# Patient Record
Sex: Female | Born: 1950 | Race: White | Hispanic: No | Marital: Married | Smoking: Former smoker
Health system: Southern US, Community
[De-identification: ages and names within clinical notes are randomized; demographics above are authoritative.]

## PROBLEM LIST (undated history)

## (undated) ENCOUNTER — Emergency Department: Discharge status: Absent without leave | Payer: Medicare Other

## (undated) DIAGNOSIS — R002 Palpitations: Secondary | ICD-10-CM

## (undated) DIAGNOSIS — J189 Pneumonia, unspecified organism: Secondary | ICD-10-CM

## (undated) DIAGNOSIS — I10 Essential (primary) hypertension: Secondary | ICD-10-CM

## (undated) DIAGNOSIS — E669 Obesity, unspecified: Secondary | ICD-10-CM

## (undated) DIAGNOSIS — I4891 Unspecified atrial fibrillation: Secondary | ICD-10-CM

## (undated) DIAGNOSIS — E039 Hypothyroidism, unspecified: Secondary | ICD-10-CM

## (undated) DIAGNOSIS — I499 Cardiac arrhythmia, unspecified: Secondary | ICD-10-CM

## (undated) DIAGNOSIS — J45909 Unspecified asthma, uncomplicated: Secondary | ICD-10-CM

## (undated) DIAGNOSIS — E785 Hyperlipidemia, unspecified: Secondary | ICD-10-CM

## (undated) HISTORY — PX: TONSILLECTOMY: SUR1361

## (undated) HISTORY — DX: Hyperlipidemia, unspecified: E78.5

## (undated) HISTORY — DX: Unspecified atrial fibrillation: I48.91

## (undated) HISTORY — PX: DILATION AND CURETTAGE OF UTERUS: SHX78

## (undated) HISTORY — PX: ANKLE FRACTURE SURGERY: SHX122

## (undated) HISTORY — DX: Hypothyroidism, unspecified: E03.9

## (undated) HISTORY — PX: TUBAL LIGATION: SHX77

## (undated) HISTORY — DX: Essential (primary) hypertension: I10

## (undated) HISTORY — DX: Obesity, unspecified: E66.9

## (undated) HISTORY — DX: Unspecified asthma, uncomplicated: J45.909

## (undated) HISTORY — PX: FRACTURE SURGERY: SHX138

## (undated) HISTORY — PX: ABDOMINAL HYSTERECTOMY: SHX81

## (undated) HISTORY — DX: Palpitations: R00.2

---

## 2010-04-04 ENCOUNTER — Encounter: Admission: RE | Admit: 2010-04-04 | Discharge: 2010-04-04 | Payer: Self-pay | Admitting: *Deleted

## 2010-04-21 ENCOUNTER — Encounter: Admission: RE | Admit: 2010-04-21 | Discharge: 2010-04-21 | Payer: Self-pay | Admitting: *Deleted

## 2010-10-08 ENCOUNTER — Other Ambulatory Visit
Admission: RE | Admit: 2010-10-08 | Discharge: 2010-10-08 | Payer: Self-pay | Source: Home / Self Care | Admitting: Internal Medicine

## 2012-03-04 ENCOUNTER — Encounter (HOSPITAL_COMMUNITY): Payer: Self-pay | Admitting: Pharmacy Technician

## 2012-03-04 ENCOUNTER — Other Ambulatory Visit: Payer: Self-pay | Admitting: Cardiology

## 2012-03-07 NOTE — H&P (Signed)
Office Visit     Patient: Carrie Potter, Carrie Potter Provider: Donielle Radziewicz, MD  DOB: 05/12/1951 Age: 61 Y Sex: Female Date: 03/03/2012  Phone: [phone]   Address: [address], [place], [place]  Pcp: CAMMIE FULP       Subjective:     CC:    1. 3WK FOLLOW UP.        HPI:  General:  61 year old that I saw originally in 2011 with palpitations after helping her child move into [employer] dorm room, dehydrated. She felt terrible with this prior episode. Holter and ECHO were reassuring. Today she sees me after being diagnosed with atrial fibrillatoin for the first time. ECG personally viewed from 02/09/12 shows afib. Today on exam she is in afib. Exacerbating factors include allergies after working in the garden as well as Sudafed. No CP, no SOB, no fevers. In fact, she has no palpitations currently. Prior episode in 2011 was described once again as fast rapid HR. (Likely an AFIB RVR). No DM, no CVA, no signif. ETOH, no PVD, normal EF, No CHF. +Female and HTN (score of 2 for CHADS-VAS). On 5/29 - placed her on Xarelto in anticapation of DC CV in one month. Gave her option of TEE/CV but since no symptoms will wait. Rationale is to give her a chance at NSR. Also started dilt CD 120mg.  On March 03, 2012-he continues to be in atrial fibrillation on auscultation. She's taking her medications well without any complaints. We discussed cardioversion. I discussed the pros and cons. We discussed that ablation is usually left a symptomatic atrial fibrillation. She's not interested in ablation. we decided to proceed with cardioversion. She is asymptomatic. She does feel some short windedness when walking up stairs..        ROS:  No bleeding problems, no syncope, no prior issues with anesthesia.       Medical History: hypothyroidism S/Potter RAI for nodule - endocrine, Asthma, HTN, Hyperlipidemia.        Surgical History: hysterectomy 2003, right ankle 2001, Hysterectomy , Tonsilectomy , D & C x 2 .       Family History: Father: deceased 80 yrs COPD, heart disease - rhythm issues 58, pacemaker, Skin Cancer Mother: deceased 83 yrs breast cancer, HTN, Borderline Diabetes, Skin Cancer Paternal Grand Father: deceased Natural Causes Paternal Grand Mother: deceased Natural Causes Maternal Grand Father: deceased Natural Causes Maternal Grand Mother: deceased Natural Causes Brother 1: alive No known medical problems Sister 1: alive COPD 11 brother(s) .        Social History:  General:  History of smoking cigarettes: Former smoker.  no Smoking, quit >35 years ago, college years ago.  no Tobacco Exposure.  Alcohol: yes, 1-2 per week, wine.  Caffeine: yes, 1-2 servings per day.  no Recreational drug use.  Exercise: yes, walks dog daily.  Occupation: employed, Coordinator.  Marital Status: married, Husband CNA.  Children: 3.        Medications: Detrol 2 MG Tablet 1 tablet Once a day, Lisinopril-Hydrochlorothiazide 20-12.5 MG Tablet 1 tablet Once a day, Levothyroxine Sodium 100 MCG Tablet 1 tablet Once a day, Valacyclovir HCl 1 GM Tablet 1 tablet q 12hrs prn, ProAir HFA 108 (90 Base) MCG/ACT Aerosol Solution 2 puffs as needed As needed, Xarelto 20 mg 20 tablet 1 tablet once a day, Cardizem CD 120 MG Capsule Extended Release 24 Hour 1 capsule Once a day, Medication List reviewed and reconciled with the patient         Allergies: penicillin: hives: Allergy.       Objective:     Vitals: Wt 226, Wt change 2.4 lb, Ht 63, BMI 40.03, Pulse sitting 68, BP sitting 112/70.       Examination:  Cardiology, General:  GENERAL APPEARANCE: pleasant, NAD.  HEENT: unremarkable.  HEART SOUNDS: irregularly irregular.  MURMUR: absent.  LUNGS: no rales or wheezes.  ABDOMEN: soft, non tender, positive bowel sounds, no masses felt.  EXTREMITIES: no leg edema.  PERIPHERAL PULSES: 2 plus bilateral.  Prior lab work reviewed.       Assessment:     Assessment:  1. Atrial fibrillation - 427.31 (Primary)  2.  Anticoagulant monitoring - V58.61    Plan:     1. Atrial fibrillation  LAB: Basic Metabolic    GLUCOSE 103  70-99 - mg/dL H   BUN 23  6-26 - mg/dL    CREATININE 1.03  0.60-1.30 - mg/dl    eGFR (NON-AFRICAN AMERICAN) 55 >60 - calc L   eGFR (AFRICAN AMERICAN) 66 >60 - calc    SODIUM 138  136-145 - mmol/L    POTASSIUM 4.9  3.5-5.5 - mmol/L    CHLORIDE 99  98-107 - mmol/L    C02 33  22-32 - mg/dL H   ANION GAP 10.8 6.0-20.0 - mmol/L    CALCIUM 10.0  8.6-10.3 - mg/dL     Demareon Coldwell 03/03/2012 02:38:34 PM > okay for cardioversion    LAB: CBC with Diff    WBC 9.0 4.0-11.0 - K/ul    RBC 4.01 4.20-5.40 - M/uL L   HGB 13.9 12.0-16.0 - g/dL    HCT 42.3 37.0-47.0 - %    MCH 34.7 27.0-33.0 - pg H   MPV 8.2 7.5-10.7 - fL    MCV 105.5 81.0-99.0 - fL H   MCHC 32.9 32.0-36.0 - g/dL    RDW 12.7 11.5-15.5 - %    NRBC# 0.00 -    PLT 221 150-400 - K/uL    NEUT % 66.5 43.3-71.9 - %    NRBC% 0.00 - %    LYMPH% 21.9 16.8-43.5 - %    MONO % 9.7 4.6-12.4 - %    EOS % 1.4 0.0-7.8 - %    BASO % 0.5 0.0-1.0 - %    NEUT # 6.0 1.9-7.2 - K/uL    LYMPH# 2.00 1.10-2.70 - K/uL    MONO # 0.9 0.3-0.8 - K/uL H   EOS # 0.1 0.0-0.6 - K/uL    BASO # 0.0 0.0-0.1 - K/uL     Tearsa Kowalewski 03/03/2012 02:38:34 PM > okay for cardioversion    LAB: PT (Prothrombin Time) (005199) normal    Prothrombin Time 12.0 9.1-12.0 - SEC    INR 1.1 0.8-1.2 -     Renn Dirocco 03/07/2012 12:55:00 PM > Xarelto     We will go ahead and proceed with cardioversion. Risks and benefits of the procedure including arrhythmia have been discussed and or approximate one and 10,000. Hopefully with restoration of normal rhythm we will improve atrial kick. At cardioversion is not successful, she truly seems symptom-free with her atrial fibrillation. My rationale is that I would like to at least give her an opportunity at restoration of sinus rhythm given her young age to prevent remodeling.        2. Anticoagulant monitoring  Tolerating  anticoagulation well.        Immunizations:        Labs:        Procedure Codes: 80048   ECL BMP, 85025 ECL CBC PLATELET DIFF, 36415 BLOOD COLLECTION ROUTINE VENIPUNCTURE       Preventive:         Follow Up: post cardioversion      Provider: Nicey Krah, MD  Patient: Yano, Wylee Potter DOB: 04/08/1951 Date: 03/03/2012    

## 2012-03-08 ENCOUNTER — Ambulatory Visit (HOSPITAL_COMMUNITY): Payer: BC Managed Care – PPO | Admitting: Anesthesiology

## 2012-03-08 ENCOUNTER — Ambulatory Visit (HOSPITAL_COMMUNITY)
Admission: RE | Admit: 2012-03-08 | Discharge: 2012-03-08 | Disposition: A | Discharge status: Home / Self Care | Payer: BC Managed Care – PPO | Source: Ambulatory Visit | Attending: Cardiology | Admitting: Cardiology

## 2012-03-08 ENCOUNTER — Encounter (HOSPITAL_COMMUNITY): Payer: Self-pay | Admitting: Anesthesiology

## 2012-03-08 ENCOUNTER — Encounter (HOSPITAL_COMMUNITY)
Admission: RE | Disposition: A | Discharge status: Home / Self Care | Payer: Self-pay | Source: Ambulatory Visit | Attending: Cardiology

## 2012-03-08 DIAGNOSIS — J45909 Unspecified asthma, uncomplicated: Secondary | ICD-10-CM | POA: Insufficient documentation

## 2012-03-08 DIAGNOSIS — K219 Gastro-esophageal reflux disease without esophagitis: Secondary | ICD-10-CM | POA: Insufficient documentation

## 2012-03-08 DIAGNOSIS — E039 Hypothyroidism, unspecified: Secondary | ICD-10-CM | POA: Insufficient documentation

## 2012-03-08 DIAGNOSIS — I4891 Unspecified atrial fibrillation: Secondary | ICD-10-CM | POA: Insufficient documentation

## 2012-03-08 DIAGNOSIS — E785 Hyperlipidemia, unspecified: Secondary | ICD-10-CM | POA: Insufficient documentation

## 2012-03-08 DIAGNOSIS — I10 Essential (primary) hypertension: Secondary | ICD-10-CM | POA: Insufficient documentation

## 2012-03-08 HISTORY — PX: CARDIOVERSION: SHX1299

## 2012-03-08 SURGERY — CARDIOVERSION
Anesthesia: General | Wound class: Clean

## 2012-03-08 MED ORDER — PROPOFOL 10 MG/ML IV EMUL
INTRAVENOUS | Status: DC | PRN
  Administered 2012-03-08: 100 mg via INTRAVENOUS

## 2012-03-08 MED ORDER — SODIUM CHLORIDE 0.9 % IV SOLN
INTRAVENOUS | Status: DC | PRN
  Administered 2012-03-08: 14:00:00 via INTRAVENOUS

## 2012-03-08 MED ORDER — LIDOCAINE HCL (CARDIAC) 20 MG/ML IV SOLN
INTRAVENOUS | Status: DC | PRN
  Administered 2012-03-08: 80 mg via INTRAVENOUS

## 2012-03-08 NOTE — H&P (View-Only) (Signed)
Office Visit     Patient: Carrie Potter, Carrie Potter Provider: Donato Schultz, MD  DOB: 1950-12-03 Age: 61 Y Sex: Female Date: 03/03/2012  Phone: [phone]   Address: [address] [place], [place], [place]  Pcp: CAMMIE FULP       Subjective:     CC:    1. 3WK FOLLOW UP.        HPI:  General:  61 year old that I saw originally in 2011 with palpitations after helping her child move into [facility] dorm room, dehydrated. She felt terrible with this prior episode. Holter and ECHO were reassuring. Today she sees me after being diagnosed with atrial fibrillatoin for the first time. ECG personally viewed from 02/09/12 shows afib. Today on exam she is in afib. Exacerbating factors include allergies after working in the garden as well as Sudafed. No CP, no SOB, no fevers. In fact, she has no palpitations currently. Prior episode in 2011 was described once again as fast rapid HR. (Likely an AFIB RVR). No DM, no CVA, no signif. ETOH, no PVD, normal EF, No CHF. +Female and HTN (score of 2 for CHADS-VAS). On 5/29 - placed her on Xarelto in anticapation of DC CV in one month. Gave her option of TEE/CV but since no symptoms will wait. Rationale is to give her a chance at NSR. Also started dilt CD 120mg .  On March 03, 2012-he continues to be in atrial fibrillation on auscultation. She's taking her medications well without any complaints. We discussed cardioversion. I discussed the pros and cons. We discussed that ablation is usually left a symptomatic atrial fibrillation. She's not interested in ablation. we decided to proceed with cardioversion. She is asymptomatic. She does feel some short windedness when walking up stairs..        ROS:  No bleeding problems, no syncope, no prior issues with anesthesia.       Medical History: hypothyroidism S/P RAI for nodule - endocrine, Asthma, HTN, Hyperlipidemia.        Surgical History: hysterectomy 2003, right ankle 2001, Hysterectomy , Tonsilectomy , D & C x 2 .       Family History: Father: deceased 14 yrs COPD, heart disease - rhythm issues 43, pacemaker, Skin Cancer Mother: deceased 11 yrs breast cancer, HTN, Borderline Diabetes, Skin Cancer Paternal Grand Father: deceased Natural Causes Paternal Grand Mother: deceased Natural Causes Maternal Grand Father: deceased Natural Causes Maternal Grand Mother: deceased Natural Causes Brother 1: alive No known medical problems Sister 1: alive COPD 11 brother(s) .        Social History:  General:  History of smoking cigarettes: Former smoker.  no Smoking, quit >35 years ago, college years ago.  no Tobacco Exposure.  Alcohol: yes, 1-2 per week, wine.  Caffeine: yes, 1-2 servings per day.  no Recreational drug use.  Exercise: yes, walks dog daily.  Occupation: employed, Nurse, adult.  Marital Status: married, Husband CNA.  Children: 3.        Medications: Detrol 2 MG Tablet 1 tablet Once a day, Lisinopril-Hydrochlorothiazide 20-12.5 MG Tablet 1 tablet Once a day, Levothyroxine Sodium 100 MCG Tablet 1 tablet Once a day, Valacyclovir HCl 1 GM Tablet 1 tablet q 12hrs prn, ProAir HFA 108 (90 Base) MCG/ACT Aerosol Solution 2 puffs as needed As needed, Xarelto 20 mg 20 tablet 1 tablet once a day, Cardizem CD 120 MG Capsule Extended Release 24 Hour 1 capsule Once a day, Medication List reviewed and reconciled with the patient  Allergies: penicillin: hives: Allergy.       Objective:     Vitals: Wt 226, Wt change 2.4 lb, Ht 63, BMI 40.03, Pulse sitting 68, BP sitting 112/70.       Examination:  Cardiology, General:  GENERAL APPEARANCE: pleasant, NAD.  HEENT: unremarkable.  HEART SOUNDS: irregularly irregular.  MURMUR: absent.  LUNGS: no rales or wheezes.  ABDOMEN: soft, non tender, positive bowel sounds, no masses felt.  EXTREMITIES: no leg edema.  PERIPHERAL PULSES: 2 plus bilateral.  Prior lab work reviewed.       Assessment:     Assessment:  1. Atrial fibrillation - 427.31 (Primary)  2.  Anticoagulant monitoring - V58.61    Plan:     1. Atrial fibrillation  LAB: Basic Metabolic    GLUCOSE 103  70-99 - mg/dL H   BUN 23  1-61 - mg/dL    CREATININE 0.96  0.45-4.09 - mg/dl    eGFR (NON-AFRICAN AMERICAN) 55 >60 - calc L   eGFR (AFRICAN AMERICAN) 66 >60 - calc    SODIUM 138  136-145 - mmol/L    POTASSIUM 4.9  3.5-5.5 - mmol/L    CHLORIDE 99  98-107 - mmol/L    C02 33  22-32 - mg/dL H   ANION GAP 81.1 9.1-47.8 - mmol/L    CALCIUM 10.0  8.6-10.3 - mg/dL     Carrie Potter 29/56/2130 02:38:34 PM > okay for cardioversion    LAB: CBC with Diff    WBC 9.0 4.0-11.0 - K/ul    RBC 4.01 4.20-5.40 - M/uL L   HGB 13.9 12.0-16.0 - g/dL    HCT 86.5 78.4-69.6 - %    MCH 34.7 27.0-33.0 - pg H   MPV 8.2 7.5-10.7 - fL    MCV 105.5 81.0-99.0 - fL H   MCHC 32.9 32.0-36.0 - g/dL    RDW 29.5 28.4-13.2 - %    NRBC# 0.00 -    PLT 221 150-400 - K/uL    NEUT % 66.5 43.3-71.9 - %    NRBC% 0.00 - %    LYMPH% 21.9 16.8-43.5 - %    MONO % 9.7 4.6-12.4 - %    EOS % 1.4 0.0-7.8 - %    BASO % 0.5 0.0-1.0 - %    NEUT # 6.0 1.9-7.2 - K/uL    LYMPH# 2.00 1.10-2.70 - K/uL    MONO # 0.9 0.3-0.8 - K/uL H   EOS # 0.1 0.0-0.6 - K/uL    BASO # 0.0 0.0-0.1 - K/uL     Carrie Potter 03/03/2012 02:38:34 PM > okay for cardioversion    LAB: PT (Prothrombin Time) (440102) normal    Prothrombin Time 12.0 9.1-12.0 - SEC    INR 1.1 0.8-1.2 -     Carrie Potter 03/07/2012 12:55:00 PM > Xarelto     We will go ahead and proceed with cardioversion. Risks and benefits of the procedure including arrhythmia have been discussed and or approximate one and 10,000. Hopefully with restoration of normal rhythm we will improve atrial kick. At cardioversion is not successful, she truly seems symptom-free with her atrial fibrillation. My rationale is that I would like to at least give her an opportunity at restoration of sinus rhythm given her young age to prevent remodeling.        2. Anticoagulant monitoring  Tolerating  anticoagulation well.        Immunizations:        Labs:        Procedure Codes: 72536  ECL BMP, 85025 ECL CBC PLATELET DIFF, 16109 BLOOD COLLECTION ROUTINE VENIPUNCTURE       Preventive:         Follow Up: post cardioversion      Provider: Donato Schultz, MD  Patient: Carrie Potter, Carrie Potter DOB: 29-May-1951 Date: 03/03/2012

## 2012-03-08 NOTE — Anesthesia Preprocedure Evaluation (Addendum)
Anesthesia Evaluation  Patient identified by MRN, date of birth, ID band Patient awake    Reviewed: Allergy & Precautions, H&P , NPO status , Patient's Chart, lab work & pertinent test results  Airway Mallampati: II TM Distance: >3 FB     Dental  (+) Teeth Intact   Pulmonary neg pulmonary ROS,    Pulmonary exam normal       Cardiovascular Exercise Tolerance: Good hypertension, Pt. on medications Rhythm:Irregular Rate:Abnormal + Systolic murmurs    Neuro/Psych negative neurological ROS  negative psych ROS   GI/Hepatic Neg liver ROS, GERD-  Medicated and Controlled,  Endo/Other  negative endocrine ROS  Renal/GU negative Renal ROS  negative genitourinary   Musculoskeletal negative musculoskeletal ROS (+)   Abdominal Normal abdominal exam  (+)   Peds  Hematology negative hematology ROS (+)   Anesthesia Other Findings   Reproductive/Obstetrics negative OB ROS                          Anesthesia Physical Anesthesia Plan  ASA: II  Anesthesia Plan: General   Post-op Pain Management:    Induction: Intravenous  Airway Management Planned: Mask  Additional Equipment:   Intra-op Plan:   Post-operative Plan:   Informed Consent:   Dental advisory given  Plan Discussed with: CRNA  Anesthesia Plan Comments:         Anesthesia Quick Evaluation

## 2012-03-08 NOTE — CV Procedure (Signed)
Electrical Cardioversion Procedure Note Sumaya Riedesel [id number] November 21, 1950  Procedure: Electrical Cardioversion Indications:  AFIB  Time Out: Verified patient identification, verified procedure,medications/allergies/relevent history reviewed, required imaging and test results available. Time out done.   Procedure Details  The patient was NPO after midnight. Anesthesia was administered at the beside  by Dr.Singer with 100mg  of propofol.  Cardioversion was performed with synchronized biphasic defibrillation via AP pads with 150 joules.  1 attempt(s) were performed.  The patient converted to normal sinus rhythm. Transient 5 second post conversion pause. Brief PAT. Now SR, SB. 60The patient tolerated the procedure well   IMPRESSION:  Successful cardioversion of atrial fibrillation    Eddi Hymes 03/08/2012, 2:36 PM

## 2012-03-08 NOTE — Transfer of Care (Signed)
Immediate Anesthesia Transfer of Care Note  Patient: Carrie Potter  Procedure(s) Performed: Procedure(s) (LRB): CARDIOVERSION (N/A)  Patient Location: PACU  Anesthesia Type: General  Level of Consciousness: awake, alert , oriented and patient cooperative  Airway & Oxygen Therapy: Patient Spontanous Breathing and Patient connected to nasal cannula oxygen  Post-op Assessment: Report given to PACU RN and Post -op Vital signs reviewed and stable  Post vital signs: Reviewed and stable  Complications: No apparent anesthesia complications

## 2012-03-08 NOTE — Anesthesia Postprocedure Evaluation (Signed)
  Anesthesia Post-op Note  Patient: Carrie Potter  Procedure(s) Performed: Procedure(s) (LRB): CARDIOVERSION (N/A)  Patient Location: PACU  Anesthesia Type: General  Level of Consciousness: awake, alert , oriented and patient cooperative  Airway and Oxygen Therapy: Patient Spontanous Breathing  Post-op Pain: none  Post-op Assessment: Post-op Vital signs reviewed and Patient's Cardiovascular Status Stable  Post-op Vital Signs: Reviewed and stable  Complications: No apparent anesthesia complications

## 2012-03-08 NOTE — Discharge Instructions (Signed)
Electrical Cardioversion Cardioversion is the delivery of a jolt of electricity to change the rhythm of the heart. Sticky patches or metal paddles are placed on the chest to deliver the electricity from a special device. This is done to restore a normal rhythm. A rhythm that is too fast or not regular keeps the heart from pumping well. Compared to medicines used to change an abnormal rhythm, cardioversion is faster and works better. It is also unpleasant and may dislodge blood clots from the heart. WHEN WOULD THIS BE DONE?  In an emergency:   There is low or no blood pressure as a result of the heart rhythm.   Normal rhythm must be restored as fast as possible to protect the brain and heart from further damage.   It may save a life.   For less serious heart rhythms, such as atrial fibrillation or flutter, in which:   The heart is beating too fast or is not regular.   The heart is still able to pump enough blood, but not as well as it should.   Medicine to change the rhythm has not worked.   It is safe to wait in order to allow time for preparation.  LET YOUR CAREGIVER KNOW ABOUT:   Every medicine you are taking. It is very important to do this! Know when to take or stop taking any of them.   Any time in the past that you have felt your heart was not beating normally.  RISKS AND COMPLICATIONS   Clots may form in the chambers of the heart if it is beating too fast. These clots may be dislodged during the procedure and travel to other parts of the body.   There is risk of a stroke during and after the procedure if a clot moves. Blood thinners lower this risk.   You may have a special test of your heart (TEE) to make sure there are no clots in your heart.  BEFORE THE PROCEDURE   You may have some tests to see how well your heart is working.   You may start taking blood thinners so your blood does not clot as easily.   Other drugs may be given to help your heart work better.   PROCEDURE (SCHEDULED)  The procedure is typically done in a hospital by a heart doctor (cardiologist).   You will be told when and where to go.   You may be given some medicine through an intravenous (IV) access to reduce discomfort and make you sleepy before the procedure.   Your whole body may move when the shock is delivered. Your chest may feel sore.   You may be able to go home after a few hours. Your heart rhythm will be watched to make sure it does not change.  HOME CARE INSTRUCTIONS   Only take medicine as directed by your caregiver. Be sure you understand how and when to take your medicine.   Learn how to feel your pulse and check it often.   Limit your activity for 48 hours.   Avoid caffeine and other stimulants as directed.  SEEK MEDICAL CARE IF:   You feel like your heart is beating too fast or your pulse is not regular.   You have any questions about your medicines.   You have bleeding that will not stop.  SEEK IMMEDIATE MEDICAL CARE IF:   You are dizzy or feel faint.   It is hard to breathe or you feel short of breath.     There is a change in discomfort in your chest.   Your speech is slurred or you have trouble moving your arm or leg on one side.   You get a muscle cramp.   Your fingers or toes turn cold or blue.  MAKE SURE YOU:   Understand these instructions.   Will watch your condition.   Will get help right away if you are not doing well or get worse.  Document Released: 08/21/2002 Document Revised: 08/20/2011 Document Reviewed: 12/21/2007 Exit[employer] Patient Information 2012 [employer]. 

## 2012-03-08 NOTE — Preoperative (Signed)
Beta Blockers   Reason not to administer Beta Blockers:Not Applicable 

## 2012-03-08 NOTE — Interval H&P Note (Signed)
History and Physical Interval Note:  03/08/2012 2:28 PM  Carrie Potter  has presented today for surgery, with the diagnosis of AFIB  The various methods of treatment have been discussed with the patient and family. After consideration of risks, benefits and other options for treatment, the patient has consented to  Procedure(s) (LRB): CARDIOVERSION (N/A) as a surgical intervention .  The patient's history has been reviewed, patient examined, no change in status, stable for surgery.  I have reviewed the patients' chart and labs.  Questions were answered to the patient's satisfaction.     Carrie Potter

## 2012-03-10 ENCOUNTER — Encounter (HOSPITAL_COMMUNITY): Payer: Self-pay | Admitting: Cardiology

## 2012-07-29 ENCOUNTER — Other Ambulatory Visit: Payer: Self-pay | Admitting: Family Medicine

## 2012-07-29 DIAGNOSIS — Z1231 Encounter for screening mammogram for malignant neoplasm of breast: Secondary | ICD-10-CM

## 2012-08-25 ENCOUNTER — Ambulatory Visit
Admission: RE | Admit: 2012-08-25 | Discharge: 2012-08-25 | Disposition: A | Discharge status: Home / Self Care | Payer: BC Managed Care – PPO | Source: Ambulatory Visit | Attending: Family Medicine | Admitting: Family Medicine

## 2012-08-25 DIAGNOSIS — Z1231 Encounter for screening mammogram for malignant neoplasm of breast: Secondary | ICD-10-CM

## 2013-05-26 ENCOUNTER — Other Ambulatory Visit: Payer: Self-pay | Admitting: Family

## 2013-05-26 DIAGNOSIS — J392 Other diseases of pharynx: Secondary | ICD-10-CM

## 2013-06-01 ENCOUNTER — Ambulatory Visit
Admission: RE | Admit: 2013-06-01 | Discharge: 2013-06-01 | Disposition: A | Discharge status: Home / Self Care | Payer: BC Managed Care – PPO | Source: Ambulatory Visit | Attending: Family | Admitting: Family

## 2013-06-01 DIAGNOSIS — J392 Other diseases of pharynx: Secondary | ICD-10-CM

## 2013-06-19 ENCOUNTER — Other Ambulatory Visit: Payer: Self-pay | Admitting: Otolaryngology

## 2013-06-19 DIAGNOSIS — E042 Nontoxic multinodular goiter: Secondary | ICD-10-CM

## 2013-06-22 ENCOUNTER — Other Ambulatory Visit (HOSPITAL_COMMUNITY)
Admission: RE | Admit: 2013-06-22 | Discharge: 2013-06-22 | Disposition: A | Discharge status: Home / Self Care | Payer: BC Managed Care – PPO | Source: Ambulatory Visit | Attending: Interventional Radiology | Admitting: Interventional Radiology

## 2013-06-22 ENCOUNTER — Ambulatory Visit
Admission: RE | Admit: 2013-06-22 | Discharge: 2013-06-22 | Disposition: A | Discharge status: Home / Self Care | Payer: BC Managed Care – PPO | Source: Ambulatory Visit | Attending: Otolaryngology | Admitting: Otolaryngology

## 2013-06-22 DIAGNOSIS — E042 Nontoxic multinodular goiter: Secondary | ICD-10-CM

## 2013-06-22 DIAGNOSIS — E041 Nontoxic single thyroid nodule: Secondary | ICD-10-CM | POA: Insufficient documentation

## 2013-07-09 ENCOUNTER — Encounter: Payer: Self-pay | Admitting: Cardiology

## 2013-07-09 ENCOUNTER — Encounter: Payer: Self-pay | Admitting: *Deleted

## 2013-07-09 DIAGNOSIS — R002 Palpitations: Secondary | ICD-10-CM | POA: Insufficient documentation

## 2013-07-09 DIAGNOSIS — I4891 Unspecified atrial fibrillation: Secondary | ICD-10-CM | POA: Insufficient documentation

## 2013-07-09 DIAGNOSIS — I10 Essential (primary) hypertension: Secondary | ICD-10-CM | POA: Insufficient documentation

## 2013-07-09 DIAGNOSIS — E039 Hypothyroidism, unspecified: Secondary | ICD-10-CM | POA: Insufficient documentation

## 2013-07-09 DIAGNOSIS — E669 Obesity, unspecified: Secondary | ICD-10-CM | POA: Insufficient documentation

## 2013-07-10 ENCOUNTER — Encounter: Payer: Self-pay | Admitting: Cardiology

## 2013-07-10 ENCOUNTER — Ambulatory Visit (INDEPENDENT_AMBULATORY_CARE_PROVIDER_SITE_OTHER): Payer: BC Managed Care – PPO | Admitting: Cardiology

## 2013-07-10 VITALS — BP 110/78 | HR 78 | Ht 64.0 in | Wt 210.0 lb

## 2013-07-10 DIAGNOSIS — E669 Obesity, unspecified: Secondary | ICD-10-CM

## 2013-07-10 DIAGNOSIS — E785 Hyperlipidemia, unspecified: Secondary | ICD-10-CM

## 2013-07-10 DIAGNOSIS — I10 Essential (primary) hypertension: Secondary | ICD-10-CM

## 2013-07-10 DIAGNOSIS — R002 Palpitations: Secondary | ICD-10-CM

## 2013-07-10 DIAGNOSIS — I4891 Unspecified atrial fibrillation: Secondary | ICD-10-CM

## 2013-07-10 MED ORDER — RIVAROXABAN 20 MG PO TABS: 20.0000 mg | ORAL_TABLET | Freq: Every day | ORAL | Status: DC

## 2013-07-10 NOTE — Patient Instructions (Addendum)
Your physician wants you to follow-up in: 6 months with Dr. Anne Fu. You will receive a reminder letter in the mail two months in advance. If you don't receive a letter, please call our office to schedule the follow-up appointment.  Your physician recommends that you return for lab work on 08/14/13.  Your physician has recommended you make the following change in your medication:  1. Start back taking Xarelto 20mg  1 tablet by mouth daily. #20 Samples and discount cards given to pt.   2. Avoid NSAIDS and Aspirin.

## 2013-07-10 NOTE — Progress Notes (Signed)
[address], [address] [place], [place]  [address] Phone: [phone] Fax:  [phone]  Date:  07/10/2013   ID:  Carrie Potter, DOB [birth year], MRN [id number]  PCP:  No primary provider on file.   History of Present Illness: Carrie Potter is a 62 y.o. female history of atrial fibrillation. Diagnosed 02/09/12 showing A. fib. Score of 2 CHADS-VAS. She was placed on Xarelto. Had successful cardioversion on 03/08/12 however shortly thereafter reverted back to atrial fibrillation. No change in symptoms. We decided to continue with rate control strategy. We once again had lengthy discussion about anticoagulation and its importance.  Echocardiogram 05/27/10-normal EF, mild to moderate left atrial enlargement LA size 4.9 cm.  She truly is feeling asymptomatic in regards to her atrial fibrillation.   Wt Readings from Last 3 Encounters:  07/10/13 210 lb (95.255 kg)  03/08/12 240 lb (108.863 kg)  03/08/12 240 lb (108.863 kg)     Past Medical History  Diagnosis Date  . HTN (hypertension)   . Asthma   . Hyperlipidemia   . Hypothyroidism   . Obesity   . Palpitation   . Atrial fibrillation     Past Surgical History  Procedure Laterality Date  . Cardioversion  03/08/2012    Procedure: CARDIOVERSION;  Surgeon: Donato Schultz, MD;  Location: [facility] OR;  Service: Cardiovascular;  Laterality: N/A;  . Tonsillectomy    . Dilation and curettage of uterus      Current Outpatient Prescriptions  Medication Sig Dispense Refill  . acetaminophen (TYLENOL) 500 MG tablet Take 500-1,000 mg by mouth every 6 (six) hours as needed. For pain      . albuterol (PROVENTIL HFA;VENTOLIN HFA) 108 (90 BASE) MCG/ACT inhaler Inhale 2 puffs into the lungs every 4 (four) hours as needed. Shortness of breath      . CARTIA XT 120 MG 24 hr capsule       . diltiazem (DILACOR XR) 120 MG 24 hr capsule Take 120 mg by mouth daily.      . hydrochlorothiazide (MICROZIDE) 12.5 MG capsule Take 12.5 mg by mouth daily.        Marland Kitchen levothyroxine (SYNTHROID, LEVOTHROID) 100 MCG tablet Take 100 mcg by mouth daily.      . Pediatric Multivit-Minerals-C (RA GUMMY VITAMINS & MINERALS PO) Take 1 tablet by mouth daily.      . Rivaroxaban (XARELTO) 20 MG TABS Take 1 tablet by mouth daily.      . rosuvastatin (CRESTOR) 5 MG tablet Take 5 mg by mouth daily.      Marland Kitchen tolterodine (DETROL) 2 MG tablet Take 2 mg by mouth daily.      . valACYclovir (VALTREX) 1000 MG tablet Take 1,000 mg by mouth 2 (two) times daily as needed. Cold sore       No current facility-administered medications for this visit.    Allergies:    Allergies  Allergen Reactions  . Lisinopril-Hydrochlorothiazide     Throat swelling   . Penicillins Other (See Comments)    whelps    Social History:  The patient  reports that she quit smoking about 29 years ago. Her smoking use included Cigarettes. She smoked 1.00 pack per day. She does not have any smokeless tobacco history on file.   ROS:  Please see the history of present illness.   Denies any strokelike symptoms, no bleeding, no syncope, no orthopnea    PHYSICAL EXAM: VS:  BP 110/78  Pulse 78  Ht  5\' 4"  (1.626 m)  Wt 210 lb (95.255 kg)  BMI 36.03 kg/m2 Well nourished, well developed, in no acute distress HEENT: normal Neck: no JVD Cardiac: Irregularly irregular, normal rate; no murmur Lungs:  clear to auscultation bilaterally, no wheezing, rhonchi or rales Abd: soft, nontender, no hepatomegalyObese Ext: no edema Skin: warm and dry Neuro: no focal abnormalities noted  EKG:  Heart rate 60, atrial fibrillation 12/21/12     ASSESSMENT AND PLAN:  1. persistent atrial fibrillation-we will continue with rate control strategy. Cardioversion only successful for a few days. 2. Chronic anticoagulation-encouraged use of anticoagulant. Reviewed scoring mechanism. 3. Obesity-encourage weight loss. Over the past year, 30 pound weight loss. Excellent.. 4. Hypertension-currently well controlled. 5.  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 LDL was 202 at last check. She is currently taking Crestor 5 mg once a day. I applauded her use of this medication. She is having this monitored by her primary physician. 6. we will see her back in one month for lab work only. Hemoglobin, creatinine. I will see her back in 6 months or sooner if necessary.  Signed, Donato Schultz, MD [facility]  07/10/2013 3:58 PM

## 2013-08-14 ENCOUNTER — Other Ambulatory Visit (INDEPENDENT_AMBULATORY_CARE_PROVIDER_SITE_OTHER): Payer: BC Managed Care – PPO

## 2013-08-14 DIAGNOSIS — I4891 Unspecified atrial fibrillation: Secondary | ICD-10-CM

## 2013-08-14 LAB — CBC
HCT: 37.8 % (ref 36.0–46.0)
Hemoglobin: 12.6 g/dL (ref 12.0–15.0)
MCHC: 33.2 g/dL (ref 30.0–36.0)
MCV: 102.3 fl — ABNORMAL HIGH (ref 78.0–100.0)
Platelets: 212 K/uL (ref 150.0–400.0)
RBC: 3.7 Mil/uL — ABNORMAL LOW (ref 3.87–5.11)
RDW: 13.7 % (ref 11.5–14.6)
WBC: 8.4 K/uL (ref 4.5–10.5)

## 2013-08-16 ENCOUNTER — Telehealth: Payer: Self-pay | Admitting: Cardiology

## 2013-08-16 NOTE — Telephone Encounter (Signed)
Reviewed lab results with patient who verbalized understanding 

## 2013-08-16 NOTE — Telephone Encounter (Signed)
Follow Up  ° °Pt returned call for results °

## 2013-12-06 ENCOUNTER — Other Ambulatory Visit: Payer: Self-pay | Admitting: Otolaryngology

## 2013-12-06 DIAGNOSIS — D497 Neoplasm of unspecified behavior of endocrine glands and other parts of nervous system: Secondary | ICD-10-CM

## 2014-01-02 ENCOUNTER — Ambulatory Visit
Admission: RE | Admit: 2014-01-02 | Discharge: 2014-01-02 | Disposition: A | Discharge status: Home / Self Care | Payer: BC Managed Care – PPO | Source: Ambulatory Visit | Attending: Family Medicine | Admitting: Family Medicine

## 2014-01-02 ENCOUNTER — Other Ambulatory Visit: Payer: Self-pay | Admitting: Family Medicine

## 2014-01-02 DIAGNOSIS — R0602 Shortness of breath: Secondary | ICD-10-CM

## 2014-01-12 ENCOUNTER — Encounter: Payer: Self-pay | Admitting: Cardiology

## 2014-01-15 ENCOUNTER — Encounter: Payer: Self-pay | Admitting: Cardiology

## 2014-01-24 ENCOUNTER — Encounter: Payer: Self-pay | Admitting: Cardiology

## 2014-01-24 ENCOUNTER — Ambulatory Visit (INDEPENDENT_AMBULATORY_CARE_PROVIDER_SITE_OTHER): Payer: BC Managed Care – PPO | Admitting: Cardiology

## 2014-01-24 VITALS — BP 164/88 | HR 87 | Ht 64.0 in | Wt 222.8 lb

## 2014-01-24 DIAGNOSIS — I4891 Unspecified atrial fibrillation: Secondary | ICD-10-CM

## 2014-01-24 DIAGNOSIS — E669 Obesity, unspecified: Secondary | ICD-10-CM

## 2014-01-24 DIAGNOSIS — R0989 Other specified symptoms and signs involving the circulatory and respiratory systems: Secondary | ICD-10-CM

## 2014-01-24 DIAGNOSIS — Z7901 Long term (current) use of anticoagulants: Secondary | ICD-10-CM

## 2014-01-24 DIAGNOSIS — R0600 Dyspnea, unspecified: Secondary | ICD-10-CM | POA: Insufficient documentation

## 2014-01-24 DIAGNOSIS — R0609 Other forms of dyspnea: Secondary | ICD-10-CM

## 2014-01-24 MED ORDER — METOPROLOL SUCCINATE ER 25 MG PO TB24: 25.0000 mg | ORAL_TABLET | Freq: Every day | ORAL | Status: DC

## 2014-01-24 NOTE — Patient Instructions (Signed)
Medication changes:   STOP: Diltiazem (Cartia)                                    START:  Toprol xl 25mg  1 tablet daily  Your physician wants you to follow-up in: 6 months with Dr. Marlou Porch.  You will receive a reminder letter in the mail two months in advance. If you don't receive a letter, please call our office to schedule the follow-up appointment.

## 2014-01-24 NOTE — Progress Notes (Signed)
[place]. [address] [place], [place]  [address] Phone: [phone] Fax:  [phone]  Date:  01/24/2014   ID:  Carrie Potter, DOB Nov 30, 1950, MRN [id number]  PCP:  Antony Blackbird, MD   History of Present Illness: Carrie Potter is a 63 y.o. female history of atrial fibrillation. Diagnosed 02/09/12 showing A. fib. Score of 2 CHADS-VASc (F, HTN). She was placed on Xarelto. Had successful cardioversion on 03/08/12 however shortly thereafter reverted back to atrial fibrillation. No change in symptoms. We decided to continue with rate control strategy. We once again had lengthy discussion about anticoagulation and its importance.  Echocardiogram 05/27/10-normal EF, mild to moderate left atrial enlargement LA size 4.9 cm.  She truly is feeling asymptomatic at last visit in regards to her atrial fibrillation.  Now she states she has had more shortness of breath with activity. She has also gained approximately 15 pounds. She also noted some lower extremity swelling. She took Lasix for a few days and was out in the sun and noticed photosensitivity. She has subsequent to stop this. She has also been drinking several glasses of water a day. We discussed fluid restrictions.  Thyroid dose changed.    Wt Readings from Last 3 Encounters:  01/24/14 222 lb 12.8 oz (101.061 kg)  07/10/13 210 lb (95.255 kg)  03/08/12 240 lb (108.863 kg)     Past Medical History  Diagnosis Date  . HTN (hypertension)   . Asthma   . Hyperlipidemia   . Hypothyroidism   . Obesity   . Palpitation   . Atrial fibrillation     Past Surgical History  Procedure Laterality Date  . Cardioversion  03/08/2012    Procedure: CARDIOVERSION;  Surgeon: Candee Furbish, MD;  Location: [facility] OR;  Service: Cardiovascular;  Laterality: N/A;  . Tonsillectomy    . Dilation and curettage of uterus      Current Outpatient Prescriptions  Medication Sig Dispense Refill  . acetaminophen (TYLENOL) 500 MG tablet Take 500-1,000  mg by mouth every 6 (six) hours as needed. For pain      . albuterol (PROVENTIL HFA;VENTOLIN HFA) 108 (90 BASE) MCG/ACT inhaler Inhale 2 puffs into the lungs every 4 (four) hours as needed. Shortness of breath      . CARTIA XT 120 MG 24 hr capsule       . hydrochlorothiazide (MICROZIDE) 12.5 MG capsule Take 12.5 mg by mouth 2 (two) times daily.       Marland Kitchen levothyroxine (SYNTHROID, LEVOTHROID) 100 MCG tablet Take 100 mcg by mouth daily.      . Pediatric Multivit-Minerals-C (RA GUMMY VITAMINS & MINERALS PO) Take 1 tablet by mouth daily.      . Rivaroxaban (XARELTO) 20 MG TABS tablet Take 1 tablet (20 mg total) by mouth daily.  30 tablet  6  . tolterodine (DETROL) 2 MG tablet Take 2 mg by mouth daily.      . valACYclovir (VALTREX) 1000 MG tablet Take 1,000 mg by mouth 2 (two) times daily as needed. Cold sore       No current facility-administered medications for this visit.    Allergies:    Allergies  Allergen Reactions  . Lisinopril-Hydrochlorothiazide     Throat swelling   . Penicillins Other (See Comments)    whelps    Social History:  The patient  reports that she quit smoking about 29 years ago. Her smoking use included Cigarettes. She smoked 1.00 pack per day. She does  not have any smokeless tobacco history on file.   ROS:  Please see the history of present illness.   Denies any strokelike symptoms, no bleeding, no syncope, no orthopnea    PHYSICAL EXAM: VS:  BP 164/88  Pulse 87  Ht 5\' 4"  (1.626 m)  Wt 222 lb 12.8 oz (101.061 kg)  BMI 38.22 kg/m2 Well nourished, well developed, in no acute distress HEENT: normal Neck: no JVD Cardiac: Irregularly irregular, normal rate; no murmur Lungs:  clear to auscultation bilaterally, no wheezing, rhonchi or rales Abd: soft, nontender, no hepatomegalyObese Ext: no edema Skin: warm and dry Neuro: no focal abnormalities noted  EKG:  01/24/14-heart rate 87, atrial fibrillation, nonspecific ST changes. No change when compared to prior      ASSESSMENT AND PLAN:  1. Persistent atrial fibrillation-we will continue with rate control strategy. Cardioversion only successful for a few days. She felt no different after cardioversion. We will continue with rate control. She Is Having Some Dependent Edema on Low-Dose Diltiazem, I Will Stop This Medication and Start Her on Metoprolol Extended Release 25 Mg Once a Day. 2. Chronic anticoagulation-encouraged use of anticoagulant. Reviewed scoring mechanism. 3. Obesity-we discussed at length. Weight is back up to and 15 pounds. She is going to have some dependent edema secondary to this. She's also going to have some shortness of breath secondary to this. For the health of her heart, joints, advocated weight loss. Weight Watchers can be helpful.  4. Hypertension-previously well-controlled, mildly elevated today. Photosensitivity with Lasix. She is off. 5. 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 LDL was 202 at last check. She is currently taking Crestor 5 mg once a day. I applauded her use of this medication. She is having this monitored by her primary physician. 6. I will see her back in 6 months or sooner if necessary.  Signed, Candee Furbish, MD [facility]  01/24/2014 8:45 AM

## 2014-03-04 ENCOUNTER — Other Ambulatory Visit: Payer: Self-pay | Admitting: Cardiology

## 2014-03-21 ENCOUNTER — Other Ambulatory Visit: Payer: Self-pay | Admitting: Cardiology

## 2014-06-07 ENCOUNTER — Other Ambulatory Visit: Payer: Self-pay | Admitting: Family Medicine

## 2014-06-07 DIAGNOSIS — R42 Dizziness and giddiness: Secondary | ICD-10-CM

## 2014-09-12 ENCOUNTER — Other Ambulatory Visit: Payer: Self-pay | Admitting: *Deleted

## 2014-09-12 MED ORDER — RIVAROXABAN 20 MG PO TABS: 20.0000 mg | ORAL_TABLET | Freq: Every day | ORAL | Status: DC

## 2014-10-25 ENCOUNTER — Ambulatory Visit
Admission: RE | Admit: 2014-10-25 | Discharge: 2014-10-25 | Disposition: A | Discharge status: Home / Self Care | Payer: BC Managed Care – PPO | Source: Ambulatory Visit

## 2014-10-25 ENCOUNTER — Other Ambulatory Visit: Payer: Self-pay

## 2014-10-25 ENCOUNTER — Encounter (INDEPENDENT_AMBULATORY_CARE_PROVIDER_SITE_OTHER): Payer: Self-pay

## 2014-10-25 DIAGNOSIS — Z1231 Encounter for screening mammogram for malignant neoplasm of breast: Secondary | ICD-10-CM

## 2014-10-26 ENCOUNTER — Other Ambulatory Visit: Payer: Self-pay | Admitting: Family

## 2014-10-26 ENCOUNTER — Ambulatory Visit
Admission: RE | Admit: 2014-10-26 | Discharge: 2014-10-26 | Disposition: A | Discharge status: Home / Self Care | Payer: BC Managed Care – PPO | Source: Ambulatory Visit | Attending: Family | Admitting: Family

## 2014-10-26 DIAGNOSIS — R05 Cough: Secondary | ICD-10-CM

## 2014-10-26 DIAGNOSIS — R059 Cough, unspecified: Secondary | ICD-10-CM

## 2014-12-17 ENCOUNTER — Other Ambulatory Visit: Payer: Self-pay | Admitting: *Deleted

## 2014-12-17 MED ORDER — RIVAROXABAN 20 MG PO TABS: 20.0000 mg | ORAL_TABLET | Freq: Every day | ORAL | Status: DC

## 2015-01-01 ENCOUNTER — Encounter: Payer: Self-pay | Admitting: Cardiology

## 2015-01-01 ENCOUNTER — Ambulatory Visit (INDEPENDENT_AMBULATORY_CARE_PROVIDER_SITE_OTHER): Payer: BC Managed Care – PPO | Admitting: Cardiology

## 2015-01-01 VITALS — BP 136/78 | HR 100 | Ht 65.0 in | Wt 234.1 lb

## 2015-01-01 DIAGNOSIS — I481 Persistent atrial fibrillation: Secondary | ICD-10-CM

## 2015-01-01 DIAGNOSIS — I4819 Other persistent atrial fibrillation: Secondary | ICD-10-CM

## 2015-01-01 DIAGNOSIS — R0600 Dyspnea, unspecified: Secondary | ICD-10-CM | POA: Diagnosis not present

## 2015-01-01 DIAGNOSIS — I10 Essential (primary) hypertension: Secondary | ICD-10-CM

## 2015-01-01 DIAGNOSIS — E669 Obesity, unspecified: Secondary | ICD-10-CM

## 2015-01-01 MED ORDER — METOPROLOL SUCCINATE ER 50 MG PO TB24: 50.0000 mg | ORAL_TABLET | Freq: Every day | ORAL | Status: DC

## 2015-01-01 MED ORDER — HYDROCHLOROTHIAZIDE 25 MG PO TABS: 25.0000 mg | ORAL_TABLET | Freq: Every day | ORAL | Status: DC

## 2015-01-01 MED ORDER — RIVAROXABAN 20 MG PO TABS: 20.0000 mg | ORAL_TABLET | Freq: Every day | ORAL | Status: DC

## 2015-01-01 NOTE — Patient Instructions (Signed)
Please increase your Metoprolol to 50 mg a day. A new prescription has been sent into your pharmacy for it and Hydrochlorothiazide 25 mg a day. Continue all other medications as listed.  Your physician has requested that you have an echocardiogram. Echocardiography is a painless test that uses sound waves to create images of your heart. It provides your doctor with information about the size and shape of your heart and how well your heart's chambers and valves are working. This procedure takes approximately one hour. There are no restrictions for this procedure.  Follow up in 2 months with Dr Marlou Porch.  Thank you for choosing [facility]!!

## 2015-01-01 NOTE — Progress Notes (Signed)
[place]. [address], [address] [place], [place]  [address] Phone: [phone] Fax:  [phone]  Date:  01/01/2015   ID:  Carrie Potter, DOB [birth year], MRN [id number]  PCP:  Antony Blackbird, MD   History of Present Illness: Carrie Potter is a 64 y.o. female history of atrial fibrillation. Diagnosed 02/09/12 showing A. fib. Score of 2 CHADS-VASc (F, HTN). She was placed on Xarelto. Had successful cardioversion on 03/08/12 however shortly thereafter reverted back to atrial fibrillation. No change in symptoms. We decided to continue with rate control strategy. We once again had lengthy discussion about anticoagulation and its importance.  Echocardiogram 05/27/10-normal EF, mild to moderate left atrial enlargement LA size 4.9 cm.  During previous visits, she felt truly asymptomatic when it comes to her atrial fibrillation. She has however been feeling some increased shortness of breath which may have been secondary to recent bout of pneumonia as well as deconditioning and weight gain.   Now she states she has had more shortness of breath with activity. She has also gained approximately 15 pounds. She also noted some lower extremity swelling which has improved after stopping diltiazem.. She took Lasix for a few days and was out in the sun and noticed photosensitivity. She has subsequent to stop this. She has also been drinking several glasses of water a day. We discussed fluid restrictions.  Thyroid dose changed.   01/01/15 - [place]. Walks up hill in college. Had to stop a few times there. Going to gym. Treadmill ok. Not much of incline. Had PNA. 5 in office with this. Took out of her.    Wt Readings from Last 3 Encounters:  01/01/15 234 lb 1.9 oz (106.196 kg)  01/24/14 222 lb 12.8 oz (101.061 kg)  07/10/13 210 lb (95.255 kg)     Past Medical History  Diagnosis Date  . HTN (hypertension)   . Asthma   . Hyperlipidemia   . Hypothyroidism   . Obesity   . Palpitation   .  Atrial fibrillation     Past Surgical History  Procedure Laterality Date  . Cardioversion  03/08/2012    Procedure: CARDIOVERSION;  Surgeon: Candee Furbish, MD;  Location: [facility] OR;  Service: Cardiovascular;  Laterality: N/A;  . Tonsillectomy    . Dilation and curettage of uterus      Current Outpatient Prescriptions  Medication Sig Dispense Refill  . acetaminophen (TYLENOL) 500 MG tablet Take 500-1,000 mg by mouth every 6 (six) hours as needed. For pain    . albuterol (PROVENTIL HFA;VENTOLIN HFA) 108 (90 BASE) MCG/ACT inhaler Inhale 2 puffs into the lungs every 4 (four) hours as needed. Shortness of breath    . hydrochlorothiazide (MICROZIDE) 12.5 MG capsule Take 12.5 mg by mouth 2 (two) times daily.     Marland Kitchen levothyroxine (SYNTHROID, LEVOTHROID) 125 MCG tablet Take 125 mcg by mouth daily.    . metoprolol succinate (TOPROL-XL) 25 MG 24 hr tablet Take 1 tablet (25 mg total) by mouth daily. 31 tablet 11  . Pediatric Multivit-Minerals-C (RA GUMMY VITAMINS & MINERALS PO) Take 1 tablet by mouth daily.    . rivaroxaban (XARELTO) 20 MG TABS tablet Take 1 tablet (20 mg total) by mouth daily. 30 tablet 0  . tolterodine (DETROL) 2 MG tablet Take 2 mg by mouth daily.    . valACYclovir (VALTREX) 1000 MG tablet Take 1,000 mg by mouth 2 (two) times daily as needed. Cold sore     No current facility-administered  medications for this visit.    Allergies:    Allergies  Allergen Reactions  . Lisinopril-Hydrochlorothiazide     Throat swelling   . Penicillins Other (See Comments)    whelps    Social History:  The patient  reports that she quit smoking about 30 years ago. Her smoking use included Cigarettes. She smoked 1.00 pack per day. She does not have any smokeless tobacco history on file.   ROS:  Please see the history of present illness.   Denies any strokelike symptoms, no bleeding, no syncope, no orthopnea    PHYSICAL EXAM: VS:  BP 136/78 mmHg  Pulse 100  Ht 5\' 5"  (1.651 m)  Wt 234 lb 1.9 oz  (106.196 kg)  BMI 38.96 kg/m2 Well nourished, well developed, in no acute distress HEENT: normal Neck: no JVD Cardiac: Irregularly irregular, normal rate; no murmur Lungs:  clear to auscultation bilaterally, no wheezing, rhonchi or rales Abd: soft, nontender, no hepatomegalyObese Ext: no edema Skin: warm and dry Neuro: no focal abnormalities noted  EKG:   EKG 01/01/15 shows ventricular rate of 100 bpm, atrial fibrillation. Prior 01/24/14-heart rate 87, atrial fibrillation, nonspecific ST changes. No change when compared to prior     ASSESSMENT AND PLAN:  1. Persistent atrial fibrillation-we will continue with rate control strategy. Cardioversion only successful for a few days. She felt no different after cardioversion. We will continue with rate control. I'm going to increase her metoprolol from 25 up to 50 mg. Her ventricular rate currently is 100 bpm. We had lengthy discussion about anticoagulation. She will need this lifelong. We also discussed the possibility of trying rhythm control once again. She asked about ablative therapy. Given her length of time and persistent atrial fibrillation, left atrial size, weight, success rate will be lessened. However, I'm not opposed to once again trying rhythm control. One possible strategy would be to place her on dofetilide, hospitalization. Cardioversion. See if antiarrhythmic medication would work. If this were to fail, then discussed possibility of ablative therapy especially if she feels symptomatically improved with restoration of sinus rhythm. We discussed the possibility of consultation with Dr. Rayann Heman in that situation. 2. Chronic anticoagulation-encouraged use of anticoagulant. Reviewed scoring mechanism. 3. Obesity-we discussed at length. Weight is back up to and 15 pounds. She is going to have some dependent edema secondary to this. She's also going to have some shortness of breath secondary to this. For the health of her heart, joints, advocated  weight loss. Weight Watchers can be helpful. Carbs 4. Hypertension-previously well-controlled, mildly elevated today. Photosensitivity with Lasix. She is off. 5. 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 LDL was 202 at last check. She is currently taking Crestor 5 mg once a day. I applauded her use of this medication. She is having this monitored by her primary physician. 6. I will see her back in 2 months or sooner if necessary.  Signed, Candee Furbish, MD [facility]  01/01/2015 8:25 AM

## 2015-01-04 ENCOUNTER — Other Ambulatory Visit: Payer: Self-pay

## 2015-01-04 ENCOUNTER — Ambulatory Visit (HOSPITAL_COMMUNITY): Payer: BC Managed Care – PPO | Attending: Cardiology | Admitting: Radiology

## 2015-01-04 DIAGNOSIS — E669 Obesity, unspecified: Secondary | ICD-10-CM | POA: Diagnosis not present

## 2015-01-04 DIAGNOSIS — R0600 Dyspnea, unspecified: Secondary | ICD-10-CM | POA: Insufficient documentation

## 2015-01-04 DIAGNOSIS — I4819 Other persistent atrial fibrillation: Secondary | ICD-10-CM

## 2015-01-04 DIAGNOSIS — I10 Essential (primary) hypertension: Secondary | ICD-10-CM | POA: Insufficient documentation

## 2015-01-04 DIAGNOSIS — Z87891 Personal history of nicotine dependence: Secondary | ICD-10-CM | POA: Diagnosis not present

## 2015-01-04 DIAGNOSIS — E785 Hyperlipidemia, unspecified: Secondary | ICD-10-CM | POA: Diagnosis not present

## 2015-01-04 DIAGNOSIS — I481 Persistent atrial fibrillation: Secondary | ICD-10-CM | POA: Diagnosis not present

## 2015-01-04 MED ORDER — PERFLUTREN LIPID MICROSPHERE
1.0000 mL | INTRAVENOUS | Status: AC | PRN
  Administered 2015-01-04: 4 mL via INTRAVENOUS

## 2015-01-04 NOTE — Progress Notes (Signed)
Echocardiogram performed with [facility].  

## 2015-03-12 ENCOUNTER — Encounter: Payer: Self-pay | Admitting: Cardiology

## 2015-03-12 ENCOUNTER — Ambulatory Visit (INDEPENDENT_AMBULATORY_CARE_PROVIDER_SITE_OTHER): Payer: BC Managed Care – PPO | Admitting: Cardiology

## 2015-03-12 VITALS — BP 140/88 | HR 64 | Ht 64.0 in | Wt 227.5 lb

## 2015-03-12 DIAGNOSIS — I10 Essential (primary) hypertension: Secondary | ICD-10-CM | POA: Diagnosis not present

## 2015-03-12 DIAGNOSIS — Z7901 Long term (current) use of anticoagulants: Secondary | ICD-10-CM

## 2015-03-12 DIAGNOSIS — E785 Hyperlipidemia, unspecified: Secondary | ICD-10-CM | POA: Diagnosis not present

## 2015-03-12 DIAGNOSIS — I4819 Other persistent atrial fibrillation: Secondary | ICD-10-CM

## 2015-03-12 DIAGNOSIS — I481 Persistent atrial fibrillation: Secondary | ICD-10-CM

## 2015-03-12 NOTE — Patient Instructions (Signed)
Medication Instructions:  The current medical regimen is effective;  continue present plan and medications.  You have been referred to the Atrial Fibrillation Clinic.  Follow-Up: Follow up in 6 months with Dr. Marlou Porch.  You will receive a letter in the mail 2 months before you are due.  Please call us when you receive this letter to schedule your follow up appointment.  Thank you for choosing [facility]!!

## 2015-03-12 NOTE — Progress Notes (Signed)
[place]. [address] [place], [place]  [address] Phone: [phone] Fax:  [phone]  Date:  03/12/2015   ID:  Carrie Potter, DOB [birth year], MRN [id number]  PCP:  Antony Blackbird, MD   History of Present Illness: Carrie Potter is a 64 y.o. female history of atrial fibrillation. Diagnosed 02/09/12 showing A. fib. Score of 2 CHADS-VASc (F, HTN). She was placed on Xarelto. Had successful cardioversion on 03/08/12 however shortly thereafter reverted back to atrial fibrillation. No change in symptoms. We decided to continue with rate control strategy. We once again had lengthy discussion about anticoagulation and its importance.  Echocardiogram -normal EF, mild to moderate left atrial enlargement LA size 4.9 cm.  During previous visits, she felt truly asymptomatic when it comes to her atrial fibrillation. She has however been feeling some increased shortness of breath well as deconditioning and weight gain.   Now she states she has had more shortness of breath with activity. She has also gained approximately 15 pounds. She also noted some lower extremity swelling which has improved after stopping diltiazem.. She took Lasix for a few days and was out in the sun and noticed photosensitivity. She has subsequent to stop this.   Thyroid dose changed.    Wt Readings from Last 3 Encounters:  03/12/15 227 lb 8 oz (103.193 kg)  01/01/15 234 lb 1.9 oz (106.196 kg)  01/24/14 222 lb 12.8 oz (101.061 kg)     Past Medical History  Diagnosis Date  . HTN (hypertension)   . Asthma   . Hyperlipidemia   . Hypothyroidism   . Obesity   . Palpitation   . Atrial fibrillation     Past Surgical History  Procedure Laterality Date  . Cardioversion  03/08/2012    Procedure: CARDIOVERSION;  Surgeon: Candee Furbish, MD;  [facility]: [facility];  Service: Cardiovascular;  Laterality: N/A;  . Tonsillectomy    . Dilation and curettage of uterus      Current Outpatient Prescriptions  Medication Sig  Dispense Refill  . acetaminophen (TYLENOL) 500 MG tablet Take 500-1,000 mg by mouth every 6 (six) hours as needed. For pain    . albuterol (PROVENTIL HFA;VENTOLIN HFA) 108 (90 BASE) MCG/ACT inhaler Inhale 2 puffs into the lungs every 4 (four) hours as needed. Shortness of breath    . hydrochlorothiazide (HYDRODIURIL) 25 MG tablet Take 1 tablet (25 mg total) by mouth daily. 30 tablet 11  . levothyroxine (SYNTHROID, LEVOTHROID) 125 MCG tablet Take 125 mcg by mouth daily.    . metoprolol succinate (TOPROL-XL) 50 MG 24 hr tablet Take 1 tablet (50 mg total) by mouth daily. 30 tablet 11  . Pediatric Multivit-Minerals-C (RA GUMMY VITAMINS & MINERALS PO) Take 1 tablet by mouth daily.    . rivaroxaban (XARELTO) 20 MG TABS tablet Take 1 tablet (20 mg total) by mouth daily. 30 tablet 11  . tolterodine (DETROL) 2 MG tablet Take 2 mg by mouth daily.    . valACYclovir (VALTREX) 1000 MG tablet Take 1,000 mg by mouth 2 (two) times daily as needed. Cold sore     No current facility-administered medications for this visit.    Allergies:    Allergies  Allergen Reactions  . Lisinopril-Hydrochlorothiazide     Throat swelling   . Penicillins Other (See Comments)    whelps    Social History:  The patient  reports that she quit smoking about 30 years ago. Her smoking use included Cigarettes. She smoked 1.00  pack per day. She does not have any smokeless tobacco history on file.   ROS:  Please see the history of present illness.   Denies any strokelike symptoms, no bleeding, no syncope, no orthopnea    PHYSICAL EXAM: VS:  BP 140/88 mmHg  Pulse 64  Ht 5\' 4"  (1.626 m)  Wt 227 lb 8 oz (103.193 kg)  BMI 39.03 kg/m2 Well nourished, well developed, in no acute distress HEENT: normal Neck: no JVD Cardiac: Irregularly irregular, normal rate; no murmur Lungs:  clear to auscultation bilaterally, no wheezing, rhonchi or rales Abd: soft, nontender, no hepatomegalyObese Ext: no edema Skin: warm and dry Neuro: no  focal abnormalities noted  EKG:   EKG 01/01/15 shows ventricular rate of 100 bpm, atrial fibrillation. Prior 01/24/14-heart rate 87, atrial fibrillation, nonspecific ST changes. No change when compared to prior     Echocardiogram: 01/04/15-normal ejection fraction, moderately dilated left atrium. ASSESSMENT AND PLAN:  1. Persistent atrial fibrillation-we will continue with rate control strategy. Cardioversion only successful for a few days. She felt no different after cardioversion. She still however has shortness of breath when going up hills. This could be multifactorial, obesity etc.  We had lengthy discussion about anticoagulation. She will need this lifelong. We also discussed the possibility of trying rhythm control once again. She asked about ablative therapy. Given her length of time and persistent atrial fibrillation, left atrial size, weight, success rate will be lessened. However, I'm not opposed to once again trying rhythm control. One possible strategy would be to place her on dofetilide, hospitalization. Cardioversion. See if antiarrhythmic medication would work. If this were to fail, then discussed possibility of ablative therapy especially if she feels symptomatically improved with restoration of sinus rhythm. We discussed the possibility of consultation with Dr. Rayann Heman and she would like to discuss with him.. 2. Chronic anticoagulation-encouraged use of anticoagulant. Reviewed scoring mechanism. 3. Obesity-we discussed at length. Weight is back up. She is going to have some dependent edema secondary to this. She's also going to have some shortness of breath secondary to this. For the health of her heart, joints, advocated weight loss. Weight Watchers can be helpful. Carbs 4. Hypertension-previously well-controlled, mildly elevated today. Photosensitivity with Lasix. She is off. 5. 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 LDL was 202 at last check. She tried Crestor but felt her thighs. Offered her  pravastatin. She will call. 6. I will see her back in 6 months or sooner if necessary. AFIB clinic referral.  Signed, Candee Furbish, MD [facility]  03/12/2015 4:19 PM

## 2015-04-02 ENCOUNTER — Encounter (HOSPITAL_COMMUNITY): Payer: Self-pay | Admitting: Nurse Practitioner

## 2015-04-02 ENCOUNTER — Ambulatory Visit (HOSPITAL_COMMUNITY)
Admission: RE | Admit: 2015-04-02 | Discharge: 2015-04-02 | Disposition: A | Discharge status: Home / Self Care | Payer: BC Managed Care – PPO | Source: Ambulatory Visit | Attending: Nurse Practitioner | Admitting: Nurse Practitioner

## 2015-04-02 VITALS — BP 158/98 | HR 85 | Ht 64.0 in | Wt 229.2 lb

## 2015-04-02 DIAGNOSIS — I4819 Other persistent atrial fibrillation: Secondary | ICD-10-CM

## 2015-04-02 DIAGNOSIS — I482 Chronic atrial fibrillation, unspecified: Secondary | ICD-10-CM

## 2015-04-02 DIAGNOSIS — I481 Persistent atrial fibrillation: Secondary | ICD-10-CM | POA: Insufficient documentation

## 2015-04-02 MED ORDER — DILTIAZEM HCL ER COATED BEADS 120 MG PO CP24: 120.0000 mg | ORAL_CAPSULE | Freq: Every day | ORAL | Status: DC

## 2015-04-02 NOTE — Patient Instructions (Signed)
Your physician has recommended you make the following change in your medication:  1) Start Cardizem CD 120mg  once a day  Parking code for august [id number]

## 2015-04-03 ENCOUNTER — Encounter (HOSPITAL_COMMUNITY): Payer: Self-pay | Admitting: Nurse Practitioner

## 2015-04-03 NOTE — Progress Notes (Signed)
Patient ID: Carrie Potter, female   DOB: Sep 23, 1950, 64 y.o.   MRN: [id number]     Primary Care Physician: Antony Blackbird, MD Referring Physician: Dr. Dorthula Rue ARMANII Potter is a 64 y.o. female with a h/o longstanding persistent afib since 2013. When diagnosed she was asymptomatic and has been rate controlled.Just since the first of the year she has has noticed some exercise intolerance with more exertional dyspnea. She was exercising on a fairly regular basis but developed pneumonia the first of the year and got out of the routine. She has also gained a 15 lbs over a period of time.. She and her husband are here today to discuss symptoms and if restoring SR would help improve quality of life.  She is not a smoker/ minimal alcohol use. Minimal snoring and husband, who is nurse anesthetist,  does not think that she has sleep apnea. Does drink moderate amounts of caffeine. Recent echo revealed enlarged  left atrium at 49 mm.On xarelto for chadsvasc score of at least 2.She had one cardioversion in the past 6/13 however had ERAF. She does have a lot of stress in hr job often working 15 hour days at a [employer]. She was on cardizem in the past and stopped for unclear reasons.  Today, she denies symptoms of palpitations, chest pain, shortness of breath, orthopnea, PND, lower extremity edema, dizziness, presyncope, syncope, or neurologic sequela. The patient is tolerating medications without difficulties and is otherwise without complaint today.   Past Medical History  Diagnosis Date  . HTN (hypertension)   . Asthma   . Hyperlipidemia   . Hypothyroidism   . Obesity   . Palpitation   . Atrial fibrillation    Past Surgical History  Procedure Laterality Date  . Cardioversion  03/08/2012    Procedure: CARDIOVERSION;  Surgeon: Candee Furbish, MD;  Location: [facility] OR;  Service: Cardiovascular;  Laterality: N/A;  . Tonsillectomy    . Dilation and curettage of uterus      Current Outpatient  Prescriptions  Medication Sig Dispense Refill  . acetaminophen (TYLENOL) 500 MG tablet Take 500-1,000 mg by mouth every 6 (six) hours as needed. For pain    . albuterol (PROVENTIL HFA;VENTOLIN HFA) 108 (90 BASE) MCG/ACT inhaler Inhale 2 puffs into the lungs every 4 (four) hours as needed. Shortness of breath    . hydrochlorothiazide (HYDRODIURIL) 25 MG tablet Take 1 tablet (25 mg total) by mouth daily. 30 tablet 11  . levothyroxine (SYNTHROID, LEVOTHROID) 125 MCG tablet Take 125 mcg by mouth daily.    . metoprolol succinate (TOPROL-XL) 50 MG 24 hr tablet Take 1 tablet (50 mg total) by mouth daily. 30 tablet 11  . Pediatric Multivit-Minerals-C (RA GUMMY VITAMINS & MINERALS PO) Take 1 tablet by mouth daily.    . rivaroxaban (XARELTO) 20 MG TABS tablet Take 1 tablet (20 mg total) by mouth daily. 30 tablet 11  . tolterodine (DETROL) 2 MG tablet Take 2 mg by mouth daily.    . valACYclovir (VALTREX) 1000 MG tablet Take 1,000 mg by mouth 2 (two) times daily as needed. Cold sore    . diltiazem (CARDIZEM CD) 120 MG 24 hr capsule Take 1 capsule (120 mg total) by mouth daily. 30 capsule 1   No current facility-administered medications for this encounter.    Allergies  Allergen Reactions  . Lisinopril-Hydrochlorothiazide     Throat swelling   . Penicillins Other (See Comments)    whelps    History  Social History  . Marital Status: Married    Spouse Name: N/A  . Number of Children: N/A  . Years of Education: N/A   Occupational History  . Not on file.   Social History Main Topics  . Smoking status: Former Smoker -- 1.00 packs/day    Types: Cigarettes    Quit date: 05/10/1984  . Smokeless tobacco: Not on file  . Alcohol Use: Not on file  . Drug Use: Not on file  . Sexual Activity: Not on file   Other Topics Concern  . Not on file   Social History Narrative    Family History  Problem Relation Age of Onset  . COPD Father   . Heart disease Father   . Cancer Father     skin  .  Breast cancer Mother   . Cancer Mother     skin  . Hypertension Mother     ROS- All systems are reviewed and negative except as per the HPI above  Physical Exam: Filed Vitals:   04/02/15 1503  BP: 158/98  Pulse: 85  Height: 5\' 4"  (1.626 m)  Weight: 229 lb 3.2 oz (103.964 kg)    GEN- The patient is well appearing, alert and oriented x 3 today.   Head- normocephalic, atraumatic Eyes-  Sclera clear, conjunctiva pink Ears- hearing intact Oropharynx- clear Neck- supple, no JVP Lymph- no cervical lymphadenopathy Lungs- Clear to ausculation bilaterally, normal work of breathing Heart- Irregular rate and rhythm, no murmurs, rubs or gallops, PMI not laterally displaced GI- soft, NT, ND, + BS Extremities- no clubbing, cyanosis, or edema MS- no significant deformity or atrophy Skin- no rash or lesion Psych- euthymic mood, full affect Neuro- strength and sensation are intact  EKG- Afib at 85 bpm, QRS 74 ms, QTc, 454 ms.  Echo-- Left ventricle: The cavity size was normal. There was mild concentric hypertrophy. Systolic function was normal. The estimated ejection fraction was in the range of 55% to 60%. Wall motion was normal; there were no regional wall motion abnormalities. The study was not technically sufficient to allow evaluation of LV diastolic dysfunction due to atrial fibrillation. - Aortic valve: Trileaflet; normal thickness leaflets. There was no regurgitation. - Mitral valve: Structurally normal valve. There was mild regurgitation. - Left atrium: The atrium was moderately dilated. - Right ventricle: Systolic function was normal. - Right atrium: The atrium was normal in size. - Tricuspid valve: There was mild regurgitation. - Pulmonic valve: There was no regurgitation. - Pulmonary arteries: Systolic pressure was within the normal range. - Inferior vena cava: The vessel was normal in size. - Pericardium, extracardiac: A trivial pericardial effusion  was identified posterior to the heart.  Epic records reviewed  Assessment and Plan: 1. Longstanding  persistent atrial fibrillation, recently mildly symptomatic Options discussed re AAD therapy and ablation therapy It may prove difficult to restore SR due to longstanding h/o afib, size of left atrium and lifestyle. We discussed since she has been more symptomatic for the last few months that it may be due to deconditioning and weight gain.  For now, additional rate control may be helpful and she would like to try Cardizem 120 mg a day in addition to metoprolol. Continue xarelto. In the interim, she will try to increase exercise and work on weight loss. Diet class info given. I will discuss with Dr. Rayann Heman and when she returns in 2 weeks will let her know her options/chances to restore SR.

## 2015-04-08 ENCOUNTER — Telehealth (HOSPITAL_COMMUNITY): Payer: Self-pay | Admitting: Nurse Practitioner

## 2015-04-08 NOTE — Telephone Encounter (Signed)
Pt retried cardizem and did break out in a rash. Has stopped and will increase metoprolol to 75 mg a day for better rate control.

## 2015-04-09 ENCOUNTER — Telehealth (HOSPITAL_COMMUNITY): Payer: Self-pay | Admitting: Nurse Practitioner

## 2015-04-09 NOTE — Telephone Encounter (Signed)
I discussed with Dr. Rayann Heman  and let pt know that Dr. Rayann Heman said it would be a challenge to restore SR, but could try tikosyn or amiodarone to get back in rhythm, but he would not do ablation unless she fails both, and he would only suggest antiarrythmic's if she is very symptomatic. Pt will think about options and will discuss on f/u next week.

## 2015-04-17 ENCOUNTER — Ambulatory Visit (HOSPITAL_COMMUNITY)
Admission: RE | Admit: 2015-04-17 | Discharge: 2015-04-17 | Disposition: A | Discharge status: Home / Self Care | Payer: BC Managed Care – PPO | Source: Ambulatory Visit | Attending: Nurse Practitioner | Admitting: Nurse Practitioner

## 2015-04-17 DIAGNOSIS — I482 Chronic atrial fibrillation, unspecified: Secondary | ICD-10-CM

## 2015-04-17 DIAGNOSIS — E669 Obesity, unspecified: Secondary | ICD-10-CM | POA: Insufficient documentation

## 2015-04-18 ENCOUNTER — Encounter (HOSPITAL_COMMUNITY): Payer: Self-pay | Admitting: Nurse Practitioner

## 2015-04-18 LAB — BASIC METABOLIC PANEL
Anion gap: 11 (ref 5–15)
BUN: 17 mg/dL (ref 6–20)
CHLORIDE: 99 mmol/L — AB (ref 101–111)
CO2: 29 mmol/L (ref 22–32)
Calcium: 9.6 mg/dL (ref 8.9–10.3)
Creatinine, Ser: 0.7 mg/dL (ref 0.44–1.00)
GFR calc Af Amer: >60 mL/min (ref >60)
GFR calc non Af Amer: >60 mL/min (ref >60)
GLUCOSE: 69 mg/dL (ref 65–99)
Potassium: 4.3 mmol/L (ref 3.5–5.1)
Sodium: 139 mmol/L (ref 135–145)

## 2015-04-18 LAB — MAGNESIUM: MAGNESIUM: 1.9 mg/dL (ref 1.7–2.4)

## 2015-04-18 LAB — TSH: TSH: 0.857 u[IU]/mL (ref 0.350–4.500)

## 2015-04-18 NOTE — Progress Notes (Signed)
Patient ID: MINETTE MANDERS, female   DOB: [birth year], 64 y.o.   MRN: [id number]     Primary Care Physician: Antony Blackbird, MD Referring Physician: Dr. Erling Conte GIANNAMARIE PAULUS is a 64 y.o. female with a h/olongstanding afib, obesity, HTN, that is here f/u for afib. On last visit, we discussed various ways of going forward in the treatment of afib and for the short term went with increase of rate control and lifestyle measures.  We did re challenge Cardizem with rash in the past and it reoccurred, so d/ced and  increased BB. We also discussed trying to restore SR and I conferred with Dr. Chyrel Masson and he said that although may prove to be a challenge, would suggest Tikosyn or amiodarone but he would not recommend ablation unless she failed both and was very symptomatic.  After increasing BB and lifestyle modification, she does feel slightly better, better rate controlled, but she would like to try to restore sinus rhythm if possible. She would prefer Tikosyn. Her QT is borderline at 463 ms, an EKG  that showed SR after cardioversion years ago was at 446 ms. I talked to Dr. Lovena Le and he thought reasonable QT length to attempt tikosyn.  Today, she denies symptoms of palpitations, chest pain, shortness of breath, orthopnea, PND, lower extremity edema, dizziness, presyncope, syncope, or neurologic sequela. The patient is tolerating medications without difficulties and is otherwise without complaint today.   Past Medical History  Diagnosis Date  . HTN (hypertension)   . Asthma   . Hyperlipidemia   . Hypothyroidism   . Obesity   . Palpitation   . Atrial fibrillation    Past Surgical History  Procedure Laterality Date  . Cardioversion  03/08/2012    Procedure: CARDIOVERSION;  Surgeon: Candee Furbish, MD;  Location: [facility] OR;  Service: Cardiovascular;  Laterality: N/A;  . Tonsillectomy    . Dilation and curettage of uterus      Current Outpatient Prescriptions  Medication Sig Dispense Refill    . acetaminophen (TYLENOL) 500 MG tablet Take 500-1,000 mg by mouth every 6 (six) hours as needed. For pain    . albuterol (PROVENTIL HFA;VENTOLIN HFA) 108 (90 BASE) MCG/ACT inhaler Inhale 2 puffs into the lungs every 4 (four) hours as needed. Shortness of breath    . hydrochlorothiazide (HYDRODIURIL) 25 MG tablet Take 1 tablet (25 mg total) by mouth daily. 30 tablet 11  . levothyroxine (SYNTHROID, LEVOTHROID) 125 MCG tablet Take 125 mcg by mouth daily.    . metoprolol succinate (TOPROL-XL) 50 MG 24 hr tablet Take 1 tablet (50 mg total) by mouth daily. 30 tablet 11  . Pediatric Multivit-Minerals-C (RA GUMMY VITAMINS & MINERALS PO) Take 1 tablet by mouth daily.    . rivaroxaban (XARELTO) 20 MG TABS tablet Take 1 tablet (20 mg total) by mouth daily. 30 tablet 11  . tolterodine (DETROL) 2 MG tablet Take 2 mg by mouth daily.    . valACYclovir (VALTREX) 1000 MG tablet Take 1,000 mg by mouth 2 (two) times daily as needed. Cold sore    . diltiazem (CARDIZEM CD) 120 MG 24 hr capsule Take 1 capsule (120 mg total) by mouth daily. (Patient not taking: Reported on 04/17/2015) 30 capsule 1   No current facility-administered medications for this encounter.    Allergies  Allergen Reactions  . Lisinopril-Hydrochlorothiazide     Throat swelling   . Penicillins Other (See Comments)    whelps    History   Social History  .  Marital Status: Married    Spouse Name: N/A  . Number of Children: N/A  . Years of Education: N/A   Occupational History  . Not on file.   Social History Main Topics  . Smoking status: Former Smoker -- 1.00 packs/day    Types: Cigarettes    Quit date: 05/10/1984  . Smokeless tobacco: Not on file  . Alcohol Use: Not on file  . Drug Use: Not on file  . Sexual Activity: Not on file   Other Topics Concern  . Not on file   Social History Narrative    Family History  Problem Relation Age of Onset  . COPD Father   . Heart disease Father   . Cancer Father     skin  .  Breast cancer Mother   . Cancer Mother     skin  . Hypertension Mother     ROS- All systems are reviewed and negative except as per the HPI above  Physical Exam: Filed Vitals:   04/17/15 1552  BP: 120/82  Pulse: 79  Height: 5\' 4"  (1.626 m)  Weight: 232 lb 3.2 oz (105.325 kg)    GEN- The patient is well appearing, alert and oriented x 3 today.   Head- normocephalic, atraumatic Eyes-  Sclera clear, conjunctiva pink Ears- hearing intact Oropharynx- clear Neck- supple, no JVP Lymph- no cervical lymphadenopathy Lungs- Clear to ausculation bilaterally, normal work of breathing Heart- Irregular rate and rhythm, no murmurs, rubs or gallops, PMI not laterally displaced GI- soft, NT, ND, + BS Extremities- no clubbing, cyanosis, or edema MS- no significant deformity or atrophy Skin- no rash or lesion Psych- euthymic mood, full affect Neuro- strength and sensation are intact  EKG- Afib at 79 bpm, QRS int 74 ms, QTc 463. Prior EKG in SR with QTc at 446 ms. Echo- 4/16 Left ventricle: The cavity size was normal. There was mild concentric hypertrophy. Systolic function was normal. The estimated ejection fraction was in the range of 55% to 60%. Wall motion was normal; there were no regional wall motion abnormalities. The study was not technically sufficient to allow evaluation of LV diastolic dysfunction due to atrial fibrillation. - Aortic valve: Trileaflet; normal thickness leaflets. There was no regurgitation. - Mitral valve: Structurally normal valve. There was mild regurgitation. - Left atrium: The atrium was moderately dilated. - Right ventricle: Systolic function was normal. - Right atrium: The atrium was normal in size. - Tricuspid valve: There was mild regurgitation. - Pulmonic valve: There was no regurgitation. - Pulmonary arteries: Systolic pressure was within the normal range. - Inferior vena cava: The vessel was normal in size. - Pericardium,  extracardiac: A trivial pericardial effusion was identified posterior to the heart. Left atrium 49 mm   Assessment and Plan: 1. Longstanding symptomatic afib Pt has become more dyspneic with exertion and is weepy thinking she may be this way the rest of her life. She is looking forward to retirement and being active.  Will staff message Elberta Leatherwood to see if we can arrange admission for tikosyn load with pt understanding it will be a challenge due to 3 years of chronic afib and left atrial size and borderline QTc. Bmet and Mag today  2. Obesity Encouraged to lose weight to help with restoring SR  Butch Penny C. Denvil Canning, [facility] [address] [place], [place] [address] [phone]

## 2015-04-29 ENCOUNTER — Telehealth: Payer: Self-pay | Admitting: *Deleted

## 2015-04-29 ENCOUNTER — Other Ambulatory Visit (HOSPITAL_COMMUNITY): Payer: Self-pay | Admitting: *Deleted

## 2015-04-29 MED ORDER — METOPROLOL SUCCINATE ER 50 MG PO TB24: ORAL_TABLET | ORAL | Status: DC

## 2015-04-29 NOTE — Telephone Encounter (Signed)
rx sent in for correct dose of metoprolol which is 75mg  once a day.

## 2015-04-29 NOTE — Telephone Encounter (Signed)
Patient is requesting an rx for metoprolol succinate 50mg  tablet, take one and one half qd. Please advise. Thanks, MI

## 2015-04-30 ENCOUNTER — Ambulatory Visit (INDEPENDENT_AMBULATORY_CARE_PROVIDER_SITE_OTHER): Payer: BC Managed Care – PPO | Admitting: Pharmacist

## 2015-04-30 ENCOUNTER — Inpatient Hospital Stay (HOSPITAL_COMMUNITY)
Admission: AD | Admit: 2015-04-30 | Discharge: 2015-05-03 | DRG: 309 | Disposition: A | Discharge status: Home / Self Care | Payer: BC Managed Care – PPO | Source: Ambulatory Visit | Attending: Internal Medicine | Admitting: Internal Medicine

## 2015-04-30 DIAGNOSIS — E669 Obesity, unspecified: Secondary | ICD-10-CM | POA: Diagnosis present

## 2015-04-30 DIAGNOSIS — Z87891 Personal history of nicotine dependence: Secondary | ICD-10-CM

## 2015-04-30 DIAGNOSIS — I482 Chronic atrial fibrillation, unspecified: Secondary | ICD-10-CM

## 2015-04-30 DIAGNOSIS — I10 Essential (primary) hypertension: Secondary | ICD-10-CM | POA: Diagnosis present

## 2015-04-30 DIAGNOSIS — Z880 Allergy status to penicillin: Secondary | ICD-10-CM

## 2015-04-30 DIAGNOSIS — E039 Hypothyroidism, unspecified: Secondary | ICD-10-CM | POA: Diagnosis present

## 2015-04-30 DIAGNOSIS — I4891 Unspecified atrial fibrillation: Secondary | ICD-10-CM | POA: Diagnosis present

## 2015-04-30 DIAGNOSIS — J45909 Unspecified asthma, uncomplicated: Secondary | ICD-10-CM | POA: Diagnosis present

## 2015-04-30 DIAGNOSIS — I481 Persistent atrial fibrillation: Secondary | ICD-10-CM | POA: Diagnosis present

## 2015-04-30 DIAGNOSIS — E785 Hyperlipidemia, unspecified: Secondary | ICD-10-CM | POA: Diagnosis present

## 2015-04-30 DIAGNOSIS — Z6841 Body Mass Index (BMI) 40.0 and over, adult: Secondary | ICD-10-CM

## 2015-04-30 DIAGNOSIS — K219 Gastro-esophageal reflux disease without esophagitis: Secondary | ICD-10-CM | POA: Diagnosis present

## 2015-04-30 HISTORY — DX: Pneumonia, unspecified organism: J18.9

## 2015-04-30 LAB — MAGNESIUM: Magnesium: 1.9 mg/dL (ref 1.5–2.5)

## 2015-04-30 LAB — BASIC METABOLIC PANEL
BUN: 24 mg/dL — ABNORMAL HIGH (ref 6–23)
CALCIUM: 9.4 mg/dL (ref 8.4–10.5)
CO2: 32 mEq/L (ref 19–32)
Chloride: 102 mEq/L (ref 96–112)
Creatinine, Ser: 1.05 mg/dL (ref 0.40–1.20)
GFR: 56.25 mL/min — ABNORMAL LOW (ref >60.00)
GLUCOSE: 127 mg/dL — AB (ref 70–99)
Potassium: 4.1 mEq/L (ref 3.5–5.1)
SODIUM: 140 meq/L (ref 135–145)

## 2015-04-30 MED ORDER — RIVAROXABAN 20 MG PO TABS
20.0000 mg | ORAL_TABLET | Freq: Every day | ORAL | Status: DC
  Administered 2015-04-30 – 2015-05-02 (×3): 20 mg via ORAL
  Filled 2015-04-30 (×4): qty 1

## 2015-04-30 MED ORDER — SODIUM CHLORIDE 0.9 % IJ SOLN: 3.0000 mL | INTRAMUSCULAR | Status: DC | PRN

## 2015-04-30 MED ORDER — DOFETILIDE 500 MCG PO CAPS
500.0000 ug | ORAL_CAPSULE | Freq: Two times a day (BID) | ORAL | Status: DC
  Administered 2015-04-30 – 2015-05-03 (×5): 500 ug via ORAL
  Filled 2015-04-30 (×8): qty 1

## 2015-04-30 MED ORDER — METOPROLOL SUCCINATE ER 25 MG PO TB24
25.0000 mg | ORAL_TABLET | Freq: Every evening | ORAL | Status: DC
  Administered 2015-04-30 – 2015-05-01 (×2): 25 mg via ORAL
  Filled 2015-04-30 (×3): qty 1

## 2015-04-30 MED ORDER — METOPROLOL SUCCINATE ER 50 MG PO TB24
50.0000 mg | ORAL_TABLET | Freq: Every morning | ORAL | Status: DC
  Administered 2015-05-01 – 2015-05-02 (×2): 50 mg via ORAL
  Filled 2015-04-30 (×2): qty 1

## 2015-04-30 MED ORDER — SODIUM CHLORIDE 0.9 % IJ SOLN
3.0000 mL | Freq: Two times a day (BID) | INTRAMUSCULAR | Status: DC
  Administered 2015-04-30 – 2015-05-03 (×5): 3 mL via INTRAVENOUS

## 2015-04-30 MED ORDER — OXYBUTYNIN CHLORIDE ER 5 MG PO TB24
5.0000 mg | ORAL_TABLET | Freq: Every day | ORAL | Status: DC
  Administered 2015-05-01 – 2015-05-02 (×2): 5 mg via ORAL
  Filled 2015-04-30 (×3): qty 1

## 2015-04-30 MED ORDER — SODIUM CHLORIDE 0.9 % IV SOLN: 250.0000 mL | INTRAVENOUS | Status: DC | PRN

## 2015-04-30 MED ORDER — METOPROLOL SUCCINATE ER 50 MG PO TB24: 75.0000 mg | ORAL_TABLET | Freq: Every day | ORAL | Status: DC

## 2015-04-30 MED ORDER — LEVOTHYROXINE SODIUM 125 MCG PO TABS
125.0000 ug | ORAL_TABLET | Freq: Every day | ORAL | Status: DC
  Administered 2015-05-01 – 2015-05-03 (×3): 125 ug via ORAL
  Filled 2015-04-30 (×4): qty 1

## 2015-04-30 NOTE — H&P (Signed)
Primary Care Physician: Antony Blackbird, MD Referring Physician: Dr. Erling Conte Carrie Potter is a 64 y.o. female with a h/o longstanding persistent afib, obesity, HTN, now admitted for initiation of AAD therapy.  She has been seen in the AF clinic. We did re challenge Cardizem with rash in the past and it reoccurred, so d/ced and increased BB.  After increasing BB and lifestyle modification, she does feel slightly better, better rate controlled, but she would like to try to restore sinus rhythm if possible. She would prefer Tikosyn.   Today, she denies symptoms of palpitations, chest pain, shortness of breath, orthopnea, PND, lower extremity edema, dizziness, presyncope, syncope, or neurologic sequela. The patient is tolerating medications without difficulties and is otherwise without complaint today.   Past Medical History  Diagnosis Date  . HTN (hypertension)   . Asthma   . Hyperlipidemia   . Hypothyroidism   . Obesity   . Palpitation   . Atrial fibrillation    Past Surgical History  Procedure Laterality Date  . Cardioversion  03/08/2012    Procedure: CARDIOVERSION; Surgeon: Candee Furbish, MD; Location: [facility] OR; Service: Cardiovascular; Laterality: N/A;  . Tonsillectomy    . Dilation and curettage of uterus      Current Outpatient Prescriptions  Medication Sig Dispense Refill  . acetaminophen (TYLENOL) 500 MG tablet Take 500-1,000 mg by mouth every 6 (six) hours as needed. For pain    . albuterol (PROVENTIL HFA;VENTOLIN HFA) 108 (90 BASE) MCG/ACT inhaler Inhale 2 puffs into the lungs every 4 (four) hours as needed. Shortness of breath    . hydrochlorothiazide (HYDRODIURIL) 25 MG tablet Take 1 tablet (25 mg total) by mouth daily. 30 tablet 11  . levothyroxine (SYNTHROID, LEVOTHROID) 125 MCG tablet Take 125 mcg by mouth daily.    . metoprolol succinate (TOPROL-XL) 50 MG 24 hr tablet Take 1 tablet (50 mg  total) by mouth daily. 30 tablet 11  . Pediatric Multivit-Minerals-C (RA GUMMY VITAMINS & MINERALS PO) Take 1 tablet by mouth daily.    . rivaroxaban (XARELTO) 20 MG TABS tablet Take 1 tablet (20 mg total) by mouth daily. 30 tablet 11  . tolterodine (DETROL) 2 MG tablet Take 2 mg by mouth daily.    . valACYclovir (VALTREX) 1000 MG tablet Take 1,000 mg by mouth 2 (two) times daily as needed. Cold sore    . diltiazem (CARDIZEM CD) 120 MG 24 hr capsule Take 1 capsule (120 mg total) by mouth daily. (Patient not taking: Reported on 04/17/2015) 30 capsule 1   No current facility-administered medications for this encounter.    Allergies  Allergen Reactions  . Lisinopril-Hydrochlorothiazide     Throat swelling   . Penicillins Other (See Comments)    whelps    History   Social History  . Marital Status: Married    Spouse Name: N/A  . Number of Children: N/A  . Years of Education: N/A   Occupational History  . Not on file.   Social History Main Topics  . Smoking status: Former Smoker -- 1.00 packs/day    Types: Cigarettes    Quit date: 05/10/1984  . Smokeless tobacco: Not on file  . Alcohol Use: Not on file  . Drug Use: Not on file  . Sexual Activity: Not on file   Other Topics Concern  . Not on file   Social History Narrative    Family History  Problem Relation Age of Onset  . COPD Father   .  Heart disease Father   . Cancer Father     skin  . Breast cancer Mother   . Cancer Mother     skin  . Hypertension Mother     ROS- All systems are reviewed and negative except as per the HPI above  Physical Exam:                   Filed Vitals:   04/30/15 1500  BP: 152/77  Pulse: 81  Temp: 98.3 F (36.8 C)  Resp: 16  GEN- The patient is well appearing, alert and oriented x 3 today.  Head- normocephalic, atraumatic Eyes-  Sclera clear, conjunctiva pink Ears- hearing intact Oropharynx- clear Neck- supple, no JVP Lymph- no cervical lymphadenopathy Lungs- Clear to ausculation bilaterally, normal work of breathing Heart- Irregular rate and rhythm, no murmurs, rubs or gallops, PMI not laterally displaced GI- soft, NT, ND, + BS Extremities- no clubbing, cyanosis, or edema MS- no significant deformity or atrophy Skin- no rash or lesion Psych- euthymic mood, full affect Neuro- strength and sensation are intact   Echo- 4/16 Left ventricle: The cavity size was normal. There was mild concentric hypertrophy. Systolic function was normal. The estimated ejection fraction was in the range of 55% to 60%. Wall motion was normal; there were no regional wall motion abnormalities. The study was not technically sufficient to allow evaluation of LV diastolic dysfunction due to atrial fibrillation. - Aortic valve: Trileaflet; normal thickness leaflets. There was no regurgitation. - Mitral valve: Structurally normal valve. There was mild regurgitation. - Left atrium: The atrium was moderately dilated. - Right ventricle: Systolic function was normal. - Right atrium: The atrium was normal in size. - Tricuspid valve: There was mild regurgitation. - Pulmonic valve: There was no regurgitation. - Pulmonary arteries: Systolic pressure was within the normal range. - Inferior vena cava: The vessel was normal in size. - Pericardium, extracardiac: A trivial pericardial effusion was identified posterior to the heart. Left atrium 49 mm   Assessment and Plan: 1. Longstanding symptomatic afib The patient has symptomatic longstanding persistent afib. Therapeutic strategies for afib including medicine and ablation were discussed in detail with the patient today. Risk, benefits, and alternatives to tikosyn and amiodarone for afib were also discussed in detail today.  She is clear that she would prefer tikosyn.  Qtc  on telemetry is current 450msec.  There is variability in QT in afib.  I would therefore advise that we proceed with initiation of tikosyn and then further assess QT when in sinus.  Chadsvasc score is at least 2. She reports compliance with xarelto.  2. Obesity Encouraged to lose weight to help with restoring SR  3. HTN Stable No change required today  If she remains in afib on Thursday, she will require cardioversion at that time.  Thompson Grayer MD, [facility] 04/30/2015 5:36 PM

## 2015-04-30 NOTE — Progress Notes (Signed)
Patient ID: Carrie Potter           DOB: 10/03/1950, 64 yo                  MRN: [id number]    Primary Care Physician: Antony Blackbird, MD Referring Physician: Dr. Marlou Porch  HPI: Carrie Potter is a 64 y.o. female patient with a h/o longstanding afib, obesity, and HTN. Patient was recently seen in the afib clinic - she was rechallenged with diltiazem with rash in the past and it reoccurred, so diltiazem was d/ced and metoprolol dose was increased. Discussed trying to restore SR and Dr. Rayann Heman suggested Tikosyn or amiodarone but he would not recommend ablation unless she failed both and was very symptomatic. After increasing BB and lifestyle modification, she felt slightly better, better rate controlled, but wanted to try to restore sinus rhythm if possible. Her QTc was borderline at 463, and EKG that showed SR after cardioversion years ago was 446. This was discussed with Dr. Lovena Le who thought QTc was reasonable length to attempt Tikosyn. Patient presents today for hopeful Tikosyn initiation.   Reviewed potential side effects with pt, including QTc prolongation with patient.She is aware of the importance of not missing any doses and calling the office if she misses more than 2 doses in a row. She was taking HCTZ but her last dose was 2 days ago on 04/28/15. Patient is aware that if Tikosyn initiation is successful, she will need to stay off the HCTZ.She is anticoagulated with Xarelto and has not missed any of her doses in the last month.   EKG reviewed by Dr. Caryl Comes. Atrial fibrillation with vent rate of 84 bpm. QTc 484 msec.Results discussed with Dr. Rayann Heman who approved patient for admission to hospital.  Past Medical History  Diagnosis Date  . HTN (hypertension)   . Asthma   . Hyperlipidemia   . Hypothyroidism   . Obesity   . Palpitation   . Atrial fibrillation     Current Outpatient Prescriptions on File Prior to Visit  Medication Sig Dispense Refill  . acetaminophen (TYLENOL) 500 MG  tablet Take 500-1,000 mg by mouth every 6 (six) hours as needed. For pain    . albuterol (PROVENTIL HFA;VENTOLIN HFA) 108 (90 BASE) MCG/ACT inhaler Inhale 2 puffs into the lungs every 4 (four) hours as needed. Shortness of breath    . levothyroxine (SYNTHROID, LEVOTHROID) 125 MCG tablet Take 125 mcg by mouth daily.    . metoprolol succinate (TOPROL-XL) 50 MG 24 hr tablet Take 75mg  (1.5 tablets) by mouth once a day. 45 tablet 6  . Pediatric Multivit-Minerals-C (RA GUMMY VITAMINS & MINERALS PO) Take 1 tablet by mouth daily.    . rivaroxaban (XARELTO) 20 MG TABS tablet Take 1 tablet (20 mg total) by mouth daily. 30 tablet 11  . tolterodine (DETROL) 2 MG tablet Take 2 mg by mouth daily.    . valACYclovir (VALTREX) 1000 MG tablet Take 1,000 mg by mouth 2 (two) times daily as needed. Cold sore    . hydrochlorothiazide (HYDRODIURIL) 25 MG tablet Take 1 tablet (25 mg total) by mouth daily. (Patient not taking: Reported on 04/30/2015) 30 tablet 11   No current facility-administered medications on file prior to visit.    Allergies  Allergen Reactions  . Lisinopril-Hydrochlorothiazide     Throat swelling   . Penicillins Other (See Comments)    whelps  . Diltiazem Rash    Rash on arms and face even when rechallenged  Assessment/Plan: 1. Atrial fibrillation - Patient's labs reviewed. Electrolytes ok at K 4.1, Mg 1.9. SCr 1.05 with CrCl 106mL/min, anticipate starting dose of Tikosyn 514mcg BID. Patient aware to report to admitting.   Carrie Potter E. Eddith Mentor, PharmD [facility] [address] [place], [place] [address] Phone: [phone]; Fax: [phone] 04/30/2015 2:55 PM

## 2015-05-01 ENCOUNTER — Encounter (HOSPITAL_COMMUNITY): Payer: Self-pay | Admitting: General Practice

## 2015-05-01 LAB — BASIC METABOLIC PANEL
Anion gap: 7 (ref 5–15)
BUN: 16 mg/dL (ref 6–20)
CHLORIDE: 104 mmol/L (ref 101–111)
CO2: 26 mmol/L (ref 22–32)
CREATININE: 0.81 mg/dL (ref 0.44–1.00)
Calcium: 8.6 mg/dL — ABNORMAL LOW (ref 8.9–10.3)
GFR calc Af Amer: >60 mL/min (ref >60)
GFR calc non Af Amer: >60 mL/min (ref >60)
GLUCOSE: 101 mg/dL — AB (ref 65–99)
POTASSIUM: 3.9 mmol/L (ref 3.5–5.1)
SODIUM: 137 mmol/L (ref 135–145)

## 2015-05-01 LAB — MAGNESIUM: Magnesium: 2 mg/dL (ref 1.7–2.4)

## 2015-05-01 NOTE — Discharge Instructions (Addendum)

## 2015-05-01 NOTE — Progress Notes (Signed)
Utilization review completed.  

## 2015-05-01 NOTE — Progress Notes (Signed)
   SUBJECTIVE: The patient is doing well today.  At this time, she denies chest pain, shortness of breath, or any new concerns.  CURRENT MEDICATIONS: . dofetilide  500 mcg Oral BID  . levothyroxine  125 mcg Oral QAC breakfast  . metoprolol succinate  50 mg Oral q morning - 10a   And  . metoprolol succinate  25 mg Oral QPM  . oxybutynin  5 mg Oral QHS  . rivaroxaban  20 mg Oral Q supper  . sodium chloride  3 mL Intravenous Q12H      OBJECTIVE: Physical Exam: Filed Vitals:   04/30/15 1500 04/30/15 2032 05/01/15 0449  BP: 152/77 136/73 141/75  Pulse: 81 65 71  Temp: 98.3 F (36.8 C) 97.6 F (36.4 C) 97.8 F (36.6 C)  TempSrc: Oral Oral Oral  Resp: 16 18 19   Height: 5\' 4"  (1.626 m)    Weight: 107.82 kg (237 lb 11.2 oz)    SpO2: 97% 99% 98%    Intake/Output Summary (Last 24 hours) at 05/01/15 1019 Last data filed at 05/01/15 0900  Gross per 24 hour  Intake    360 ml  Output      0 ml  Net    360 ml    Telemetry reveals rate controlled atrial fibrillation  GEN- The patient is well appearing, alert and oriented x 3 today.   Head- normocephalic, atraumatic Eyes-  Sclera clear, conjunctiva pink Ears- hearing intact Oropharynx- clear Neck- supple, no JVP Lymph- no cervical lymphadenopathy Lungs- Clear to ausculation bilaterally, normal work of breathing Heart- Irregular rate and rhythm  GI- soft, NT, ND, + BS Extremities- no clubbing, cyanosis, or edema Skin- no rash or lesion Psych- euthymic mood, full affect Neuro- strength and sensation are intact  LABS: Basic Metabolic Panel:  Recent Labs  04/30/15 1019 05/01/15 0439  NA 140 137  K 4.1 3.9  CL 102 104  CO2 32 26  GLUCOSE 127* 101*  BUN 24* 16  CREATININE 1.05 0.81  CALCIUM 9.4 8.6*  MG 1.9 2.0   ASSESSMENT AND PLAN:  Active Problems:   Atrial fibrillation  1.  Persistent atrial fibrillation Admitted 04/30/15 for Tikosyn load Continue Xarelto for CHADS2VASC of at least 2 QTc stable this  morning Keep K >3.9, Mg> 1.8 If remains in AF tomorrow, will need DCCV - I will arrange  2.  HTN Stable No change required today  3.  Obesity Weight loss encouraged    Thompson Grayer, MD 05/01/2015 10:19 AM

## 2015-05-02 ENCOUNTER — Inpatient Hospital Stay (HOSPITAL_COMMUNITY): Payer: BC Managed Care – PPO | Admitting: Certified Registered Nurse Anesthetist

## 2015-05-02 ENCOUNTER — Encounter (HOSPITAL_COMMUNITY): Payer: Self-pay | Admitting: Certified Registered Nurse Anesthetist

## 2015-05-02 ENCOUNTER — Encounter (HOSPITAL_COMMUNITY)
Admission: AD | Disposition: A | Discharge status: Home / Self Care | Payer: Self-pay | Source: Ambulatory Visit | Attending: Internal Medicine

## 2015-05-02 DIAGNOSIS — I4891 Unspecified atrial fibrillation: Secondary | ICD-10-CM

## 2015-05-02 HISTORY — PX: CARDIOVERSION: SHX1299

## 2015-05-02 LAB — MAGNESIUM: MAGNESIUM: 2.2 mg/dL (ref 1.7–2.4)

## 2015-05-02 LAB — BASIC METABOLIC PANEL
ANION GAP: 6 (ref 5–15)
BUN: 15 mg/dL (ref 6–20)
CHLORIDE: 106 mmol/L (ref 101–111)
CO2: 29 mmol/L (ref 22–32)
Calcium: 9.3 mg/dL (ref 8.9–10.3)
Creatinine, Ser: 0.76 mg/dL (ref 0.44–1.00)
GFR calc Af Amer: >60 mL/min (ref >60)
GLUCOSE: 99 mg/dL (ref 65–99)
POTASSIUM: 4.6 mmol/L (ref 3.5–5.1)
Sodium: 141 mmol/L (ref 135–145)

## 2015-05-02 SURGERY — CARDIOVERSION
Anesthesia: General

## 2015-05-02 MED ORDER — ACETAMINOPHEN 325 MG PO TABS
650.0000 mg | ORAL_TABLET | Freq: Once | ORAL | Status: AC
  Administered 2015-05-02: 650 mg via ORAL
  Filled 2015-05-02: qty 2

## 2015-05-02 MED ORDER — PROPOFOL 10 MG/ML IV BOLUS
INTRAVENOUS | Status: DC | PRN
  Administered 2015-05-02: 70 mg via INTRAVENOUS

## 2015-05-02 MED ORDER — ACETAMINOPHEN 325 MG PO TABS: 650.0000 mg | ORAL_TABLET | Freq: Four times a day (QID) | ORAL | Status: DC | PRN

## 2015-05-02 MED ORDER — LIDOCAINE HCL (CARDIAC) 20 MG/ML IV SOLN
INTRAVENOUS | Status: DC | PRN
  Administered 2015-05-02: 60 mg via INTRAVENOUS

## 2015-05-02 MED ORDER — SODIUM CHLORIDE 0.9 % IV SOLN
INTRAVENOUS | Status: DC
Start: 2015-05-02 — End: 2015-05-03
  Administered 2015-05-02: 50 mL/h via INTRAVENOUS
  Administered 2015-05-02: 14:00:00 via INTRAVENOUS

## 2015-05-02 MED ORDER — PROMETHAZINE HCL 12.5 MG PO TABS
12.5000 mg | ORAL_TABLET | Freq: Once | ORAL | Status: DC
  Filled 2015-05-02: qty 1

## 2015-05-02 NOTE — Interval H&P Note (Signed)
History and Physical Interval Note:  05/02/2015 1:47 PM  Carrie Potter  has presented today for surgery, with the diagnosis of afib  The various methods of treatment have been discussed with the patient and family. After consideration of risks, benefits and other options for treatment, the patient has consented to  Procedure(s): CARDIOVERSION (N/A) as a surgical intervention .  The patient's history has been reviewed, patient examined, no change in status, stable for surgery.  I have reviewed the patient's chart and labs.  Questions were answered to the patient's satisfaction.     SKAINS, MARK

## 2015-05-02 NOTE — Anesthesia Procedure Notes (Signed)
Date/Time: 05/02/2015 2:07 PM Performed by: Lowella Dell Pre-anesthesia Checklist: Patient identified, Emergency Drugs available, Suction available, Patient being monitored and Timeout performed Patient Re-evaluated:Patient Re-evaluated prior to inductionOxygen Delivery Method: Ambu bag Preoxygenation: Pre-oxygenation with 100% oxygen Intubation Type: IV induction Ventilation: Mask ventilation without difficulty Dental Injury: Teeth and Oropharynx as per pre-operative assessment

## 2015-05-02 NOTE — Care Management Note (Signed)
Case Management Note Marvetta Gibbons RN, BSN Unit 2W-Case Manager [phone]  Patient Details  Name: KINGA CASSAR MRN: [id number] Date of Birth: May 28, 1951  Subjective/Objective:     Pt admitted with afib and for Tikosyn load               Action/Plan: PTA pt lived at home with spouse- plan to return home when medically stable-   Expected Discharge Date:      05/03/15            Expected Discharge Plan:  Home/Self Care  In-House Referral:     Discharge planning Services  CM Consult, Medication Assistance  Post Acute Care Choice:    Choice offered to:     DME Arranged:    DME Agency:     HH Arranged:    [place] Agency:     Status of Service:  Completed, signed off  Medicare Important Message Given:    Date Medicare IM Given:    Medicare IM give by:    Date Additional Medicare IM Given:    Additional Medicare Important Message give by:     If discussed at [place] of Stay Meetings, dates discussed:    Additional Comments:  05/02/15- referral for Tikosyn needs - benefits check completed- Brand copay will be $100- generic copay will be $40 -mail order 90 days $120- prior auth not required -- spoke with pt at bedside regarding Tikosyn needs- per pt she uses [facility] on [address]- call made to pharmacy and they are NOT enrolled in [place] program and can not order drug to be in stock- [facility]- [facility] the one at [address] (corner of [address]) call made to this [facility] who is enrolled with Tikosyn and will be able to order drug once script received- informed pt of this and she is agreeable to using this [facility] for Tikosyn needs- pt will need 7 day supply on discharge from [place] (will need 2 scripts written-one with no refills to send to [place] to fill the other with refills for her to take to the [facility] to fill).  Pt understands that she will discharge with 7 day supply.    Dawayne Patricia, RN 05/02/2015, 11:46 AM

## 2015-05-02 NOTE — Progress Notes (Signed)
   SUBJECTIVE: The patient is doing well today.  At this time, she denies chest pain, shortness of breath, or any new concerns.  She says that her Phyllis Ginger was not given last night, however there is no documentation that it was held.  CURRENT MEDICATIONS: . dofetilide  500 mcg Oral BID  . levothyroxine  125 mcg Oral QAC breakfast  . metoprolol succinate  50 mg Oral q morning - 10a   And  . metoprolol succinate  25 mg Oral QPM  . oxybutynin  5 mg Oral QHS  . rivaroxaban  20 mg Oral Q supper  . sodium chloride  3 mL Intravenous Q12H      OBJECTIVE: Physical Exam: Filed Vitals:   05/01/15 0449 05/01/15 1500 05/01/15 2034 05/02/15 0617  BP: 141/75 156/95 161/89 121/55  Pulse: 71 80 81 63  Temp: 97.8 F (36.6 C) 98.2 F (36.8 C) 98.1 F (36.7 C) 97.6 F (36.4 C)  TempSrc: Oral Oral Oral Oral  Resp: 19 18 18 18   Height:      Weight:      SpO2: 98% 100% 98% 95%    Intake/Output Summary (Last 24 hours) at 05/02/15 4825 Last data filed at 05/01/15 2003  Gross per 24 hour  Intake    840 ml  Output      0 ml  Net    840 ml    Telemetry reveals atrial fibrillation with controlled ventricular response  GEN- The patient is well appearing, alert and oriented x 3 today.   Head- normocephalic, atraumatic Eyes-  Sclera clear, conjunctiva pink Ears- hearing intact Oropharynx- clear Neck- supple,   Lungs- Clear to ausculation bilaterally, normal work of breathing Heart- Irregular rate and rhythm  GI- soft, NT, ND, + BS Extremities- no clubbing, cyanosis, or edema Skin- no rash or lesion Psych- euthymic mood, full affect Neuro- strength and sensation are intact  LABS: Basic Metabolic Panel:  Recent Labs  05/01/15 0439 05/02/15 0356  NA 137 141  K 3.9 4.6  CL 104 106  CO2 26 29  GLUCOSE 101* 99  BUN 16 15  CREATININE 0.81 0.76  CALCIUM 8.6* 9.3  MG 2.0 2.2   EKG- afib, Qtc 442  ASSESSMENT AND PLAN:  Active Problems:   Atrial fibrillation  1. Persistent  atrial fibrillation Admitted 04/30/15 for Tikosyn load Continue Xarelto for CHADS2VASC of at least 2 QTc stable this morning Keep K >3.9, Mg> 1.8 DCCV today at 2pm Risks, benefits, and alternatives to cardioversion were discussed with the patient who wishes to proceed.  2. HTN Stable No change required today  3. Obesity Weight loss encouraged  Anticipate discharge tomorrow - will need to follow up in AF clinic in 1 week and 4 weeks and with Dr Marlou Porch in 3 months.    Thompson Grayer, MD 05/02/2015 8:08 AM

## 2015-05-02 NOTE — Anesthesia Preprocedure Evaluation (Signed)
Anesthesia Evaluation  Patient identified by MRN, date of birth, ID band Patient awake    Reviewed: Allergy & Precautions, H&P , NPO status , Patient's Chart, lab work & pertinent test results  History of Anesthesia Complications Negative for: history of anesthetic complications  Airway Mallampati: II  TM Distance: >3 FB Neck ROM: Full    Dental  (+) Teeth Intact, Dental Advisory Given   Pulmonary neg pulmonary ROS, asthma , former smoker,    Pulmonary exam normal       Cardiovascular Exercise Tolerance: Good hypertension, Pt. on medications and Pt. on home beta blockers Rhythm:Irregular Rate:Abnormal + Systolic murmurs    Neuro/Psych negative neurological ROS  negative psych ROS   GI/Hepatic Neg liver ROS, GERD-  Medicated and Controlled,  Endo/Other  negative endocrine ROS  Renal/GU negative Renal ROS  negative genitourinary   Musculoskeletal negative musculoskeletal ROS (+)   Abdominal Normal abdominal exam  (+)   Peds  Hematology negative hematology ROS (+)   Anesthesia Other Findings   Reproductive/Obstetrics negative OB ROS                             Anesthesia Physical Anesthesia Plan  ASA: III  Anesthesia Plan: General   Post-op Pain Management:    Induction: Intravenous  Airway Management Planned: Natural Airway and Mask  Additional Equipment:   Intra-op Plan:   Post-operative Plan:   Informed Consent: I have reviewed the patients History and Physical, chart, labs and discussed the procedure including the risks, benefits and alternatives for the proposed anesthesia with the patient or authorized representative who has indicated his/her understanding and acceptance.   Dental advisory given  Plan Discussed with: Anesthesiologist, Surgeon and CRNA  Anesthesia Plan Comments:         Anesthesia Quick Evaluation

## 2015-05-02 NOTE — Discharge Summary (Signed)
ELECTROPHYSIOLOGY PROCEDURE DISCHARGE SUMMARY    Patient ID: Carrie Potter,  MRN: [id number], DOB/AGE: [birth year] 64 y.o.  Admit date: 04/30/2015 Discharge date: 05/03/2015  Primary Care Physician: Antony Blackbird, MD Primary Cardiologist: Marlou Porch  Primary Discharge Diagnosis:  1.  Longstanding persistent atrial fibrillation status post Tikosyn loading this admission  Secondary Discharge Diagnosis:  1.  Obesity 2.  Hypertension 3.  Hyperlipidemia 4.  Hypothyroidism   Allergies  Allergen Reactions  . Lisinopril Swelling  . Penicillins Other (See Comments)    whelps  . Diltiazem Rash    Rash on arms and face even when rechallenged     Procedures This Admission:  1.  Tikosyn loading 2.  Direct current cardioversion on 05/02/15 by Dr Marlou Porch which successfully restored SR.  There were no early apparent complications.   Brief HPI: Carrie Potter is a 64 y.o. female with a past medical history as noted above.  They were referred to the AF clinic in the outpatient setting for treatment options of atrial fibrillation.  Risks, benefits, and alternatives to Tikosyn were reviewed with the patient who wished to proceed.    Hospital Course:  The patient was admitted and Tikosyn was initiated.  Renal function and electrolytes were followed during the hospitalization.  Their QTc remained stable.  On 05/02/15 they underwent direct current cardioversion which restored sinus bradycardia.  Her Metoprolol was discontinued.  They were monitored until discharge on telemetry which demonstrated sinus rhythm with intermittent atrial fibrillation.  On the day of discharge, they were examined by Dr Rayann Heman who considered them stable for discharge to home.  Follow-up has been arranged with the AF clinic in 1 week and 4 weeks and with Dr Marlou Porch in 3 months.   Continue Xarelto long term for CHADS2VASC of at least 2   She had a post termination pause prior to discharge that was asymptomatic.  Metoprolol  discontinued. Avoid AVN blocking agents if anti-hypertensives are needed.  Physical Exam: Filed Vitals:   05/02/15 1439 05/02/15 1440 05/02/15 2000 05/03/15 0344  BP: 112/42 122/71 157/98 132/73  Pulse: 48 43 90 47  Temp:   98.8 F (37.1 C) 98.2 F (36.8 C)  TempSrc:   Axillary Oral  Resp: 17 15 16 18   Height:      Weight:      SpO2: 96% 97% 98% 98%    GEN- The patient is well appearing, alert and oriented x 3 today.  Head- normocephalic, atraumatic Eyes- Sclera clear, conjunctiva pink Ears- hearing intact Oropharynx- clear Neck- supple,  Lungs- Clear to ausculation bilaterally, normal work of breathing Heart- Irregular rate and rhythm  GI- soft, NT, ND, + BS Extremities- no clubbing, cyanosis, or edema Skin- no rash or lesion Psych- euthymic mood, full affect Neuro- strength and sensation are intact   Labs:   Lab Results  Component Value Date   WBC 8.4 08/14/2013   HGB 12.6 08/14/2013   HCT 37.8 08/14/2013   MCV 102.3* 08/14/2013   PLT 212.0 08/14/2013     Recent Labs Lab 05/03/15 0340  NA 138  K 5.0  CL 103  CO2 28  BUN 14  CREATININE 0.84  CALCIUM 9.0  GLUCOSE 106*     Discharge Medications:    Medication List    STOP taking these medications        metoprolol succinate 50 MG 24 hr tablet  Commonly known as:  TOPROL-XL      TAKE these medications  acetaminophen 500 MG tablet  Commonly known as:  TYLENOL  Take 500-1,000 mg by mouth every 6 (six) hours as needed. For pain     albuterol 108 (90 BASE) MCG/ACT inhaler  Commonly known as:  PROVENTIL HFA;VENTOLIN HFA  Inhale 2 puffs into the lungs every 4 (four) hours as needed. Shortness of breath     dofetilide 500 MCG capsule  Commonly known as:  TIKOSYN  Take 1 capsule (500 mcg total) by mouth 2 (two) times daily.     levothyroxine 125 MCG tablet  Commonly known as:  SYNTHROID, LEVOTHROID  Take 125 mcg by mouth daily.     PROBIOTIC PO  Take 1 tablet by mouth daily.       RA GUMMY VITAMINS & MINERALS PO  Take 1 tablet by mouth daily.     rivaroxaban 20 MG Tabs tablet  Commonly known as:  XARELTO  Take 1 tablet (20 mg total) by mouth daily.     tolterodine 2 MG tablet  Commonly known as:  DETROL  Take 2 mg by mouth daily.     valACYclovir 1000 MG tablet  Commonly known as:  VALTREX  Take 1,000 mg by mouth 2 (two) times daily as needed. Cold sore        Disposition:  Discharge Instructions    Diet - low sodium heart healthy    Complete by:  As directed      Increase activity slowly    Complete by:  As directed           Follow-up Information    Follow up with [facility] On 05/09/2015.   Why:  at 9:30AM   Contact information:   [place] [phone]      Follow up with [facility] On 05/30/2015.   Why:  at [facility] information:   [place] [phone]      Follow up with Candee Furbish, MD On 08/12/2015.   Specialty:  Cardiology   Why:  at 10:45AM   Contact information:   [address] [place] [address] [phone]       Duration of Discharge Encounter: Greater than 30 minutes including physician time.  Signed, Chanetta Marshall, NP 05/03/2015 9:56 AM     I have seen, examined the patient, and reviewed the above assessment and plan.  Changes to above are made where necessary.   No driving x 1 week given post termination pauses.  Will stop toprol and monitor.   Co Sign: Thompson Grayer, MD 05/03/2015 11:49 AM

## 2015-05-02 NOTE — Progress Notes (Addendum)
Patient brought back from endo after cardioversion. HR 40's in SB. Will continue to monitor.  Verma Grothaus, Mervin Kung RN

## 2015-05-02 NOTE — H&P (View-Only) (Signed)
   SUBJECTIVE: The patient is doing well today.  At this time, she denies chest pain, shortness of breath, or any new concerns.  She says that her Phyllis Ginger was not given last night, however there is no documentation that it was held.  CURRENT MEDICATIONS: . dofetilide  500 mcg Oral BID  . levothyroxine  125 mcg Oral QAC breakfast  . metoprolol succinate  50 mg Oral q morning - 10a   And  . metoprolol succinate  25 mg Oral QPM  . oxybutynin  5 mg Oral QHS  . rivaroxaban  20 mg Oral Q supper  . sodium chloride  3 mL Intravenous Q12H      OBJECTIVE: Physical Exam: Filed Vitals:   05/01/15 0449 05/01/15 1500 05/01/15 2034 05/02/15 0617  BP: 141/75 156/95 161/89 121/55  Pulse: 71 80 81 63  Temp: 97.8 F (36.6 C) 98.2 F (36.8 C) 98.1 F (36.7 C) 97.6 F (36.4 C)  TempSrc: Oral Oral Oral Oral  Resp: 19 18 18 18   Height:      Weight:      SpO2: 98% 100% 98% 95%    Intake/Output Summary (Last 24 hours) at 05/02/15 9977 Last data filed at 05/01/15 2003  Gross per 24 hour  Intake    840 ml  Output      0 ml  Net    840 ml    Telemetry reveals atrial fibrillation with controlled ventricular response  GEN- The patient is well appearing, alert and oriented x 3 today.   Head- normocephalic, atraumatic Eyes-  Sclera clear, conjunctiva pink Ears- hearing intact Oropharynx- clear Neck- supple,   Lungs- Clear to ausculation bilaterally, normal work of breathing Heart- Irregular rate and rhythm  GI- soft, NT, ND, + BS Extremities- no clubbing, cyanosis, or edema Skin- no rash or lesion Psych- euthymic mood, full affect Neuro- strength and sensation are intact  LABS: Basic Metabolic Panel:  Recent Labs  05/01/15 0439 05/02/15 0356  NA 137 141  K 3.9 4.6  CL 104 106  CO2 26 29  GLUCOSE 101* 99  BUN 16 15  CREATININE 0.81 0.76  CALCIUM 8.6* 9.3  MG 2.0 2.2   EKG- afib, Qtc 442  ASSESSMENT AND PLAN:  Active Problems:   Atrial fibrillation  1. Persistent  atrial fibrillation Admitted 04/30/15 for Tikosyn load Continue Xarelto for CHADS2VASC of at least 2 QTc stable this morning Keep K >3.9, Mg> 1.8 DCCV today at 2pm Risks, benefits, and alternatives to cardioversion were discussed with the patient who wishes to proceed.  2. HTN Stable No change required today  3. Obesity Weight loss encouraged  Anticipate discharge tomorrow - will need to follow up in AF clinic in 1 week and 4 weeks and with Dr Marlou Porch in 3 months.    Thompson Grayer, MD 05/02/2015 8:08 AM

## 2015-05-02 NOTE — Anesthesia Postprocedure Evaluation (Signed)
  Anesthesia Post-op Note  Patient: Carrie Potter  Procedure(s) Performed: Procedure(s): CARDIOVERSION (N/A)  Patient Location: PACU  Anesthesia Type:General  Level of Consciousness: awake and alert   Airway and Oxygen Therapy: Patient Spontanous Breathing  Post-op Pain: none  Post-op Assessment: Post-op Vital signs reviewed              Post-op Vital Signs: Reviewed  Last Vitals:  Filed Vitals:   05/02/15 1440  BP: 122/71  Pulse: 43  Temp:   Resp: 15    Complications: No apparent anesthesia complications

## 2015-05-02 NOTE — Transfer of Care (Signed)
Immediate Anesthesia Transfer of Care Note  Patient: Carrie Potter  Procedure(s) Performed: Procedure(s): CARDIOVERSION (N/A)  Patient Location: PACU  Anesthesia Type:General  Level of Consciousness: awake, alert , oriented and patient cooperative  Airway & Oxygen Therapy: Patient Spontanous Breathing and Patient connected to nasal cannula oxygen  Post-op Assessment: Report given to RN, Post -op Vital signs reviewed and stable and Patient moving all extremities  Post vital signs: Reviewed and stable  Last Vitals:  Filed Vitals:   05/02/15 1308  BP: 174/102  Pulse:   Temp: 36.6 C  Resp: 18    Complications: No apparent anesthesia complications

## 2015-05-02 NOTE — Progress Notes (Signed)
Pt in SB post cardioversion. QTc stable. Will discontinue Metoprolol.  Continue Tikosyn without interruption.  Chanetta Marshall, NP 05/02/2015 4:54 PM

## 2015-05-03 ENCOUNTER — Encounter (HOSPITAL_COMMUNITY): Payer: Self-pay | Admitting: Cardiology

## 2015-05-03 LAB — BASIC METABOLIC PANEL
Anion gap: 7 (ref 5–15)
BUN: 14 mg/dL (ref 6–20)
CHLORIDE: 103 mmol/L (ref 101–111)
CO2: 28 mmol/L (ref 22–32)
CREATININE: 0.84 mg/dL (ref 0.44–1.00)
Calcium: 9 mg/dL (ref 8.9–10.3)
GFR calc Af Amer: >60 mL/min (ref >60)
GFR calc non Af Amer: >60 mL/min (ref >60)
Glucose, Bld: 106 mg/dL — ABNORMAL HIGH (ref 65–99)
POTASSIUM: 5 mmol/L (ref 3.5–5.1)
SODIUM: 138 mmol/L (ref 135–145)

## 2015-05-03 LAB — MAGNESIUM: Magnesium: 2.1 mg/dL (ref 1.7–2.4)

## 2015-05-03 MED ORDER — DOFETILIDE 500 MCG PO CAPS
500.0000 ug | ORAL_CAPSULE | Freq: Two times a day (BID) | ORAL | Status: DC
Start: 2015-05-03 — End: 2015-09-10

## 2015-05-03 MED ORDER — DOFETILIDE 500 MCG PO CAPS: 500.0000 ug | ORAL_CAPSULE | Freq: Two times a day (BID) | ORAL | Status: DC

## 2015-05-03 NOTE — Progress Notes (Signed)
Patient d/ced. Medications given as ordered and instructions given. Assessments remained unchanged prior to d/c.

## 2015-05-03 NOTE — Progress Notes (Signed)
Pt had 7.38 second pause, pt reports feeling faint but denies SOB or pain, EKG completed displaying sinus bradycardia, pt was previously in atrial fibrillation, pt now reports feeling fine, pt still denies SOB, pain, or nausea, MD made aware, RN will continue to monitor for changes, pt call bell within reach, family at bedside, and bed in lowest position.  Izola Price, RN 05/03/2015

## 2015-05-06 ENCOUNTER — Telehealth (HOSPITAL_COMMUNITY): Payer: Self-pay | Admitting: Nurse Practitioner

## 2015-05-06 NOTE — Telephone Encounter (Signed)
Pt called to say that she had increase in heart rate and BP over the weekend. BB stopped on discharge from hospital due to [facility] when in Sinus rhythm. Feels like back in afib.  She also had a low grade fever and diarrhea on Saturday which has resolved.  Instructed to take 25 mg metoprolol along with tikosyn this am and call back this afternoon with BP/HR readings.

## 2015-05-07 ENCOUNTER — Encounter (HOSPITAL_COMMUNITY): Payer: Self-pay | Admitting: Nurse Practitioner

## 2015-05-07 ENCOUNTER — Ambulatory Visit (HOSPITAL_COMMUNITY)
Admission: RE | Admit: 2015-05-07 | Discharge: 2015-05-07 | Disposition: A | Discharge status: Home / Self Care | Payer: BC Managed Care – PPO | Source: Ambulatory Visit | Attending: Nurse Practitioner | Admitting: Nurse Practitioner

## 2015-05-07 VITALS — BP 144/80 | HR 93 | Ht 64.0 in | Wt 227.0 lb

## 2015-05-07 DIAGNOSIS — Z825 Family history of asthma and other chronic lower respiratory diseases: Secondary | ICD-10-CM | POA: Diagnosis not present

## 2015-05-07 DIAGNOSIS — J45909 Unspecified asthma, uncomplicated: Secondary | ICD-10-CM | POA: Diagnosis not present

## 2015-05-07 DIAGNOSIS — Z7902 Long term (current) use of antithrombotics/antiplatelets: Secondary | ICD-10-CM | POA: Insufficient documentation

## 2015-05-07 DIAGNOSIS — I10 Essential (primary) hypertension: Secondary | ICD-10-CM | POA: Insufficient documentation

## 2015-05-07 DIAGNOSIS — Z87891 Personal history of nicotine dependence: Secondary | ICD-10-CM | POA: Insufficient documentation

## 2015-05-07 DIAGNOSIS — I4819 Other persistent atrial fibrillation: Secondary | ICD-10-CM

## 2015-05-07 DIAGNOSIS — Z880 Allergy status to penicillin: Secondary | ICD-10-CM | POA: Insufficient documentation

## 2015-05-07 DIAGNOSIS — Z8249 Family history of ischemic heart disease and other diseases of the circulatory system: Secondary | ICD-10-CM | POA: Insufficient documentation

## 2015-05-07 DIAGNOSIS — Z6838 Body mass index (BMI) 38.0-38.9, adult: Secondary | ICD-10-CM | POA: Insufficient documentation

## 2015-05-07 DIAGNOSIS — E669 Obesity, unspecified: Secondary | ICD-10-CM | POA: Insufficient documentation

## 2015-05-07 DIAGNOSIS — I481 Persistent atrial fibrillation: Secondary | ICD-10-CM | POA: Diagnosis present

## 2015-05-07 DIAGNOSIS — E785 Hyperlipidemia, unspecified: Secondary | ICD-10-CM | POA: Diagnosis not present

## 2015-05-07 DIAGNOSIS — E039 Hypothyroidism, unspecified: Secondary | ICD-10-CM | POA: Insufficient documentation

## 2015-05-07 DIAGNOSIS — Z79899 Other long term (current) drug therapy: Secondary | ICD-10-CM | POA: Insufficient documentation

## 2015-05-07 NOTE — CV Procedure (Signed)
    Electrical Cardioversion Procedure Note Carrie Potter [id number] 11-03-1950  Procedure: Electrical Cardioversion Indications:  Atrial Fibrillation  Time Out: Verified patient identification, verified procedure,medications/allergies/relevent history reviewed, required imaging and test results available.  Performed  Procedure Details  The patient was NPO after midnight. Anesthesia was administered at the beside  by Dr. Ola Spurr with  propofol.  Cardioversion was performed with synchronized biphasic defibrillation via AP pads with 120 joules.  1 attempt(s) were performed.  The patient converted to normal sinus rhythm. The patient tolerated the procedure well   IMPRESSION:  Successful cardioversion of atrial fibrillation Bradycardia noted, stopped metoprolol Continue Tikosyn.     Carrie Potter, [place] 05/07/2015, 6:52 AM

## 2015-05-07 NOTE — Progress Notes (Signed)
Patient ID: Carrie Potter, female   DOB: 14-Aug-1951, 65 y.o.   MRN: [id number]     Primary Care Physician: Antony Blackbird, MD Referring Physician: Dr. Rayann Heman Cardiologist:Dr. Kelie Gainey is a 64 y.o. female with a h/o lonstanding persistent afib that was hospitalized 8/16 thru 8/18 for tikosyn load and required cardioversion with subsequent Sinus brady in the 40's and BB was held on discharge.QTc remained stable. Renal function, mag and kt all was satisfactory.   She returns today, after calling office yesterday reporting increase of heart rate and BP. Reports that Saturday pm she went back into afib. I told her yesterday to restart BB 25 mg bid. She also reports that she had fever and diarrhea Saturday thru Sunday, but not this is resolved. Has continued xarelto without interruption.  Today, she denies symptoms of palpitations, chest pain, shortness of breath, orthopnea, PND, lower extremity edema, dizziness, presyncope, syncope, or neurologic sequela. The patient is tolerating medications without difficulties and is otherwise without complaint today.   Past Medical History  Diagnosis Date  . HTN (hypertension)   . Asthma   . Hyperlipidemia   . Hypothyroidism   . Obesity   . Palpitation   . Atrial fibrillation   . Walking pneumonia ~ 10/2014   Past Surgical History  Procedure Laterality Date  . Cardioversion  03/08/2012    Procedure: CARDIOVERSION;  Surgeon: Candee Furbish, MD;  Location: [facility] OR;  Service: Cardiovascular;  Laterality: N/A;  . Tonsillectomy    . Dilation and curettage of uterus    . Abdominal hysterectomy    . Tubal ligation    . Fracture surgery    . Ankle fracture surgery Right ?1999  . Cardioversion N/A 05/02/2015    Procedure: CARDIOVERSION;  Surgeon: Jerline Pain, MD;  Location: [facility] ENDOSCOPY;  Service: Cardiovascular;  Laterality: N/A;    Current Outpatient Prescriptions  Medication Sig Dispense Refill  . acetaminophen (TYLENOL) 500 MG tablet  Take 500-1,000 mg by mouth every 6 (six) hours as needed. For pain    . albuterol (PROVENTIL HFA;VENTOLIN HFA) 108 (90 BASE) MCG/ACT inhaler Inhale 2 puffs into the lungs every 4 (four) hours as needed. Shortness of breath    . dofetilide (TIKOSYN) 500 MCG capsule Take 1 capsule (500 mcg total) by mouth 2 (two) times daily. 180 capsule 3  . levothyroxine (SYNTHROID, LEVOTHROID) 125 MCG tablet Take 125 mcg by mouth daily.    . Pediatric Multivit-Minerals-C (RA GUMMY VITAMINS & MINERALS PO) Take 1 tablet by mouth daily.    . Probiotic Product (PROBIOTIC PO) Take 1 tablet by mouth daily.    . rivaroxaban (XARELTO) 20 MG TABS tablet Take 1 tablet (20 mg total) by mouth daily. 30 tablet 11  . tolterodine (DETROL) 2 MG tablet Take 2 mg by mouth daily.    . metoprolol succinate (TOPROL-XL) 50 MG 24 hr tablet Take 0.5 mg by mouth 2 (two) times daily.    . triazolam (HALCION) 0.25 MG tablet Take 1 tablet by mouth as needed. Take before Dental appts.  0  . valACYclovir (VALTREX) 1000 MG tablet Take 1,000 mg by mouth 2 (two) times daily as needed. Cold sore     No current facility-administered medications for this encounter.    Allergies  Allergen Reactions  . Lisinopril Swelling  . Penicillins Other (See Comments)    whelps  . Diltiazem Rash    Rash on arms and face even when rechallenged    Social History  Social History  . Marital Status: Married    Spouse Name: N/A  . Number of Children: N/A  . Years of Education: N/A   Occupational History  . Not on file.   Social History Main Topics  . Smoking status: Former Smoker -- 1.00 packs/day for 3 years    Types: Cigarettes  . Smokeless tobacco: Never Used     Comment: "quit smoking ~ 1976"  . Alcohol Use: 2.4 oz/week    4 Cans of beer per week  . Drug Use: No  . Sexual Activity: Yes   Other Topics Concern  . Not on file   Social History Narrative    Family History  Problem Relation Age of Onset  . COPD Father   . Heart  disease Father   . Cancer Father     skin  . Breast cancer Mother   . Cancer Mother     skin  . Hypertension Mother     ROS- All systems are reviewed and negative except as per the HPI above  Physical Exam: Filed Vitals:   05/07/15 0939  BP: 144/80  Pulse: 93  Height: 5\' 4"  (1.626 m)  Weight: 227 lb (102.967 kg)    GEN- The patient is well appearing, alert and oriented x 3 today.   Head- normocephalic, atraumatic Eyes-  Sclera clear, conjunctiva pink Ears- hearing intact Oropharynx- clear Neck- supple, no JVP Lymph- no cervical lymphadenopathy Lungs- Clear to ausculation bilaterally, normal work of breathing Heart- Irregular rate and rhythm, no murmurs, rubs or gallops, PMI not laterally displaced GI- soft, NT, ND, + BS Extremities- no clubbing, cyanosis, or edema MS- no significant deformity or atrophy Skin- no rash or lesion Psych- euthymic mood, full affect Neuro- strength and sensation are intact  EKG- Afib at 93 bpm, QRS int 80 ms, QTc 504 ms.(QTc ok pr Dr. Rayann Heman) Epic records reviewed Labs: 8/19 Creatinine 0.84 ms, Kt 5.0, Magnesium 2.1 ms   Assessment and Plan: 1. Longstanding persistent afib Converted initially with tikosyn and cardioversion but unfortunately has returned to afib Increase BB, metoprolol succinate to 50 mg bid for better rate control and BP management  After discussion with Dr. Rayann Heman, will continue tikosyn at 500 mg bid will bring back in one week and if still in afib will plan for repeat cardioversion Pt aware not to miss any doses of DOAC She had brady after last cardioversion, so will plan to hold am dose of BB, but if rate is ok will resume that night.  2. HTN Improved with resuming BB Will increase again today  F/u in one week.  Geroge Baseman Caitlinn Klinker, [facility] [address] [place], [place] [address] [phone]

## 2015-05-09 ENCOUNTER — Encounter (HOSPITAL_COMMUNITY): Payer: BC Managed Care – PPO | Admitting: Nurse Practitioner

## 2015-05-14 ENCOUNTER — Ambulatory Visit (HOSPITAL_COMMUNITY)
Admission: RE | Admit: 2015-05-14 | Discharge: 2015-05-14 | Disposition: A | Discharge status: Home / Self Care | Payer: BC Managed Care – PPO | Source: Ambulatory Visit | Attending: Nurse Practitioner | Admitting: Nurse Practitioner

## 2015-05-14 VITALS — BP 142/80 | HR 76 | Ht 64.0 in | Wt 227.0 lb

## 2015-05-14 DIAGNOSIS — I481 Persistent atrial fibrillation: Secondary | ICD-10-CM | POA: Insufficient documentation

## 2015-05-14 DIAGNOSIS — I10 Essential (primary) hypertension: Secondary | ICD-10-CM | POA: Diagnosis not present

## 2015-05-14 DIAGNOSIS — I4819 Other persistent atrial fibrillation: Secondary | ICD-10-CM

## 2015-05-14 LAB — CBC
HCT: 45.7 % (ref 36.0–46.0)
Hemoglobin: 15.2 g/dL — ABNORMAL HIGH (ref 12.0–15.0)
MCH: 35.7 pg — AB (ref 26.0–34.0)
MCHC: 33.3 g/dL (ref 30.0–36.0)
MCV: 107.3 fL — ABNORMAL HIGH (ref 78.0–100.0)
PLATELETS: 263 10*3/uL (ref 150–400)
RBC: 4.26 MIL/uL (ref 3.87–5.11)
RDW: 12.7 % (ref 11.5–15.5)
WBC: 8.9 10*3/uL (ref 4.0–10.5)

## 2015-05-14 LAB — BASIC METABOLIC PANEL
Anion gap: 7 (ref 5–15)
BUN: 15 mg/dL (ref 6–20)
CALCIUM: 9.6 mg/dL (ref 8.9–10.3)
CO2: 28 mmol/L (ref 22–32)
CREATININE: 0.72 mg/dL (ref 0.44–1.00)
Chloride: 102 mmol/L (ref 101–111)
GFR calc Af Amer: >60 mL/min (ref >60)
GLUCOSE: 90 mg/dL (ref 65–99)
Potassium: 4.5 mmol/L (ref 3.5–5.1)
SODIUM: 137 mmol/L (ref 135–145)

## 2015-05-14 LAB — MAGNESIUM: MAGNESIUM: 2.2 mg/dL (ref 1.7–2.4)

## 2015-05-14 NOTE — Patient Instructions (Signed)
Cardioversion scheduled for Thursday, September 1st  - Arrive at the [facility] and go to admitting at [facility] not eat or drink anything after midnight the night prior to your procedure.  - Take all your medication with a sip of water prior to arrival.  - You will not be able to drive home after your procedure.

## 2015-05-15 NOTE — Progress Notes (Signed)
Patient ID: MAKAIAH TERWILLIGER, female   DOB: Aug 25, 1951, 64 y.o.   MRN: [id number]      Primary Care Physician: Antony Blackbird, MD Referring Physician: Dr. Rayann Heman Cardiologist:Dr. Diasha Castleman is a 64 y.o. female with a h/o lonstanding persistent afib that was hospitalized 8/16 thru 8/18 for tikosyn load and required cardioversion with subsequent Sinus brady in the 40's and BB was held on discharge.QTc remained stable. Renal function, mag and kt all was satisfactory.   She returned last week, after calling office yesterday reporting increase of heart rate and BP. Reports that Saturday pm she went back into afib. She was  told  yesterday to restart BB 25 mg bid. She also reports that she had fever and diarrhea Saturday thru Sunday, but  this is resolved. Has continued xarelto without interruption.  She returns today in rate controlled afib. When I discussed her with Dr. Rayann Heman last week, the plan was to load on tikosyn for another week and then to try DCCV again. She will be set up for repeat cardioversion. No missed doses of NOAC.  Today, she denies symptoms of palpitations, chest pain, shortness of breath, orthopnea, PND, lower extremity edema, dizziness, presyncope, syncope, or neurologic sequela. The patient is tolerating medications without difficulties and is otherwise without complaint today.   Past Medical History  Diagnosis Date  . HTN (hypertension)   . Asthma   . Hyperlipidemia   . Hypothyroidism   . Obesity   . Palpitation   . Atrial fibrillation   . Walking pneumonia ~ 10/2014   Past Surgical History  Procedure Laterality Date  . Cardioversion  03/08/2012    Procedure: CARDIOVERSION;  Surgeon: Candee Furbish, MD;  Location: [facility] OR;  Service: Cardiovascular;  Laterality: N/A;  . Tonsillectomy    . Dilation and curettage of uterus    . Abdominal hysterectomy    . Tubal ligation    . Fracture surgery    . Ankle fracture surgery Right ?1999  . Cardioversion N/A  05/02/2015    Procedure: CARDIOVERSION;  Surgeon: Jerline Pain, MD;  Location: [facility] ENDOSCOPY;  Service: Cardiovascular;  Laterality: N/A;    Current Outpatient Prescriptions  Medication Sig Dispense Refill  . acetaminophen (TYLENOL) 500 MG tablet Take 500-1,000 mg by mouth every 6 (six) hours as needed. For pain    . albuterol (PROVENTIL HFA;VENTOLIN HFA) 108 (90 BASE) MCG/ACT inhaler Inhale 2 puffs into the lungs every 4 (four) hours as needed. Shortness of breath    . dofetilide (TIKOSYN) 500 MCG capsule Take 1 capsule (500 mcg total) by mouth 2 (two) times daily. 180 capsule 3  . levothyroxine (SYNTHROID, LEVOTHROID) 125 MCG tablet Take 125 mcg by mouth daily.    . metoprolol succinate (TOPROL-XL) 50 MG 24 hr tablet Take 1 mg by mouth 2 (two) times daily.     . Pediatric Multivit-Minerals-C (RA GUMMY VITAMINS & MINERALS PO) Take 1 tablet by mouth daily.    . Probiotic Product (PROBIOTIC PO) Take 1 tablet by mouth daily.    . rivaroxaban (XARELTO) 20 MG TABS tablet Take 1 tablet (20 mg total) by mouth daily. 30 tablet 11  . tolterodine (DETROL) 2 MG tablet Take 2 mg by mouth daily.    . triazolam (HALCION) 0.25 MG tablet Take 1 tablet by mouth as needed. Take before Dental appts.  0  . valACYclovir (VALTREX) 1000 MG tablet Take 1,000 mg by mouth 2 (two) times daily as needed. Cold sore  No current facility-administered medications for this encounter.    Allergies  Allergen Reactions  . Lisinopril Swelling  . Penicillins Other (See Comments)    whelps  . Diltiazem Rash    Rash on arms and face even when rechallenged    Social History   Social History  . Marital Status: Married    Spouse Name: N/A  . Number of Children: N/A  . Years of Education: N/A   Occupational History  . Not on file.   Social History Main Topics  . Smoking status: Former Smoker -- 1.00 packs/day for 3 years    Types: Cigarettes  . Smokeless tobacco: Never Used     Comment: "quit smoking ~ 1976"  .  Alcohol Use: 2.4 oz/week    4 Cans of beer per week  . Drug Use: No  . Sexual Activity: Yes   Other Topics Concern  . Not on file   Social History Narrative    Family History  Problem Relation Age of Onset  . COPD Father   . Heart disease Father   . Cancer Father     skin  . Breast cancer Mother   . Cancer Mother     skin  . Hypertension Mother     ROS- All systems are reviewed and negative except as per the HPI above  Physical Exam: Filed Vitals:   05/14/15 1527  BP: 142/80  Pulse: 76  Height: 5\' 4"  (1.626 m)  Weight: 227 lb (102.967 kg)    GEN- The patient is well appearing, alert and oriented x 3 today.   Head- normocephalic, atraumatic Eyes-  Sclera clear, conjunctiva pink Ears- hearing intact Oropharynx- clear Neck- supple, no JVP Lymph- no cervical lymphadenopathy Lungs- Clear to ausculation bilaterally, normal work of breathing Heart- Irregular rate and rhythm, no murmurs, rubs or gallops, PMI not laterally displaced GI- soft, NT, ND, + BS Extremities- no clubbing, cyanosis, or edema MS- no significant deformity or atrophy Skin- no rash or lesion Psych- euthymic mood, full affect Neuro- strength and sensation are intact  EKG- Afib at 93 bpm, QRS int 80 ms, QTc 504 ms.(QTc ok pr Dr. Rayann Heman) Epic records reviewed Labs: 8/19 Creatinine 0.84 ms, Kt 5.0, Magnesium 2.1 ms   Assessment and Plan: 1. Longstanding persistent afib Converted initially with tikosyn and cardioversion but unfortunately has returned to afib Coninue BB, metoprolol succinate to 50 mg bid but hold am of procedure due to brady on return to SR with last cardioversion. May only need 1/2 dose that pm following DCCV depending on heart rate. Pt aware not to miss any doses of DOAC  2. HTN Improved with resuming BB   F/u as scheduled in afib clinic in two weeks, sooner if needed.  Geroge Baseman Havoc Sanluis, [facility] [address] [place], [place]  [address] [phone]

## 2015-05-15 NOTE — Addendum Note (Signed)
Encounter addended by: Sherran Needs, NP on: 05/15/2015  8:36 AM<BR>     Documentation filed: Follow-up Section, LOS Section

## 2015-05-16 ENCOUNTER — Ambulatory Visit (HOSPITAL_COMMUNITY)
Admission: RE | Admit: 2015-05-16 | Discharge: 2015-05-16 | Disposition: A | Discharge status: Home / Self Care | Payer: BC Managed Care – PPO | Source: Ambulatory Visit | Attending: Cardiology | Admitting: Cardiology

## 2015-05-16 ENCOUNTER — Other Ambulatory Visit (HOSPITAL_COMMUNITY): Payer: Self-pay | Admitting: *Deleted

## 2015-05-16 ENCOUNTER — Encounter (HOSPITAL_COMMUNITY)
Admission: RE | Disposition: A | Discharge status: Home / Self Care | Payer: Self-pay | Source: Ambulatory Visit | Attending: Cardiology

## 2015-05-16 ENCOUNTER — Ambulatory Visit (HOSPITAL_COMMUNITY): Payer: BC Managed Care – PPO | Admitting: Anesthesiology

## 2015-05-16 ENCOUNTER — Encounter (HOSPITAL_COMMUNITY): Payer: Self-pay

## 2015-05-16 DIAGNOSIS — I481 Persistent atrial fibrillation: Secondary | ICD-10-CM | POA: Insufficient documentation

## 2015-05-16 DIAGNOSIS — Z7901 Long term (current) use of anticoagulants: Secondary | ICD-10-CM | POA: Diagnosis not present

## 2015-05-16 DIAGNOSIS — Z79899 Other long term (current) drug therapy: Secondary | ICD-10-CM | POA: Diagnosis not present

## 2015-05-16 DIAGNOSIS — E669 Obesity, unspecified: Secondary | ICD-10-CM | POA: Diagnosis not present

## 2015-05-16 DIAGNOSIS — I4891 Unspecified atrial fibrillation: Secondary | ICD-10-CM | POA: Diagnosis present

## 2015-05-16 DIAGNOSIS — E785 Hyperlipidemia, unspecified: Secondary | ICD-10-CM | POA: Insufficient documentation

## 2015-05-16 DIAGNOSIS — I10 Essential (primary) hypertension: Secondary | ICD-10-CM | POA: Diagnosis not present

## 2015-05-16 DIAGNOSIS — Z87891 Personal history of nicotine dependence: Secondary | ICD-10-CM | POA: Diagnosis not present

## 2015-05-16 DIAGNOSIS — E039 Hypothyroidism, unspecified: Secondary | ICD-10-CM | POA: Diagnosis not present

## 2015-05-16 DIAGNOSIS — J45909 Unspecified asthma, uncomplicated: Secondary | ICD-10-CM | POA: Insufficient documentation

## 2015-05-16 HISTORY — DX: Cardiac arrhythmia, unspecified: I49.9

## 2015-05-16 HISTORY — PX: CARDIOVERSION: SHX1299

## 2015-05-16 SURGERY — CARDIOVERSION
Anesthesia: Monitor Anesthesia Care

## 2015-05-16 MED ORDER — PROPOFOL 10 MG/ML IV BOLUS
INTRAVENOUS | Status: DC | PRN
  Administered 2015-05-16: 40 mg via INTRAVENOUS
  Administered 2015-05-16: 15 mg via INTRAVENOUS

## 2015-05-16 MED ORDER — SODIUM CHLORIDE 0.9 % IV SOLN
INTRAVENOUS | Status: DC
  Administered 2015-05-16: 500 mL via INTRAVENOUS

## 2015-05-16 MED ORDER — LIDOCAINE HCL (CARDIAC) 20 MG/ML IV SOLN
INTRAVENOUS | Status: DC | PRN
  Administered 2015-05-16: 30 mg via INTRAVENOUS

## 2015-05-16 MED ORDER — AMLODIPINE BESYLATE 2.5 MG PO TABS: 2.5000 mg | ORAL_TABLET | Freq: Every day | ORAL | Status: DC

## 2015-05-16 MED ORDER — METOPROLOL SUCCINATE ER 25 MG PO TB24: 25.0000 mg | ORAL_TABLET | Freq: Every day | ORAL | Status: DC

## 2015-05-16 NOTE — Interval H&P Note (Signed)
History and Physical Interval Note:  05/16/2015 2:10 PM  Carrie Potter  has presented today for surgery, with the diagnosis of A FIB  The various methods of treatment have been discussed with the patient and family. After consideration of risks, benefits and other options for treatment, the patient has consented to  Procedure(s): CARDIOVERSION (N/A) as a surgical intervention .  The patient's history has been reviewed, patient examined, no change in status, stable for surgery.  I have reviewed the patient's chart and labs.  Questions were answered to the patient's satisfaction.     Davida Falconi R

## 2015-05-16 NOTE — Anesthesia Preprocedure Evaluation (Addendum)
Anesthesia Evaluation  Patient identified by MRN, date of birth, ID band Patient awake    Reviewed: Allergy & Precautions, NPO status , Patient's Chart, lab work & pertinent test results  Airway Mallampati: III   Neck ROM: Full    Dental  (+) Dental Advisory Given, Teeth Intact   Pulmonary asthma , former smoker,  breath sounds clear to auscultation        Cardiovascular hypertension, Pt. on medications Rhythm:Irregular  ECHO 12/2014 EF 60%   Neuro/Psych    GI/Hepatic negative GI ROS, Neg liver ROS,   Endo/Other  Hypothyroidism   Renal/GU negative Renal ROS     Musculoskeletal   Abdominal (+)  Abdomen: soft.    Peds  Hematology 15/45   Anesthesia Other Findings   Reproductive/Obstetrics                            Anesthesia Physical Anesthesia Plan  ASA: III  Anesthesia Plan: MAC   Post-op Pain Management:    Induction: Intravenous  Airway Management Planned: Nasal Cannula and Natural Airway  Additional Equipment:   Intra-op Plan:   Post-operative Plan:   Informed Consent: I have reviewed the patients History and Physical, chart, labs and discussed the procedure including the risks, benefits and alternatives for the proposed anesthesia with the patient or authorized representative who has indicated his/her understanding and acceptance.     Plan Discussed with:   Anesthesia Plan Comments:         Anesthesia Quick Evaluation

## 2015-05-16 NOTE — Transfer of Care (Signed)
Immediate Anesthesia Transfer of Care Note  Patient: Carrie Potter  Procedure(s) Performed: Procedure(s): CARDIOVERSION (N/A)  Patient Location: PACU and Endoscopy Unit  Anesthesia Type:MAC  Level of Consciousness: awake, alert , oriented and sedated  Airway & Oxygen Therapy: Patient Spontanous Breathing and Patient connected to nasal cannula oxygen  Post-op Assessment: Report given to RN, Post -op Vital signs reviewed and stable and Patient moving all extremities  Post vital signs: Reviewed and stable  Last Vitals:  Filed Vitals:   05/16/15 1345  BP: 153/96  Pulse: 93  Temp: 36.8 C  Resp: 22    Complications: No apparent anesthesia complications

## 2015-05-16 NOTE — Telephone Encounter (Signed)
Pt called in after being discharged from cardioversion concerned about next steps with medications since HR is upper 30 to low 40s post cardioversion.  Roderic Palau NP discussed with Dr. Rayann Heman and plan is to take toprol 25mg  once a day as long as HR>55.  Patient is concerned with BP going up if not taking full dose of metoprolol so decision was made to start on low dose norvasc 2.5mg  once a day.  Appointment was made for 9/6 for follow up; patient is instructed to call physician on call if further issues prior to appointment on Tuesday. Patient verbalized understanding and prescription was sent to pharmacy for norvasc.

## 2015-05-16 NOTE — CV Procedure (Addendum)
   Electrical Cardioversion Procedure Note KYERA FELAN [id number] 1950-12-01  Procedure: Electrical Cardioversion Indications:  Atrial Fibrillation  Time Out: Verified patient identification, verified procedure,medications/allergies/relevent history reviewed, required imaging and test results available.  Performed  Procedure Details  The patient was NPO after midnight. Anesthesia was administered at the beside  by [facility] with 55mg  of propofol and Lidocaine 30mg .  Cardioversion was done with synchronized biphasic defibrillation with AP pads with 150watts.  The patient did not convert to normal sinus rhythm.  Repeat cardioversion was done with synchronized biphasic defibrillation with AP pads with 200watts.   The patient converted to profound bradycardia with 7 seconds of asystole and then junctional bradycardia.  Currently in sinus bradycardia in the 34-45bpm range with PAC's.  The patient tolerated the procedure well   IMPRESSION:  Successful cardioversion of atrial fibrillation    TURNER,TRACI R 05/16/2015, 2:11 PM

## 2015-05-16 NOTE — H&P (View-Only) (Signed)
Patient ID: Carrie Potter, female   DOB: 06-Jul-1951, 64 y.o.   MRN: [id number]      Primary Care Physician: Antony Blackbird, MD Referring Physician: Dr. Rayann Heman Cardiologist:Dr. Shamaria Kavan is a 64 y.o. female with a h/o lonstanding persistent afib that was hospitalized 8/16 thru 8/18 for tikosyn load and required cardioversion with subsequent Sinus brady in the 40's and BB was held on discharge.QTc remained stable. Renal function, mag and kt all was satisfactory.   She returned last week, after calling office yesterday reporting increase of heart rate and BP. Reports that Saturday pm she went back into afib. She was  told  yesterday to restart BB 25 mg bid. She also reports that she had fever and diarrhea Saturday thru Sunday, but  this is resolved. Has continued xarelto without interruption.  She returns today in rate controlled afib. When I discussed her with Dr. Rayann Heman last week, the plan was to load on tikosyn for another week and then to try DCCV again. She will be set up for repeat cardioversion. No missed doses of NOAC.  Today, she denies symptoms of palpitations, chest pain, shortness of breath, orthopnea, PND, lower extremity edema, dizziness, presyncope, syncope, or neurologic sequela. The patient is tolerating medications without difficulties and is otherwise without complaint today.   Past Medical History  Diagnosis Date  . HTN (hypertension)   . Asthma   . Hyperlipidemia   . Hypothyroidism   . Obesity   . Palpitation   . Atrial fibrillation   . Walking pneumonia ~ 10/2014   Past Surgical History  Procedure Laterality Date  . Cardioversion  03/08/2012    Procedure: CARDIOVERSION;  Surgeon: Candee Furbish, MD;  Location: [facility] OR;  Service: Cardiovascular;  Laterality: N/A;  . Tonsillectomy    . Dilation and curettage of uterus    . Abdominal hysterectomy    . Tubal ligation    . Fracture surgery    . Ankle fracture surgery Right ?1999  . Cardioversion N/A  05/02/2015    Procedure: CARDIOVERSION;  Surgeon: Jerline Pain, MD;  Location: [facility] ENDOSCOPY;  Service: Cardiovascular;  Laterality: N/A;    Current Outpatient Prescriptions  Medication Sig Dispense Refill  . acetaminophen (TYLENOL) 500 MG tablet Take 500-1,000 mg by mouth every 6 (six) hours as needed. For pain    . albuterol (PROVENTIL HFA;VENTOLIN HFA) 108 (90 BASE) MCG/ACT inhaler Inhale 2 puffs into the lungs every 4 (four) hours as needed. Shortness of breath    . dofetilide (TIKOSYN) 500 MCG capsule Take 1 capsule (500 mcg total) by mouth 2 (two) times daily. 180 capsule 3  . levothyroxine (SYNTHROID, LEVOTHROID) 125 MCG tablet Take 125 mcg by mouth daily.    . metoprolol succinate (TOPROL-XL) 50 MG 24 hr tablet Take 1 mg by mouth 2 (two) times daily.     . Pediatric Multivit-Minerals-C (RA GUMMY VITAMINS & MINERALS PO) Take 1 tablet by mouth daily.    . Probiotic Product (PROBIOTIC PO) Take 1 tablet by mouth daily.    . rivaroxaban (XARELTO) 20 MG TABS tablet Take 1 tablet (20 mg total) by mouth daily. 30 tablet 11  . tolterodine (DETROL) 2 MG tablet Take 2 mg by mouth daily.    . triazolam (HALCION) 0.25 MG tablet Take 1 tablet by mouth as needed. Take before Dental appts.  0  . valACYclovir (VALTREX) 1000 MG tablet Take 1,000 mg by mouth 2 (two) times daily as needed. Cold sore  No current facility-administered medications for this encounter.    Allergies  Allergen Reactions  . Lisinopril Swelling  . Penicillins Other (See Comments)    whelps  . Diltiazem Rash    Rash on arms and face even when rechallenged    Social History   Social History  . Marital Status: Married    Spouse Name: N/A  . Number of Children: N/A  . Years of Education: N/A   Occupational History  . Not on file.   Social History Main Topics  . Smoking status: Former Smoker -- 1.00 packs/day for 3 years    Types: Cigarettes  . Smokeless tobacco: Never Used     Comment: "quit smoking ~ 1976"  .  Alcohol Use: 2.4 oz/week    4 Cans of beer per week  . Drug Use: No  . Sexual Activity: Yes   Other Topics Concern  . Not on file   Social History Narrative    Family History  Problem Relation Age of Onset  . COPD Father   . Heart disease Father   . Cancer Father     skin  . Breast cancer Mother   . Cancer Mother     skin  . Hypertension Mother     ROS- All systems are reviewed and negative except as per the HPI above  Physical Exam: Filed Vitals:   05/14/15 1527  BP: 142/80  Pulse: 76  Height: 5\' 4"  (1.626 m)  Weight: 227 lb (102.967 kg)    GEN- The patient is well appearing, alert and oriented x 3 today.   Head- normocephalic, atraumatic Eyes-  Sclera clear, conjunctiva pink Ears- hearing intact Oropharynx- clear Neck- supple, no JVP Lymph- no cervical lymphadenopathy Lungs- Clear to ausculation bilaterally, normal work of breathing Heart- Irregular rate and rhythm, no murmurs, rubs or gallops, PMI not laterally displaced GI- soft, NT, ND, + BS Extremities- no clubbing, cyanosis, or edema MS- no significant deformity or atrophy Skin- no rash or lesion Psych- euthymic mood, full affect Neuro- strength and sensation are intact  EKG- Afib at 93 bpm, QRS int 80 ms, QTc 504 ms.(QTc ok pr Dr. Rayann Heman) Epic records reviewed Labs: 8/19 Creatinine 0.84 ms, Kt 5.0, Magnesium 2.1 ms   Assessment and Plan: 1. Longstanding persistent afib Converted initially with tikosyn and cardioversion but unfortunately has returned to afib Coninue BB, metoprolol succinate to 50 mg bid but hold am of procedure due to brady on return to SR with last cardioversion. May only need 1/2 dose that pm following DCCV depending on heart rate. Pt aware not to miss any doses of DOAC  2. HTN Improved with resuming BB   F/u as scheduled in afib clinic in two weeks, sooner if needed.  Geroge Baseman Kahdijah Errickson, [facility] [address] [place], [place]  [address] [phone]

## 2015-05-16 NOTE — Anesthesia Postprocedure Evaluation (Signed)
  Anesthesia Post-op Note  Patient: Carrie Potter  Procedure(s) Performed: Procedure(s): CARDIOVERSION (N/A)  Patient Location: PACU  Anesthesia Type:MAC  Level of Consciousness: awake  Airway and Oxygen Therapy: Patient Spontanous Breathing  Post-op Pain: none  Post-op Assessment: Post-op Vital signs reviewed              Post-op Vital Signs: Reviewed and stable  Last Vitals:  Filed Vitals:   05/16/15 1438  BP: 103/43  Pulse: 38  Temp:   Resp: 15    Complications: No apparent anesthesia complications

## 2015-05-16 NOTE — Discharge Instructions (Signed)

## 2015-05-16 NOTE — Anesthesia Postprocedure Evaluation (Signed)
  Anesthesia Post-op Note  Patient: Carrie Potter  Procedure(s) Performed: Procedure(s): CARDIOVERSION (N/A)  Patient Location: PACU  Anesthesia Type:MAC  Level of Consciousness: awake  Airway and Oxygen Therapy: Patient Spontanous Breathing  Post-op Pain: none  Post-op Assessment: Post-op Vital signs reviewed              Post-op Vital Signs: Reviewed and stable  Last Vitals:  Filed Vitals:   05/16/15 1345  BP: 153/96  Pulse: 93  Temp: 36.8 C  Resp: 22    Complications: No apparent anesthesia complications

## 2015-05-17 ENCOUNTER — Encounter (HOSPITAL_COMMUNITY): Payer: Self-pay | Admitting: Cardiology

## 2015-05-21 ENCOUNTER — Encounter (HOSPITAL_COMMUNITY): Payer: Self-pay | Admitting: Nurse Practitioner

## 2015-05-21 ENCOUNTER — Ambulatory Visit (HOSPITAL_COMMUNITY)
Admission: RE | Admit: 2015-05-21 | Discharge: 2015-05-21 | Disposition: A | Discharge status: Home / Self Care | Payer: BC Managed Care – PPO | Source: Ambulatory Visit | Attending: Nurse Practitioner | Admitting: Nurse Practitioner

## 2015-05-21 VITALS — HR 89 | Ht 64.0 in | Wt 230.2 lb

## 2015-05-21 DIAGNOSIS — I4819 Other persistent atrial fibrillation: Secondary | ICD-10-CM

## 2015-05-21 DIAGNOSIS — I481 Persistent atrial fibrillation: Secondary | ICD-10-CM | POA: Diagnosis present

## 2015-05-21 DIAGNOSIS — Z87891 Personal history of nicotine dependence: Secondary | ICD-10-CM | POA: Insufficient documentation

## 2015-05-21 DIAGNOSIS — R001 Bradycardia, unspecified: Secondary | ICD-10-CM | POA: Insufficient documentation

## 2015-05-21 DIAGNOSIS — I10 Essential (primary) hypertension: Secondary | ICD-10-CM | POA: Insufficient documentation

## 2015-05-21 NOTE — Progress Notes (Addendum)
Patient ID: Carrie Potter, female   DOB: [birth year], 64 y.o.   MRN: [id number]      Primary Care Physician: Antony Blackbird, MD Referring Physician: Dr. Rayann Heman Cardiologist:Dr. Seraphim Affinito is a 64 y.o. female with a h/o lonstanding persistent afib that was hospitalized 8/16 thru 8/18 for tikosyn load and required cardioversion with subsequent Sinus brady in the 40's and BB was held on discharge.QTc remained stable. Renal function, mag and kt all was satisfactory.   She returned after discharge from hospital after calling office reporting increase of heart rate and BP. Reports that Saturday pm she went back into afib. She was  told   to restart BB 25 mg bid. She also reported that she had fever and diarrhea Saturday thru Sunday, but  this is resolved. Has continued xarelto without interruption.  She returns today in rate controlled afib. When I discussed her with Dr. Rayann Heman last week, the plan was to load on tikosyn for another week and then to try DCCV again. She was set up for repeat cardioversion. No missed doses of NOAC.  She had repeat cardioversion 9/1 with 7 second pause on cardioversion with SR in the upper 30's/40's. She was not symptomatic with bradycardia. She discharged home with understanding to hold or reduce BB for HR less less 50. She went back into afib during the night Thursday pm, but returned to [facility] for about 3 hours on Saturday. Ekg  Today shows rate controlled afib.  She is allergic to ace/arbs due to angioedema and has issues with in SR with low v rates and having to hold bb, her BP subsequently goes higher. I did give her a rx for amlodipine if needed over the weekend but did caution her that she may break out in rash since this happened with Cardizem. When I initially discussed with Dr. Rayann Heman trying to restore her to SR, he stated that he would like her to fail Tikosyn and amiodarone before he considered ablation, since left atrium is 49 mm. Pt does feel better in  SR and would like to pursue ablation.  Today, she denies symptoms of palpitations, chest pain, shortness of breath, orthopnea, PND, lower extremity edema, dizziness, presyncope, syncope, or neurologic sequela. The patient is tolerating medications without difficulties and is otherwise without complaint today.   Past Medical History  Diagnosis Date  . HTN (hypertension)   . Asthma   . Hyperlipidemia   . Hypothyroidism   . Obesity   . Palpitation   . Atrial fibrillation   . Walking pneumonia ~ 10/2014  . Dysrhythmia     atrial fibrillation   Past Surgical History  Procedure Laterality Date  . Cardioversion  03/08/2012    Procedure: CARDIOVERSION;  Surgeon: Candee Furbish, MD;  Location: [facility] OR;  Service: Cardiovascular;  Laterality: N/A;  . Tonsillectomy    . Dilation and curettage of uterus    . Abdominal hysterectomy    . Tubal ligation    . Fracture surgery    . Ankle fracture surgery Right ?1999  . Cardioversion N/A 05/02/2015    Procedure: CARDIOVERSION;  Surgeon: Jerline Pain, MD;  Location: [facility];  Service: Cardiovascular;  Laterality: N/A;  . Cardioversion N/A 05/16/2015    Procedure: CARDIOVERSION;  Surgeon: Sueanne Margarita, MD;  Location: [facility];  Service: Cardiovascular;  Laterality: N/A;    Current Outpatient Prescriptions  Medication Sig Dispense Refill  . acetaminophen (TYLENOL) 500 MG tablet Take 500-1,000 mg by mouth every  6 (six) hours as needed for mild pain or moderate pain.     Marland Kitchen albuterol (PROVENTIL HFA;VENTOLIN HFA) 108 (90 BASE) MCG/ACT inhaler Inhale 2 puffs into the lungs every 4 (four) hours as needed for shortness of breath.     . dofetilide (TIKOSYN) 500 MCG capsule Take 1 capsule (500 mcg total) by mouth 2 (two) times daily. 180 capsule 3  . levothyroxine (SYNTHROID, LEVOTHROID) 125 MCG tablet Take 125 mcg by mouth daily.    . metoprolol succinate (TOPROL XL) 25 MG 24 hr tablet Take 1 tablet (25 mg total) by mouth daily. (Patient taking  differently: Take 25 mg by mouth 2 (two) times daily. )    . Pediatric Multivit-Minerals-C (RA GUMMY VITAMINS & MINERALS PO) Take 1 tablet by mouth daily.    . Probiotic Product (PROBIOTIC PO) Take 1 tablet by mouth daily.    . rivaroxaban (XARELTO) 20 MG TABS tablet Take 1 tablet (20 mg total) by mouth daily. 30 tablet 11  . tolterodine (DETROL) 2 MG tablet Take 2 mg by mouth daily.    . triazolam (HALCION) 0.25 MG tablet Take 0.25 mg by mouth as needed for sleep. Take before Dental appts.  0  . valACYclovir (VALTREX) 1000 MG tablet Take 1,000 mg by mouth 2 (two) times daily as needed. Cold sore    . amLODipine (NORVASC) 2.5 MG tablet Take 1 tablet (2.5 mg total) by mouth daily. 30 tablet 1   No current facility-administered medications for this encounter.    Allergies  Allergen Reactions  . Lisinopril Swelling  . Penicillins Other (See Comments)    whelps  . Diltiazem Rash    Rash on arms and face even when rechallenged    Social History   Social History  . Marital Status: Married    Spouse Name: N/A  . Number of Children: N/A  . Years of Education: N/A   Occupational History  . Not on file.   Social History Main Topics  . Smoking status: Former Smoker -- 1.00 packs/day for 3 years    Types: Cigarettes  . Smokeless tobacco: Never Used     Comment: "quit smoking ~ 1976"  . Alcohol Use: 2.4 oz/week    4 Cans of beer per week  . Drug Use: No  . Sexual Activity: Yes   Other Topics Concern  . Not on file   Social History Narrative    Family History  Problem Relation Age of Onset  . COPD Father   . Heart disease Father   . Cancer Father     skin  . Breast cancer Mother   . Cancer Mother     skin  . Hypertension Mother     ROS- All systems are reviewed and negative except as per the HPI above  Physical Exam: Filed Vitals:   05/21/15 0929  Pulse: 89  Height: 5\' 4"  (1.626 m)  Weight: 230 lb 3.2 oz (104.418 kg)    GEN- The patient is well appearing, alert  and oriented x 3 today.   Head- normocephalic, atraumatic Eyes-  Sclera clear, conjunctiva pink Ears- hearing intact Oropharynx- clear Neck- supple, no JVP Lymph- no cervical lymphadenopathy Lungs- Clear to ausculation bilaterally, normal work of breathing Heart- Irregular rate and rhythm, no murmurs, rubs or gallops, PMI not laterally displaced GI- soft, NT, ND, + BS Extremities- no clubbing, cyanosis, or edema MS- no significant deformity or atrophy Skin- no rash or lesion Psych- euthymic mood, full affect Neuro- strength and sensation  are intact  EKG- Afib at 89 bpm, QRS int 84 ms, QTc 518 ms. Discussed QT with Dr. Rayann Heman who feels it is inaccurate due to afib. While is [place], it was at 430 ms. Epic records reviewed Labs: 8/19 Creatinine 0.84 ms, Kt 5.0, Magnesium 2.1 ms 8/30-Mag 2.2, Kt  4.5    Assessment and Plan: 1. Longstanding symptomatic persistent afib Converted initially with tikosyn and cardioversion but unfortunately ERAF, again had ERAF after second DCCV, with 7 second pause after cardioversion and sinus brady at 38-48. In afib with v rates controlled today. Discussed with Dr. Rayann Heman to pursue ablation. Pt would like to stay on tikosyn if it could encourage SR with ablation.  Dr. Rayann Heman thinks it is reasonable to continue Tikosyn if a timely follow up can be arranged. Appointment made  with Dr. Rayann Heman 9/16 to further discuss ablation. Pt aware not to miss any doses of DOAC. Pt does feel better in SR, so she feels she should at least try tp pursue ablation.  2. HTN Improved with resuming BB Allergic to aces and arbs which limits options in bp management, but may contribute to sinus brady when in sinus rhythm.    3. Bradycardia Has slow v response when in sinus rhythm with termination pauses.   Geroge Baseman Prentiss Hammett, [facility] [address] [place], [place] [address] [phone]

## 2015-05-22 NOTE — Addendum Note (Signed)
Encounter addended by: Sherran Needs, NP on: 05/22/2015 11:28 AM<BR>     Documentation filed: Notes Section

## 2015-05-25 ENCOUNTER — Other Ambulatory Visit (HOSPITAL_COMMUNITY): Payer: Self-pay | Admitting: Nurse Practitioner

## 2015-05-29 ENCOUNTER — Emergency Department (HOSPITAL_COMMUNITY): Payer: BC Managed Care – PPO

## 2015-05-29 ENCOUNTER — Emergency Department (HOSPITAL_COMMUNITY)
Admission: EM | Admit: 2015-05-29 | Discharge: 2015-05-29 | Disposition: A | Discharge status: Home / Self Care | Payer: BC Managed Care – PPO | Attending: Emergency Medicine | Admitting: Emergency Medicine

## 2015-05-29 ENCOUNTER — Encounter (HOSPITAL_COMMUNITY): Payer: Self-pay | Admitting: Emergency Medicine

## 2015-05-29 ENCOUNTER — Telehealth (HOSPITAL_COMMUNITY): Payer: Self-pay | Admitting: *Deleted

## 2015-05-29 DIAGNOSIS — Z87891 Personal history of nicotine dependence: Secondary | ICD-10-CM | POA: Insufficient documentation

## 2015-05-29 DIAGNOSIS — I480 Paroxysmal atrial fibrillation: Secondary | ICD-10-CM

## 2015-05-29 DIAGNOSIS — R001 Bradycardia, unspecified: Secondary | ICD-10-CM | POA: Insufficient documentation

## 2015-05-29 DIAGNOSIS — E039 Hypothyroidism, unspecified: Secondary | ICD-10-CM | POA: Diagnosis not present

## 2015-05-29 DIAGNOSIS — Z7901 Long term (current) use of anticoagulants: Secondary | ICD-10-CM | POA: Insufficient documentation

## 2015-05-29 DIAGNOSIS — I10 Essential (primary) hypertension: Secondary | ICD-10-CM | POA: Diagnosis not present

## 2015-05-29 DIAGNOSIS — Z79899 Other long term (current) drug therapy: Secondary | ICD-10-CM | POA: Diagnosis not present

## 2015-05-29 DIAGNOSIS — J45909 Unspecified asthma, uncomplicated: Secondary | ICD-10-CM | POA: Insufficient documentation

## 2015-05-29 DIAGNOSIS — Z880 Allergy status to penicillin: Secondary | ICD-10-CM | POA: Insufficient documentation

## 2015-05-29 DIAGNOSIS — Z8701 Personal history of pneumonia (recurrent): Secondary | ICD-10-CM | POA: Insufficient documentation

## 2015-05-29 DIAGNOSIS — E669 Obesity, unspecified: Secondary | ICD-10-CM | POA: Diagnosis not present

## 2015-05-29 DIAGNOSIS — R42 Dizziness and giddiness: Secondary | ICD-10-CM | POA: Diagnosis not present

## 2015-05-29 LAB — BASIC METABOLIC PANEL
ANION GAP: 9 (ref 5–15)
BUN: 18 mg/dL (ref 6–20)
CO2: 24 mmol/L (ref 22–32)
Calcium: 9.1 mg/dL (ref 8.9–10.3)
Chloride: 103 mmol/L (ref 101–111)
Creatinine, Ser: 0.82 mg/dL (ref 0.44–1.00)
GFR calc Af Amer: >60 mL/min (ref >60)
GFR calc non Af Amer: >60 mL/min (ref >60)
GLUCOSE: 104 mg/dL — AB (ref 65–99)
POTASSIUM: 4.3 mmol/L (ref 3.5–5.1)
Sodium: 136 mmol/L (ref 135–145)

## 2015-05-29 LAB — CBC
HEMATOCRIT: 41.2 % (ref 36.0–46.0)
HEMOGLOBIN: 13.8 g/dL (ref 12.0–15.0)
MCH: 35.6 pg — AB (ref 26.0–34.0)
MCHC: 33.5 g/dL (ref 30.0–36.0)
MCV: 106.2 fL — ABNORMAL HIGH (ref 78.0–100.0)
Platelets: 152 10*3/uL (ref 150–400)
RBC: 3.88 MIL/uL (ref 3.87–5.11)
RDW: 13 % (ref 11.5–15.5)
WBC: 10.7 10*3/uL — ABNORMAL HIGH (ref 4.0–10.5)

## 2015-05-29 LAB — TROPONIN I: Troponin I: <0.03 ng/mL (ref <0.031)

## 2015-05-29 NOTE — ED Provider Notes (Signed)
CSN: [id number]     Arrival date & time 05/29/15  1407 History   First MD Initiated Contact with Patient 05/29/15 1411     Chief Complaint  Patient presents with  . Bradycardia  . Atrial Fibrillation     (Consider location/radiation/quality/duration/timing/severity/associated sxs/prior Treatment) HPI Comments: Pt comes in with c/o dizziness and not feeling right that occurred today. Pt states that she had a history of afib and she has been cardioverted 2 times in the last month. She states that she is scheduled to discuss ablation in the next week. She denies cp or sob. She states that she has been tired.  The history is provided by the patient. No language interpreter was used.    Past Medical History  Diagnosis Date  . HTN (hypertension)   . Asthma   . Hyperlipidemia   . Hypothyroidism   . Obesity   . Palpitation   . Atrial fibrillation   . Walking pneumonia ~ 10/2014  . Dysrhythmia     atrial fibrillation   Past Surgical History  Procedure Laterality Date  . Cardioversion  03/08/2012    Procedure: CARDIOVERSION;  Surgeon: Candee Furbish, MD;  Location: [facility] OR;  Service: Cardiovascular;  Laterality: N/A;  . Tonsillectomy    . Dilation and curettage of uterus    . Abdominal hysterectomy    . Tubal ligation    . Fracture surgery    . Ankle fracture surgery Right ?1999  . Cardioversion N/A 05/02/2015    Procedure: CARDIOVERSION;  Surgeon: Jerline Pain, MD;  Location: [facility];  Service: Cardiovascular;  Laterality: N/A;  . Cardioversion N/A 05/16/2015    Procedure: CARDIOVERSION;  Surgeon: Sueanne Margarita, MD;  Location: [facility];  Service: Cardiovascular;  Laterality: N/A;   Family History  Problem Relation Age of Onset  . COPD Father   . Heart disease Father   . Cancer Father     skin  . Breast cancer Mother   . Cancer Mother     skin  . Hypertension Mother    Social History  Substance Use Topics  . Smoking status: Former Smoker -- 1.00 packs/day for 3 years     Types: Cigarettes  . Smokeless tobacco: Never Used     Comment: "quit smoking ~ 1976"  . Alcohol Use: 2.4 oz/week    4 Cans of beer per week   OB History    No data available     Review of Systems  All other systems reviewed and are negative.     Allergies  Lisinopril; Penicillins; and Diltiazem  Home Medications   Prior to Admission medications   Medication Sig Start Date End Date Taking? Authorizing Provider  acetaminophen (TYLENOL) 500 MG tablet Take 500-1,000 mg by mouth every 6 (six) hours as needed for mild pain or moderate pain.     Historical Provider, MD  albuterol (PROVENTIL HFA;VENTOLIN HFA) 108 (90 BASE) MCG/ACT inhaler Inhale 2 puffs into the lungs every 4 (four) hours as needed for shortness of breath.     Historical Provider, MD  amLODipine (NORVASC) 2.5 MG tablet Take 1 tablet (2.5 mg total) by mouth daily. 05/16/15   Sherran Needs, NP  dofetilide (TIKOSYN) 500 MCG capsule Take 1 capsule (500 mcg total) by mouth 2 (two) times daily. 05/03/15   Amber Sena Slate, NP  levothyroxine (SYNTHROID, LEVOTHROID) 125 MCG tablet Take 125 mcg by mouth daily. 10/26/14   Historical Provider, MD  metoprolol succinate (TOPROL XL) 25 MG 24 hr  tablet Take 1 tablet (25 mg total) by mouth daily. Patient taking differently: Take 25 mg by mouth 2 (two) times daily.  05/16/15   Sherran Needs, NP  Pediatric Multivit-Minerals-C (RA GUMMY VITAMINS & MINERALS PO) Take 1 tablet by mouth daily.    Historical Provider, MD  Probiotic Product (PROBIOTIC PO) Take 1 tablet by mouth daily.    Historical Provider, MD  rivaroxaban (XARELTO) 20 MG TABS tablet Take 1 tablet (20 mg total) by mouth daily. 01/01/15   Jerline Pain, MD  tolterodine (DETROL) 2 MG tablet Take 2 mg by mouth daily.    Historical Provider, MD  triazolam (HALCION) 0.25 MG tablet Take 0.25 mg by mouth as needed for sleep. Take before Dental appts. 04/03/15   Historical Provider, MD  valACYclovir (VALTREX) 1000 MG tablet Take 1,000 mg  by mouth 2 (two) times daily as needed. Cold sore    Historical Provider, MD   BP 124/72 mmHg  Pulse 47  Temp(Src) 98.4 F (36.9 C) (Oral)  Resp 19  SpO2 97% Physical Exam  Constitutional: She is oriented to person, place, and time. She appears well-developed and well-nourished.  Cardiovascular: Normal rate and regular rhythm.   Pulmonary/Chest: Effort normal and breath sounds normal.  Abdominal: Soft. Bowel sounds are normal.  Musculoskeletal: Normal range of motion. She exhibits no edema.  Neurological: She is alert and oriented to person, place, and time.  Skin: Skin is warm and dry.  Psychiatric: She has a normal mood and affect.  Nursing note and vitals reviewed.   ED Course  Procedures (including critical care time) Labs Review Labs Reviewed  BASIC METABOLIC PANEL - Abnormal; Notable for the following:    Glucose, Bld 104 (*)    All other components within normal limits  CBC - Abnormal; Notable for the following:    WBC 10.7 (*)    MCV 106.2 (*)    MCH 35.6 (*)    All other components within normal limits  TROPONIN I    Imaging Review Dg Chest 2 View  05/29/2015   CLINICAL DATA:  Bradycardia and dizziness. History of atrial fibrillation  EXAM: CHEST  2 VIEW  COMPARISON:  October 26, 2014  FINDINGS: Previous infiltrate on the left has resolved. Currently lungs are clear. Heart is borderline enlarged with pulmonary vascularity within normal limits. No adenopathy. There is mild degenerative change in the thoracic spine.  IMPRESSION: Lungs clear. Heart borderline enlarged with pulmonary vascularity within normal limits.   Electronically Signed   By: Lowella Grip III M.D.   On: 05/29/2015 14:54   I have personally reviewed and evaluated these images and lab results as part of my medical decision-making.   EKG Interpretation None      MDM   Final diagnoses:  Dizzy  Bradycardia   Pt heart rate is from 45-120 2:38 PM Spoke with cardiology and pt is to see the  pt    Glendell Docker, NP 05/29/15 [place], MD 05/29/15 [phone]

## 2015-05-29 NOTE — ED Provider Notes (Signed)
Dr. Debara Pickett with cardiology came and evaluated the patient. At this time they feel she is cleared to go home. She has close follow-up with A. fib clinic tomorrow and Dr. Rayann Heman on Friday. Husband is a Music therapist and he will keep an eye on her heart rate. They will hold beta blockers if she is bradycardic. Patient and family are comfortable going home at this time.  Blanchie Dessert, MD 05/29/15 1719

## 2015-05-29 NOTE — ED Notes (Signed)
Cardiology at bedside.

## 2015-05-29 NOTE — ED Notes (Signed)
Patient comes from work states she started feeling clammy and dizzy. Patient states, " I did not feel like myself." Patient states she has Hx of Afib and states HR has been high 40's and low 50's patient states HR normally in 70's. Patient has had 3 Cadioversion in the last 4 weeks. Patient is taking [place]. Patient denies any pain, SOB.  Alert and Oriented

## 2015-05-29 NOTE — Discharge Instructions (Signed)
Bradycardia °Bradycardia is a term for a heart rate (pulse) that, in adults, is slower than 60 beats per minute. A normal rate is 60 to 100 beats per minute. A heart rate below 60 beats per minute may be normal for some adults with healthy hearts. If the rate is too slow, the heart may have trouble pumping the volume of blood the body needs. If the heart rate gets too low, blood flow to the brain may be decreased and may make you feel lightheaded, dizzy, or faint. °The heart has a natural pacemaker in the top of the heart called the SA node (sinoatrial or sinus node). This pacemaker sends out regular electrical signals to the muscle of the heart, telling the heart muscle when to beat (contract). The electrical signal travels from the upper parts of the heart (atria) through the AV node (atrioventricular node), to the lower chambers of the heart (ventricles). The ventricles squeeze, pumping the blood from your heart to your lungs and to the rest of your body. °CAUSES  °· Problem with the heart's electrical system. °· Problem with the heart's natural pacemaker. °· Heart disease, damage, or infection. °· Medications. °· Problems with minerals and salts (electrolytes). °SYMPTOMS  °· Fainting (syncope). °· Fatigue and weakness. °· Shortness of breath (dyspnea). °· Chest pain (angina). °· Drowsiness. °· Confusion. °DIAGNOSIS  °· An electrocardiogram (ECG) can help your caregiver determine the type of slow heart rate you have. °· If the cause is not seen on an ECG, you may need to wear a heart monitor that records your heart rhythm for several hours or days. °· Blood tests. °TREATMENT  °· Electrolyte supplements. °· Medications. °· Withholding medication which is causing a slow heart rate. °· Pacemaker placement. °SEEK IMMEDIATE MEDICAL CARE IF:  °· You feel lightheaded or faint. °· You develop an irregular heart rate. °· You feel chest pain or have trouble breathing. °MAKE SURE YOU:  °· Understand these  instructions. °· Will watch your condition. °· Will get help right away if you are not doing well or get worse. °Document Released: 05/23/2002 Document Revised: 11/23/2011 Document Reviewed: 12/06/2013 °Exit[employer]® Patient Information ©2015 [employer]. This information is not intended to replace advice given to you by your health care provider. Make sure you discuss any questions you have with your health care provider. ° °

## 2015-05-29 NOTE — Telephone Encounter (Signed)
Pt called in stating she has been dizzy at work today when she stood up from bending over.  Her BP 150/80 but cannot register heart rate but says its irregular.  Coworkers want to transport her by EMS to ER but patient wanting to be seen here in clinic.  Offered patient appointment but only if she didn't drive to the appointment since she is still experiencing dizziness.  Patient said she will discuss with coworkers and let me know what is decided to do.  Patient did not sound short of breath or in any distress while on the phone.  Addendum; Talked with patient - she is being transported to [facility] ER she is between SB 40-50 then short burst of AFib.  Patient was appreciative of the callback.

## 2015-05-29 NOTE — Consult Note (Signed)
CARDIOLOGY CONSULT NOTE   Patient ID: Carrie Potter MRN: [id number], DOB/AGE: [birth year]   Admit date: 05/29/2015 Date of Consult: 05/29/2015    Primary Physician: Antony Blackbird, MD Primary Cardiologist: Dr. Marlou Porch Dr. Rayann Heman  HPI: Carrie Potter is a 64 y.o. female with past medical history of Atrial Fibrillation, HLD, HTN, Hypothyroidism and Asthma who presents to [facility] ED on 05/29/2015 for dizziness and "not feeling quite right" at work this morning.   She reports having dizziness this morning and had an EKG checked at work which showed sinus bradycardia with rate in 40's- 50's. She contacted the atrial fibrillation clinic and they told her to come in if someone could drive her, otherwise take the ambulance to the ED. She was noted to still be in sinus bradycardia upon arrival of EMS but has been in atrial fibrillation while in the ED. Her rate has mostly been in the 80's - 90's while in the ED. She has remained asymptomatic while here.   She denies any recent shortness of breath, palpitations, chest pain, syncope, nausea, or vomiting.  Recently hospitalized from 04/30/2015 - 05/02/2015 for Tikosyn load and required cardioversion on 05/02/2015. She was bradycardiac following the cardioversion and her BB was held at discharge.    She began to feel palpitations starting again on 05/05/2015 and was confirmed to be in atrial fibrillation again at her office visit on 05/07/2015. She was kept on Metoprolol Succinate 50mg  BID, Tikosyn 500mg  BID, and Xarelto 20mg  daily in anticipation of repeat DCCV. Cardioversion was performed on 05/16/2015 with successful return to sinus rhythm but was bradycardiac in the 30's and 40's.   She went back into atrial fibrillation the evening of her procedure. When seen in clinic on 05/21/2015 she was in atrial fibrillation with rate in the 80's. Ablation was discussed with Dr. Rayann Heman at that time and and he wished for her to fail Tikosyn and Amiodarone  before attempting ablation due to left atrium being 61mm. She has an appointment on 05/31/2015 to discuss ablation with Dr. Rayann Heman.   Problem List Past Medical History  Diagnosis Date  . HTN (hypertension)   . Asthma   . Hyperlipidemia   . Hypothyroidism   . Obesity   . Palpitation   . Atrial fibrillation   . Walking pneumonia ~ 10/2014  . Dysrhythmia     atrial fibrillation    Past Surgical History  Procedure Laterality Date  . Cardioversion  03/08/2012    Procedure: CARDIOVERSION;  Surgeon: Candee Furbish, MD;  Location: [facility] OR;  Service: Cardiovascular;  Laterality: N/A;  . Tonsillectomy    . Dilation and curettage of uterus    . Abdominal hysterectomy    . Tubal ligation    . Fracture surgery    . Ankle fracture surgery Right ?1999  . Cardioversion N/A 05/02/2015    Procedure: CARDIOVERSION;  Surgeon: Jerline Pain, MD;  Location: [place];  Service: Cardiovascular;  Laterality: N/A;  . Cardioversion N/A 05/16/2015    Procedure: CARDIOVERSION;  Surgeon: Sueanne Margarita, MD;  Location: [facility] ENDOSCOPY;  Service: Cardiovascular;  Laterality: N/A;     Allergies Allergies  Allergen Reactions  . Lisinopril Swelling  . Penicillins Other (See Comments)    whelps  . Diltiazem Rash    Rash on arms and face even when rechallenged      Inpatient Medications    Family History Family History  Problem Relation Age of Onset  . COPD Father   . Heart disease  Father   . Cancer Father     skin  . Breast cancer Mother   . Cancer Mother     skin  . Hypertension Mother      Social History Social History   Social History  . Marital Status: Married    Spouse Name: N/A  . Number of Children: N/A  . Years of Education: N/A   Occupational History  . Not on file.   Social History Main Topics  . Smoking status: Former Smoker -- 1.00 packs/day for 3 years    Types: Cigarettes  . Smokeless tobacco: Never Used     Comment: "quit smoking ~ 1976"  . Alcohol Use: 2.4 oz/week     4 Cans of beer per week  . Drug Use: No  . Sexual Activity: Yes   Other Topics Concern  . Not on file   Social History Narrative     Review of Systems: General:  No chills, fever, night sweats or weight changes.  Cardiovascular:  No chest pain, dyspnea on exertion, edema, orthopnea, palpitations, paroxysmal nocturnal dyspnea. Dermatological: No rash, lesions/masses Respiratory: No cough, dyspnea Urologic: No hematuria, dysuria Abdominal:   No nausea, vomiting, diarrhea, bright red blood per rectum, melena, or hematemesis Neurologic:  No visual changes, wkns, changes in mental status. Positive for dizziness.  All other systems reviewed and are otherwise negative except as noted above.  Physical Exam Blood pressure 107/92, pulse 70, temperature 98.4 F (36.9 C), temperature source Oral, resp. rate 19, SpO2 97 %.  General: Pleasant, Caucasian female in no acute distress Psych: Normal affect.  Neuro: Alert and oriented X 3. Moves all extremities spontaneously. HEENT: Normal  Neck: Supple without bruits or JVD. Lungs:  Resp regular and unlabored, CTA without wheezing or rales. Heart: Irregularly irregular with rate in 80's - 90's. No s3, s4, or murmurs. Abdomen: Soft, non-tender, non-distended, BS + x 4.  Extremities: No clubbing, cyanosis or edema. DP/PT/Radials 2+ and equal bilaterally.  Labs   Lab Results  Component Value Date   WBC 10.7* 05/29/2015   HGB 13.8 05/29/2015   HCT 41.2 05/29/2015   MCV 106.2* 05/29/2015   PLT 152 05/29/2015    Radiology/Studies Dg Chest 2 View: 05/29/2015   CLINICAL DATA:  Bradycardia and dizziness. History of atrial fibrillation  EXAM: CHEST  2 VIEW  COMPARISON:  October 26, 2014  FINDINGS: Previous infiltrate on the left has resolved. Currently lungs are clear. Heart is borderline enlarged with pulmonary vascularity within normal limits. No adenopathy. There is mild degenerative change in the thoracic spine.  IMPRESSION: Lungs clear. Heart  borderline enlarged with pulmonary vascularity within normal limits.   Electronically Signed   By: Lowella Grip III M.D.   On: 05/29/2015 14:54    ECG: Atrial Fibrillation with rate in 90's.   ASSESSMENT AND PLAN  1. Symptomatic Persistent Atrial Fibrillation - patient presented to the ED after experiencing an episode of dizziness this morning. Was in sinus bradycardia this morning and presented to the ED in atrial fibrillation with rate in the 80's -90's. - has been asymptomatic while in the ED with no recurrent episodes of dizziness - not sure she would benefit from cardioversion at this time since this has already been performed twice with her converting back to atrial fibrillation rather quickly following the procedures. - will keep all of her medications the same, with no medication changes recommended at this time - she has scheduled follow-up with the Roderic Palau in the Atrial Fibrillation  clinic tomorrow and an appointment with Dr. Rayann Heman on Friday to discuss potential ablation.  Signed, Erma Heritage, PA-C 05/29/2015, 3:13 PM Pager: [phone]

## 2015-05-30 ENCOUNTER — Ambulatory Visit (HOSPITAL_COMMUNITY)
Admit: 2015-05-30 | Discharge: 2015-05-30 | Disposition: A | Discharge status: Home / Self Care | Payer: BC Managed Care – PPO | Source: Ambulatory Visit | Attending: Nurse Practitioner | Admitting: Nurse Practitioner

## 2015-05-30 ENCOUNTER — Encounter (HOSPITAL_COMMUNITY): Payer: Self-pay | Admitting: Nurse Practitioner

## 2015-05-30 VITALS — BP 140/90 | HR 78 | Ht 64.0 in | Wt 231.4 lb

## 2015-05-30 DIAGNOSIS — I4819 Other persistent atrial fibrillation: Secondary | ICD-10-CM

## 2015-05-30 DIAGNOSIS — I481 Persistent atrial fibrillation: Secondary | ICD-10-CM | POA: Diagnosis present

## 2015-05-30 DIAGNOSIS — Z87891 Personal history of nicotine dependence: Secondary | ICD-10-CM | POA: Diagnosis not present

## 2015-05-30 DIAGNOSIS — I10 Essential (primary) hypertension: Secondary | ICD-10-CM | POA: Insufficient documentation

## 2015-05-30 NOTE — Progress Notes (Signed)
Patient ID: Carrie Potter, female   DOB: 11-23-1950, 64 y.o.   MRN: [id number]      Primary Care Physician: Antony Blackbird, MD Referring Physician: Dr. Rayann Heman Cardiologist:Dr. Karrigan Messamore is a 64 y.o. female with a h/o lonstanding persistent afib (3 years) that was hospitalized 8/16 thru 8/18 for tikosyn load and required cardioversion with subsequent snus brady in the 40's and BB was held on discharge.QTc remained stable. Renal function, mag and kt all was satisfactory.   She returned last week, after calling office yesterday reporting increase of heart rate and BP. Reports that Saturday pm she went back into afib. She was  told  yesterday to restart BB 25 mg bid. She also reports that she had fever and diarrhea Saturday thru Sunday, but  this is resolved. Has continued xarelto without interruption.  When I discussed her with Dr. Rayann Heman, the plan was to load on tikosyn for another week and then to try DCCV again. She did convert to sinus brady with repeat DCCV, but returned to Afib within a day. She does feel better in SR. She is pending an appointment with DR. Allred to discuss ablation and if Tikosyn needs to be continued or stopped since she is not staying in SR with it.  Yesterday, 9/14/, while she was at work, she suddenly became dizzy and after several minutes of feeling this way, she tried to walk to the nurse section and get her vitals checked.  She became very symptomatic with walking but no syncope. BP was ok but heart rate was in the  30's to 40's and regular. EMS was called and confirmed Sinus brady on monitor. She was transported to [facility], she was monitored and then released. HR went back and forth  between SB to afib. When she was discharged she was in rate controlled afib. She believes she has been in afib the rest of time since d/c.  Today, she denies symptoms of palpitations, chest pain, shortness of breath, orthopnea, PND, lower extremity edema, dizziness, presyncope,  syncope, or neurologic sequela. The patient is tolerating medications without difficulties and is otherwise without complaint today.   Past Medical History  Diagnosis Date  . HTN (hypertension)   . Asthma   . Hyperlipidemia   . Hypothyroidism   . Obesity   . Palpitation   . Atrial fibrillation   . Walking pneumonia ~ 10/2014  . Dysrhythmia     atrial fibrillation   Past Surgical History  Procedure Laterality Date  . Cardioversion  03/08/2012    Procedure: CARDIOVERSION;  Surgeon: Candee Furbish, MD;  Location: [facility] OR;  Service: Cardiovascular;  Laterality: N/A;  . Tonsillectomy    . Dilation and curettage of uterus    . Abdominal hysterectomy    . Tubal ligation    . Fracture surgery    . Ankle fracture surgery Right ?1999  . Cardioversion N/A 05/02/2015    Procedure: CARDIOVERSION;  Surgeon: Jerline Pain, MD;  Location: [place];  Service: Cardiovascular;  Laterality: N/A;  . Cardioversion N/A 05/16/2015    Procedure: CARDIOVERSION;  Surgeon: Sueanne Margarita, MD;  Location: [facility]OSCOPY;  Service: Cardiovascular;  Laterality: N/A;    Current Outpatient Prescriptions  Medication Sig Dispense Refill  . acetaminophen (TYLENOL) 500 MG tablet Take 500-1,000 mg by mouth every 6 (six) hours as needed for mild pain or moderate pain.     Marland Kitchen albuterol (PROVENTIL HFA;VENTOLIN HFA) 108 (90 BASE) MCG/ACT inhaler Inhale 2 puffs into  the lungs every 4 (four) hours as needed for shortness of breath.     . dofetilide (TIKOSYN) 500 MCG capsule Take 1 capsule (500 mcg total) by mouth 2 (two) times daily. 180 capsule 3  . levothyroxine (SYNTHROID, LEVOTHROID) 125 MCG tablet Take 125 mcg by mouth daily.    . metoprolol succinate (TOPROL XL) 25 MG 24 hr tablet Take 1 tablet (25 mg total) by mouth daily. (Patient taking differently: Take 50 mg by mouth 2 (two) times daily. )    . Pediatric Multivit-Minerals-C (RA GUMMY VITAMINS & MINERALS PO) Take 2 tablets by mouth daily.     . Probiotic Product  (PROBIOTIC PO) Take 1 tablet by mouth daily.    . rivaroxaban (XARELTO) 20 MG TABS tablet Take 1 tablet (20 mg total) by mouth daily. 30 tablet 11  . tolterodine (DETROL) 2 MG tablet Take 2 mg by mouth daily.    . triazolam (HALCION) 0.25 MG tablet Take 0.25 mg by mouth as needed for sleep. Take before Dental appts.  0  . valACYclovir (VALTREX) 1000 MG tablet Take 1,000 mg by mouth 2 (two) times daily as needed. Cold sore     No current facility-administered medications for this encounter.    Allergies  Allergen Reactions  . Lisinopril Swelling  . Penicillins Other (See Comments)    whelps  . Diltiazem Rash    Rash on arms and face even when rechallenged    Social History   Social History  . Marital Status: Married    Spouse Name: N/A  . Number of Children: N/A  . Years of Education: N/A   Occupational History  . Not on file.   Social History Main Topics  . Smoking status: Former Smoker -- 1.00 packs/day for 3 years    Types: Cigarettes  . Smokeless tobacco: Never Used     Comment: "quit smoking ~ 1976"  . Alcohol Use: 2.4 oz/week    4 Cans of beer per week  . Drug Use: No  . Sexual Activity: Yes   Other Topics Concern  . Not on file   Social History Narrative    Family History  Problem Relation Age of Onset  . COPD Father   . Heart disease Father   . Cancer Father     skin  . Breast cancer Mother   . Cancer Mother     skin  . Hypertension Mother     ROS- All systems are reviewed and negative except as per the HPI above  Physical Exam: Filed Vitals:   05/30/15 0914 05/30/15 1250  BP:  140/90  Pulse: 78   Height: 5\' 4"  (1.626 m)   Weight: 231 lb 6.4 oz (104.962 kg)     GEN- The patient is well appearing, alert and oriented x 3 today.   Head- normocephalic, atraumatic Eyes-  Sclera clear, conjunctiva pink Ears- hearing intact Oropharynx- clear Neck- supple, no JVP Lymph- no cervical lymphadenopathy Lungs- Clear to ausculation bilaterally,  normal work of breathing Heart- Irregular rate and rhythm, no murmurs, rubs or gallops, PMI not laterally displaced GI- soft, NT, ND, + BS Extremities- no clubbing, cyanosis, or edema MS- no significant deformity or atrophy Skin- no rash or lesion Psych- euthymic mood, full affect Neuro- strength and sensation are intact  EKG- Afib at 78 bpm, QRS int 66 ms, QTc 485 ms  Epic records reviewed ER labs/notes/chest xray reviewed   Assessment and Plan: 1. H/O Longstanding persistent afib Converted initially with tikosyn and  cardioversion x 2,  but unfortunately has returned to afib Has symptomatic  termination pauses/brady when in SR Is scheduled to see Dr. Rayann Heman tomorrow to discuss ablation, need to continue tikosyn. Pt feels better in SR and would like to continue to pursue SR Continue DOAC  2. HTN Improved with resuming BB However, I am sure BB is contributing to [place] when in [place]. Allergic to aces/arbs with angioedema Was on HCTZ prior to tikosyn Rash with CCB's  Will see per direction of Dr. Lawrence Marseilles C. Despina Boan, [facility] [address] [place], [place] [address] [phone]

## 2015-05-31 ENCOUNTER — Ambulatory Visit (INDEPENDENT_AMBULATORY_CARE_PROVIDER_SITE_OTHER): Payer: BC Managed Care – PPO | Admitting: Internal Medicine

## 2015-05-31 ENCOUNTER — Encounter: Payer: Self-pay | Admitting: *Deleted

## 2015-05-31 ENCOUNTER — Encounter: Payer: Self-pay | Admitting: Internal Medicine

## 2015-05-31 VITALS — BP 150/88 | HR 103 | Ht 64.0 in | Wt 234.2 lb

## 2015-05-31 DIAGNOSIS — I481 Persistent atrial fibrillation: Secondary | ICD-10-CM

## 2015-05-31 DIAGNOSIS — I4819 Other persistent atrial fibrillation: Secondary | ICD-10-CM

## 2015-05-31 DIAGNOSIS — E669 Obesity, unspecified: Secondary | ICD-10-CM

## 2015-05-31 DIAGNOSIS — R001 Bradycardia, unspecified: Secondary | ICD-10-CM

## 2015-05-31 DIAGNOSIS — I10 Essential (primary) hypertension: Secondary | ICD-10-CM | POA: Diagnosis not present

## 2015-05-31 MED ORDER — SPIRONOLACTONE 25 MG PO TABS: 12.5000 mg | ORAL_TABLET | Freq: Every day | ORAL | Status: DC

## 2015-05-31 NOTE — Patient Instructions (Signed)
Medication Instructions:  Your physician has recommended you make the following change in your medication:  1) Start Spironolactone 12.5mg  daily    Labwork: Your physician recommends that you return for lab work on 06/19/15   Testing/Procedures: Your physician has requested that you have a TEE. During a TEE, sound waves are used to create images of your heart. It provides your doctor with information about the size and shape of your heart and how well your heart's chambers and valves are working. In this test, a transducer is attached to the end of a flexible tube that's guided down your throat and into your esophagus (the tube leading from you mouth to your stomach) to get a more detailed image of your heart. You are not awake for the procedure. Please see the instruction sheet given to you today. For further information please visit [email or web address].   Your physician has recommended that you have an ablation. Catheter ablation is a medical procedure used to treat some cardiac arrhythmias (irregular heartbeats). During catheter ablation, a long, thin, flexible tube is put into a blood vessel in your groin (upper thigh), or neck. This tube is called an ablation catheter. It is then guided to your heart through the blood vessel. Radio frequency waves destroy small areas of heart tissue where abnormal heartbeats may cause an arrhythmia to start. Please see the instruction sheet given to you today.    Follow-Up: Your physician recommends that you schedule a follow-up appointment in: 4 weeks from 06/27/15 with Roderic Palau, NP and 3 months with Dr Rayann Heman   Any Other Special Instructions Will Be Listed Below (If Applicable).

## 2015-06-01 NOTE — Progress Notes (Signed)
Electrophysiology Office Note   Date:  06/01/2015   ID:  Carrie Potter, DOB 06-Jul-1951, MRN [id number]  PCP:  Antony Blackbird, MD  Cardiologist:  Dr Marlou Porch Primary Electrophysiologist: Thompson Grayer, MD    Chief Complaint  Patient presents with  . Persistent AFIB     History of Present Illness: Carrie Potter is a 64 y.o. female who presents today for electrophysiology evaluation.   She has been followed closely in the AF clinic.  She has a h/o lonstanding persistent afib (3 years).  She was hospitalized 8/16 thru 8/18 for tikosyn load and required cardioversion with subsequent snus brady in the 40's and BB was held on discharge.QTc remained stable.  She has since had additional sinus bradycardia for which she has been seen at [facility] ED.  Unfortunately, she also continues to have afib on tikosyn.  She reports feeling "Much better" in sinus rhythm and would like to continue to pursue rhythm control.  Today, she denies symptoms of chest pain, orthopnea, PND, lower extremity edema, claudication, dizziness, presyncope, syncope, bleeding, or neurologic sequela. The patient is tolerating medications without difficulties and is otherwise without complaint today.    Past Medical History  Diagnosis Date  . HTN (hypertension)   . Asthma   . Hyperlipidemia   . Hypothyroidism   . Obesity   . Palpitation   . Atrial fibrillation   . Walking pneumonia ~ 10/2014  . Dysrhythmia     atrial fibrillation   Past Surgical History  Procedure Laterality Date  . Cardioversion  03/08/2012    Procedure: CARDIOVERSION;  Surgeon: Candee Furbish, MD;  Location: [facility] OR;  Service: Cardiovascular;  Laterality: N/A;  . Tonsillectomy    . Dilation and curettage of uterus    . Abdominal hysterectomy    . Tubal ligation    . Fracture surgery    . Ankle fracture surgery Right ?1999  . Cardioversion N/A 05/02/2015    Procedure: CARDIOVERSION;  Surgeon: Jerline Pain, MD;  Location: [place];  Service:  Cardiovascular;  Laterality: N/A;  . Cardioversion N/A 05/16/2015    Procedure: CARDIOVERSION;  Surgeon: Sueanne Margarita, MD;  Location: [place];  Service: Cardiovascular;  Laterality: N/A;     Current Outpatient Prescriptions  Medication Sig Dispense Refill  . acetaminophen (TYLENOL) 500 MG tablet Take 500-1,000 mg by mouth every 6 (six) hours as needed for mild pain or moderate pain.     Marland Kitchen albuterol (PROVENTIL HFA;VENTOLIN HFA) 108 (90 BASE) MCG/ACT inhaler Inhale 2 puffs into the lungs every 4 (four) hours as needed for shortness of breath.     . dofetilide (TIKOSYN) 500 MCG capsule Take 1 capsule (500 mcg total) by mouth 2 (two) times daily. 180 capsule 3  . levothyroxine (SYNTHROID, LEVOTHROID) 125 MCG tablet Take 125 mcg by mouth daily.    . metoprolol succinate (TOPROL-XL) 50 MG 24 hr tablet Take 50 mg by mouth 2 (two) times daily.     . Multiple Vitamin (MULTIVITAMIN) tablet Take 2 tablets by mouth daily. Women's Gummies    . Probiotic Product (PROBIOTIC PO) Take 1 tablet by mouth daily.    . rivaroxaban (XARELTO) 20 MG TABS tablet Take 1 tablet (20 mg total) by mouth daily. 30 tablet 11  . tolterodine (DETROL) 2 MG tablet Take 2 mg by mouth daily.    . triazolam (HALCION) 0.25 MG tablet Take 0.25 mg by mouth as directed. Take before Dental appts.  0  . valACYclovir (VALTREX) 1000 MG  tablet Take 1,000 mg by mouth 2 (two) times daily as needed. Cold sore    . spironolactone (ALDACTONE) 25 MG tablet Take 0.5 tablets (12.5 mg total) by mouth daily. 45 tablet 3   No current facility-administered medications for this visit.    Allergies:   Lisinopril; Penicillins; and Diltiazem   Social History:  The patient  reports that she has quit smoking. Her smoking use included Cigarettes. She has a 3 pack-year smoking history. She has never used smokeless tobacco. She reports that she drinks about 2.4 oz of alcohol per week. She reports that she does not use illicit drugs.   Family History:   The patient's  family history includes Breast cancer in her mother; COPD in her father; Cancer in her father and mother; Heart disease in her father; Hypertension in her mother.    ROS:  Please see the history of present illness.   All other systems are reviewed and negative.    PHYSICAL EXAM: VS:  BP 150/88 mmHg  Pulse 103  Ht 5\' 4"  (1.626 m)  Wt 234 lb 3.2 oz (106.232 kg)  BMI 40.18 kg/m2 , BMI Body mass index is 40.18 kg/(m^2). GEN: Well nourished, well developed, in no acute distress HEENT: normal Neck: no JVD, carotid bruits, or masses Cardiac: iRRR; no murmurs, rubs, or gallops,no edema  Respiratory:  clear to auscultation bilaterally, normal work of breathing GI: soft, nontender, nondistended, + BS MS: no deformity or atrophy Skin: warm and dry  Neuro:  Strength and sensation are intact Psych: euthymic mood, full affect  EKG:  EKG is ordered today. The ekg ordered today shows afib, stable qt   Recent Labs: 04/17/2015: TSH 0.857 05/14/2015: Magnesium 2.2 05/29/2015: BUN 18; Creatinine, Ser 0.82; Hemoglobin 13.8; Platelets 152; Potassium 4.3; Sodium 136    Lipid Panel  No results found for: CHOL, TRIG, HDL, CHOLHDL, VLDL, LDLCALC, LDLDIRECT   Wt Readings from Last 3 Encounters:  05/31/15 234 lb 3.2 oz (106.232 kg)  05/30/15 231 lb 6.4 oz (104.962 kg)  05/21/15 230 lb 3.2 oz (104.418 kg)      Other studies Reviewed: Additional studies/ records that were reviewed today include: AF clinic notes are reviewed   ASSESSMENT AND PLAN:  1.  Persistent atrial fibrillation The patient has symptomatic persistent afib.  She has failed medical therapy with tikosyn. She has a chads2vasc score of at least 2.  She is on xarelto Therapeutic strategies for afib including medicine and ablation were discussed in detail with the patient today. Risk, benefits, and alternatives to EP study and radiofrequency ablation for afib were also discussed in detail today. These risks include but  are not limited to stroke, bleeding, vascular damage, tamponade, perforation, damage to the esophagus, lungs, and other structures, pulmonary vein stenosis, worsening renal function, and death. The patient understands these risk and wishes to proceed.  We will therefore proceed with catheter ablation at the next available time.  2. HTN Stable No change required today  3. Obesity Weight loss advised  4. Sinus bradycardia Caution with rate control   Current medicines are reviewed at length with the patient today.   The patient does not have concerns regarding her medicines.  The following changes were made today:  none  Labs/ tests ordered today include:  Orders Placed This Encounter  Procedures  . Basic metabolic panel  . CBC with Differential  . EKG 12-Lead     Signed, Thompson Grayer, MD    [address] [place] [address] [phone] (office) [phone] (fax)

## 2015-06-10 ENCOUNTER — Telehealth (HOSPITAL_COMMUNITY): Payer: Self-pay | Admitting: Nurse Practitioner

## 2015-06-10 ENCOUNTER — Ambulatory Visit: Payer: BC Managed Care – PPO | Admitting: Internal Medicine

## 2015-06-10 NOTE — Telephone Encounter (Signed)
Has noticed some swelling in her feet and shortness of breath since the weekend. Her husband who is an anesthetist suggested increasing spironolactone from 1/2 tab to full tablet 25 mg daily. She did this x 3 days and swelling is improved but not at baseline. Will let her take one more full tablet in am but she will come by in am for  bp/hr check. Has been on spironolactone x one week now.

## 2015-06-11 ENCOUNTER — Ambulatory Visit (HOSPITAL_COMMUNITY)
Admission: RE | Admit: 2015-06-11 | Discharge: 2015-06-11 | Disposition: A | Discharge status: Home / Self Care | Payer: BC Managed Care – PPO | Source: Ambulatory Visit | Attending: Nurse Practitioner | Admitting: Nurse Practitioner

## 2015-06-11 DIAGNOSIS — I480 Paroxysmal atrial fibrillation: Secondary | ICD-10-CM | POA: Diagnosis not present

## 2015-06-11 DIAGNOSIS — I481 Persistent atrial fibrillation: Secondary | ICD-10-CM | POA: Diagnosis present

## 2015-06-11 DIAGNOSIS — I4819 Other persistent atrial fibrillation: Secondary | ICD-10-CM

## 2015-06-11 LAB — BASIC METABOLIC PANEL
ANION GAP: 7 (ref 5–15)
BUN: 18 mg/dL (ref 6–20)
CALCIUM: 9.5 mg/dL (ref 8.9–10.3)
CHLORIDE: 103 mmol/L (ref 101–111)
CO2: 27 mmol/L (ref 22–32)
Creatinine, Ser: 0.7 mg/dL (ref 0.44–1.00)
GFR calc non Af Amer: >60 mL/min (ref >60)
GLUCOSE: 126 mg/dL — AB (ref 65–99)
POTASSIUM: 4.3 mmol/L (ref 3.5–5.1)
Sodium: 137 mmol/L (ref 135–145)

## 2015-06-11 LAB — MAGNESIUM: Magnesium: 2 mg/dL (ref 1.7–2.4)

## 2015-06-11 MED ORDER — METOPROLOL SUCCINATE ER 50 MG PO TB24: 50.0000 mg | ORAL_TABLET | Freq: Two times a day (BID) | ORAL | Status: DC

## 2015-06-11 NOTE — Progress Notes (Signed)
Patient in for bp/hr check as well as labs. BP 130/88 HR 85. No lower extremity edema noted. Bmet and mag checked.

## 2015-06-19 ENCOUNTER — Other Ambulatory Visit: Payer: BC Managed Care – PPO

## 2015-06-26 ENCOUNTER — Other Ambulatory Visit (INDEPENDENT_AMBULATORY_CARE_PROVIDER_SITE_OTHER): Payer: BC Managed Care – PPO

## 2015-06-26 DIAGNOSIS — I4819 Other persistent atrial fibrillation: Secondary | ICD-10-CM

## 2015-06-26 DIAGNOSIS — I481 Persistent atrial fibrillation: Secondary | ICD-10-CM | POA: Diagnosis not present

## 2015-06-26 LAB — CBC WITH DIFFERENTIAL/PLATELET
Basophils Absolute: 0.1 10*3/uL (ref 0.0–0.1)
Basophils Relative: 1 % (ref 0–1)
Eosinophils Absolute: 0.2 10*3/uL (ref 0.0–0.7)
Eosinophils Relative: 3 % (ref 0–5)
HEMATOCRIT: 42.3 % (ref 36.0–46.0)
HEMOGLOBIN: 14.7 g/dL (ref 12.0–15.0)
LYMPHS ABS: 1.9 10*3/uL (ref 0.7–4.0)
LYMPHS PCT: 25 % (ref 12–46)
MCH: 35.6 pg — ABNORMAL HIGH (ref 26.0–34.0)
MCHC: 34.8 g/dL (ref 30.0–36.0)
MCV: 102.4 fL — AB (ref 78.0–100.0)
MONO ABS: 0.9 10*3/uL (ref 0.1–1.0)
MPV: 9.8 fL (ref 8.6–12.4)
Monocytes Relative: 12 % (ref 3–12)
NEUTROS ABS: 4.4 10*3/uL (ref 1.7–7.7)
Neutrophils Relative %: 59 % (ref 43–77)
Platelets: 208 10*3/uL (ref 150–400)
RBC: 4.13 MIL/uL (ref 3.87–5.11)
RDW: 13.4 % (ref 11.5–15.5)
WBC: 7.4 10*3/uL (ref 4.0–10.5)

## 2015-06-26 LAB — BASIC METABOLIC PANEL
BUN: 25 mg/dL (ref 7–25)
CHLORIDE: 100 mmol/L (ref 98–110)
CO2: 26 mmol/L (ref 20–31)
CREATININE: 0.79 mg/dL (ref 0.50–0.99)
Calcium: 9.7 mg/dL (ref 8.6–10.4)
Glucose, Bld: 90 mg/dL (ref 65–99)
POTASSIUM: 4.2 mmol/L (ref 3.5–5.3)
SODIUM: 135 mmol/L (ref 135–146)

## 2015-07-03 ENCOUNTER — Encounter: Payer: Self-pay | Admitting: Internal Medicine

## 2015-07-05 DIAGNOSIS — I480 Paroxysmal atrial fibrillation: Secondary | ICD-10-CM | POA: Insufficient documentation

## 2015-07-05 DIAGNOSIS — J45909 Unspecified asthma, uncomplicated: Secondary | ICD-10-CM | POA: Insufficient documentation

## 2015-07-05 DIAGNOSIS — Z8639 Personal history of other endocrine, nutritional and metabolic disease: Secondary | ICD-10-CM | POA: Insufficient documentation

## 2015-07-05 DIAGNOSIS — E785 Hyperlipidemia, unspecified: Secondary | ICD-10-CM | POA: Insufficient documentation

## 2015-07-09 ENCOUNTER — Telehealth: Payer: Self-pay | Admitting: Internal Medicine

## 2015-07-09 NOTE — Telephone Encounter (Signed)
New message     Talk to the nurse about her upcoming ablation

## 2015-07-09 NOTE — Telephone Encounter (Signed)
Follow up      Pt is calling back to have the nurse call her soon.  She was told her procedure were scheduled on wed and thurs but it was scheduled for thurs and fri.  Pt need to talk to the nurse ASAP regarding this schedule.  Please call

## 2015-07-09 NOTE — Telephone Encounter (Signed)
Spoke with patient and she is aware of the dates for 10/27 TEE and 10/28 ablation.  She had her labs on 10/12

## 2015-07-11 ENCOUNTER — Ambulatory Visit (HOSPITAL_BASED_OUTPATIENT_CLINIC_OR_DEPARTMENT_OTHER): Payer: BC Managed Care – PPO

## 2015-07-11 ENCOUNTER — Encounter (HOSPITAL_COMMUNITY): Payer: Self-pay

## 2015-07-11 ENCOUNTER — Encounter (HOSPITAL_COMMUNITY)
Admission: RE | Disposition: A | Discharge status: Home / Self Care | Payer: Self-pay | Source: Ambulatory Visit | Attending: Cardiovascular Disease

## 2015-07-11 ENCOUNTER — Ambulatory Visit (HOSPITAL_COMMUNITY)
Admission: RE | Admit: 2015-07-11 | Discharge: 2015-07-11 | Disposition: A | Discharge status: Home / Self Care | Payer: BC Managed Care – PPO | Source: Ambulatory Visit | Attending: Cardiovascular Disease | Admitting: Cardiovascular Disease

## 2015-07-11 DIAGNOSIS — Z87891 Personal history of nicotine dependence: Secondary | ICD-10-CM | POA: Insufficient documentation

## 2015-07-11 DIAGNOSIS — R001 Bradycardia, unspecified: Secondary | ICD-10-CM | POA: Insufficient documentation

## 2015-07-11 DIAGNOSIS — E039 Hypothyroidism, unspecified: Secondary | ICD-10-CM | POA: Insufficient documentation

## 2015-07-11 DIAGNOSIS — Z7901 Long term (current) use of anticoagulants: Secondary | ICD-10-CM | POA: Insufficient documentation

## 2015-07-11 DIAGNOSIS — I4891 Unspecified atrial fibrillation: Secondary | ICD-10-CM | POA: Diagnosis not present

## 2015-07-11 DIAGNOSIS — I481 Persistent atrial fibrillation: Secondary | ICD-10-CM | POA: Diagnosis not present

## 2015-07-11 DIAGNOSIS — I081 Rheumatic disorders of both mitral and tricuspid valves: Secondary | ICD-10-CM | POA: Insufficient documentation

## 2015-07-11 DIAGNOSIS — Z79899 Other long term (current) drug therapy: Secondary | ICD-10-CM | POA: Insufficient documentation

## 2015-07-11 DIAGNOSIS — E785 Hyperlipidemia, unspecified: Secondary | ICD-10-CM | POA: Insufficient documentation

## 2015-07-11 DIAGNOSIS — J45909 Unspecified asthma, uncomplicated: Secondary | ICD-10-CM | POA: Insufficient documentation

## 2015-07-11 DIAGNOSIS — E669 Obesity, unspecified: Secondary | ICD-10-CM | POA: Diagnosis not present

## 2015-07-11 DIAGNOSIS — I10 Essential (primary) hypertension: Secondary | ICD-10-CM | POA: Insufficient documentation

## 2015-07-11 DIAGNOSIS — I340 Nonrheumatic mitral (valve) insufficiency: Secondary | ICD-10-CM | POA: Diagnosis not present

## 2015-07-11 DIAGNOSIS — Z6841 Body Mass Index (BMI) 40.0 and over, adult: Secondary | ICD-10-CM | POA: Insufficient documentation

## 2015-07-11 DIAGNOSIS — I4819 Other persistent atrial fibrillation: Secondary | ICD-10-CM

## 2015-07-11 HISTORY — PX: TEE WITHOUT CARDIOVERSION: SHX5443

## 2015-07-11 SURGERY — ECHOCARDIOGRAM, TRANSESOPHAGEAL
Anesthesia: Moderate Sedation

## 2015-07-11 MED ORDER — MIDAZOLAM HCL 5 MG/ML IJ SOLN
INTRAMUSCULAR | Status: AC
Start: 2015-07-11 — End: 2015-07-11
  Filled 2015-07-11: qty 2

## 2015-07-11 MED ORDER — FENTANYL CITRATE (PF) 100 MCG/2ML IJ SOLN
INTRAMUSCULAR | Status: AC
  Filled 2015-07-11: qty 2

## 2015-07-11 MED ORDER — MIDAZOLAM HCL 10 MG/2ML IJ SOLN
INTRAMUSCULAR | Status: DC | PRN
  Administered 2015-07-11 (×2): 2 mg via INTRAVENOUS

## 2015-07-11 MED ORDER — SODIUM CHLORIDE 0.9 % IV SOLN
INTRAVENOUS | Status: DC
  Administered 2015-07-11: 11:00:00 via INTRAVENOUS

## 2015-07-11 MED ORDER — FENTANYL CITRATE (PF) 100 MCG/2ML IJ SOLN
INTRAMUSCULAR | Status: DC | PRN
  Administered 2015-07-11: 25 ug via INTRAVENOUS
  Administered 2015-07-11: 50 ug via INTRAVENOUS

## 2015-07-11 NOTE — CV Procedure (Signed)
TEE Brief Note  LVEF >55% Mild TR, mild MR, trivial PR.  No LA or LA appendage thrombus or mass.  For further details see full report.  Prachi Oftedahl C. Oval Linsey, MD 07/11/2015 11:39 AM.

## 2015-07-11 NOTE — Discharge Instructions (Signed)

## 2015-07-11 NOTE — Progress Notes (Signed)
  Echocardiogram Echocardiogram Transesophageal has been performed.  Carrie Potter M 07/11/2015, 11:53 AM

## 2015-07-11 NOTE — H&P (Signed)
Carrie Potter was seen in clinic on 06/01/15.  There have been no significant changes.  She has not missed any doses of Xarelto and is going for atrial fibrillation ablation tomorrow.    Expand All Collapse All      Electrophysiology Office Note   Date: 06/01/2015   ID: AUDEN WETTSTEIN, DOB 1951/09/06, MRN [id number]  PCP: Antony Blackbird, MD Cardiologist: Dr Marlou Porch Primary Electrophysiologist: Thompson Grayer, MD   Chief Complaint  Patient presents with  . Persistent AFIB    History of Present Illness: Carrie Potter is a 64 y.o. female who presents today for electrophysiology evaluation. She has been followed closely in the AF clinic. She has a h/o lonstanding persistent afib (3 years). She was hospitalized 8/16 thru 8/18 for tikosyn load and required cardioversion with subsequent snus brady in the 40's and BB was held on discharge.QTc remained stable. She has since had additional sinus bradycardia for which she has been seen at [facility] ED. Unfortunately, she also continues to have afib on tikosyn. She reports feeling "Much better" in sinus rhythm and would like to continue to pursue rhythm control.  Today, she denies symptoms of chest pain, orthopnea, PND, lower extremity edema, claudication, dizziness, presyncope, syncope, bleeding, or neurologic sequela. The patient is tolerating medications without difficulties and is otherwise without complaint today.    Past Medical History  Diagnosis Date  . HTN (hypertension)   . Asthma   . Hyperlipidemia   . Hypothyroidism   . Obesity   . Palpitation   . Atrial fibrillation   . Walking pneumonia ~ 10/2014  . Dysrhythmia     atrial fibrillation   Past Surgical History  Procedure Laterality Date  . Cardioversion  03/08/2012    Procedure: CARDIOVERSION; Surgeon: Candee Furbish, MD; Location: [facility] OR; Service: Cardiovascular; Laterality: N/A;  . Tonsillectomy    .  Dilation and curettage of uterus    . Abdominal hysterectomy    . Tubal ligation    . Fracture surgery    . Ankle fracture surgery Right ?1999  . Cardioversion N/A 05/02/2015    Procedure: CARDIOVERSION; Surgeon: Jerline Pain, MD; Location: [place]; Service: Cardiovascular; Laterality: N/A;  . Cardioversion N/A 05/16/2015    Procedure: CARDIOVERSION; Surgeon: Sueanne Margarita, MD; Location: [place] [facility]; Service: Cardiovascular; Laterality: N/A;     Current Outpatient Prescriptions  Medication Sig Dispense Refill  . acetaminophen (TYLENOL) 500 MG tablet Take 500-1,000 mg by mouth every 6 (six) hours as needed for mild pain or moderate pain.     Marland Kitchen albuterol (PROVENTIL HFA;VENTOLIN HFA) 108 (90 BASE) MCG/ACT inhaler Inhale 2 puffs into the lungs every 4 (four) hours as needed for shortness of breath.     . dofetilide (TIKOSYN) 500 MCG capsule Take 1 capsule (500 mcg total) by mouth 2 (two) times daily. 180 capsule 3  . levothyroxine (SYNTHROID, LEVOTHROID) 125 MCG tablet Take 125 mcg by mouth daily.    . metoprolol succinate (TOPROL-XL) 50 MG 24 hr tablet Take 50 mg by mouth 2 (two) times daily.     . Multiple Vitamin (MULTIVITAMIN) tablet Take 2 tablets by mouth daily. Women's Gummies    . Probiotic Product (PROBIOTIC PO) Take 1 tablet by mouth daily.    . rivaroxaban (XARELTO) 20 MG TABS tablet Take 1 tablet (20 mg total) by mouth daily. 30 tablet 11  . tolterodine (DETROL) 2 MG tablet Take 2 mg by mouth daily.    . triazolam (HALCION) 0.25 MG tablet Take 0.25  mg by mouth as directed. Take before Dental appts.  0  . valACYclovir (VALTREX) 1000 MG tablet Take 1,000 mg by mouth 2 (two) times daily as needed. Cold sore    . spironolactone (ALDACTONE) 25 MG tablet Take 0.5 tablets (12.5 mg total) by mouth daily. 45 tablet 3   No current facility-administered medications for this  visit.    Allergies: Lisinopril; Penicillins; and Diltiazem   Social History: The patient  reports that she has quit smoking. Her smoking use included Cigarettes. She has a 3 pack-year smoking history. She has never used smokeless tobacco. She reports that she drinks about 2.4 oz of alcohol per week. She reports that she does not use illicit drugs.   Family History: The patient's family history includes Breast cancer in her mother; COPD in her father; Cancer in her father and mother; Heart disease in her father; Hypertension in her mother.    ROS: Please see the history of present illness. All other systems are reviewed and negative.    PHYSICAL EXAM: VS: BP 150/88 mmHg  Pulse 103  Ht 5\' 4"  (1.626 m)  Wt 234 lb 3.2 oz (106.232 kg)  BMI 40.18 kg/m2 , BMI Body mass index is 40.18 kg/(m^2). GEN: Well nourished, well developed, in no acute distress  HEENT: normal  Neck: no JVD, carotid bruits, or masses Cardiac: iRRR; no murmurs, rubs, or gallops,no edema  Respiratory: clear to auscultation bilaterally, normal work of breathing GI: soft, nontender, nondistended, + BS MS: no deformity or atrophy  Skin: warm and dry  Neuro: Strength and sensation are intact Psych: euthymic mood, full affect  EKG: EKG is ordered today. The ekg ordered today shows afib, stable qt   Recent Labs: 04/17/2015: TSH 0.857 05/14/2015: Magnesium 2.2 05/29/2015: BUN 18; Creatinine, Ser 0.82; Hemoglobin 13.8; Platelets 152; Potassium 4.3; Sodium 136    Lipid Panel   Labs (Brief)    No results found for: CHOL, TRIG, HDL, CHOLHDL, VLDL, LDLCALC, LDLDIRECT     Wt Readings from Last 3 Encounters:  05/31/15 234 lb 3.2 oz (106.232 kg)  05/30/15 231 lb 6.4 oz (104.962 kg)  05/21/15 230 lb 3.2 oz (104.418 kg)      Other studies Reviewed: Additional studies/ records that were reviewed today include: AF clinic notes are reviewed   ASSESSMENT AND PLAN:  1. Persistent atrial  fibrillation The patient has symptomatic persistent afib. She has failed medical therapy with tikosyn. She has a chads2vasc score of at least 2. She is on xarelto Therapeutic strategies for afib including medicine and ablation were discussed in detail with the patient today. Risk, benefits, and alternatives to EP study and radiofrequency ablation for afib were also discussed in detail today. These risks include but are not limited to stroke, bleeding, vascular damage, tamponade, perforation, damage to the esophagus, lungs, and other structures, pulmonary vein stenosis, worsening renal function, and death. The patient understands these risk and wishes to proceed. We will therefore proceed with catheter ablation at the next available time.  2. HTN Stable No change required today  3. Obesity Weight loss advised  4. Sinus bradycardia Caution with rate control   Current medicines are reviewed at length with the patient today.  The patient does not have concerns regarding her medicines. The following changes were made today: none  Labs/ tests ordered today include:  Orders Placed This Encounter  Procedures  . Basic metabolic panel  . CBC with Differential  . EKG 12-Lead     Signed, Thompson Grayer,  MD    [address] [address] [phone] (office) [phone] (fax)          The risks and benefits of TEE have been discussed, including sore throat, bleeding, and esophageal perforation.  She expressed understanding and consents to transesophageal echo.  Ader Fritze C. Oval Linsey, MD 07/11/2015 11:10 AM.

## 2015-07-12 ENCOUNTER — Encounter (HOSPITAL_COMMUNITY): Payer: Self-pay | Admitting: Certified Registered Nurse Anesthetist

## 2015-07-12 ENCOUNTER — Ambulatory Visit (HOSPITAL_COMMUNITY): Payer: BC Managed Care – PPO | Admitting: Certified Registered Nurse Anesthetist

## 2015-07-12 ENCOUNTER — Ambulatory Visit (HOSPITAL_COMMUNITY)
Admission: RE | Admit: 2015-07-12 | Discharge: 2015-07-13 | Disposition: A | Discharge status: Home / Self Care | Payer: BC Managed Care – PPO | Source: Ambulatory Visit | Attending: Internal Medicine | Admitting: Internal Medicine

## 2015-07-12 ENCOUNTER — Encounter (HOSPITAL_COMMUNITY)
Admission: RE | Disposition: A | Discharge status: Home / Self Care | Payer: Self-pay | Source: Ambulatory Visit | Attending: Internal Medicine

## 2015-07-12 DIAGNOSIS — Z7901 Long term (current) use of anticoagulants: Secondary | ICD-10-CM | POA: Insufficient documentation

## 2015-07-12 DIAGNOSIS — E785 Hyperlipidemia, unspecified: Secondary | ICD-10-CM | POA: Insufficient documentation

## 2015-07-12 DIAGNOSIS — Z87891 Personal history of nicotine dependence: Secondary | ICD-10-CM | POA: Insufficient documentation

## 2015-07-12 DIAGNOSIS — E039 Hypothyroidism, unspecified: Secondary | ICD-10-CM | POA: Insufficient documentation

## 2015-07-12 DIAGNOSIS — I4891 Unspecified atrial fibrillation: Secondary | ICD-10-CM | POA: Diagnosis present

## 2015-07-12 DIAGNOSIS — Z79899 Other long term (current) drug therapy: Secondary | ICD-10-CM | POA: Insufficient documentation

## 2015-07-12 DIAGNOSIS — R001 Bradycardia, unspecified: Secondary | ICD-10-CM | POA: Diagnosis present

## 2015-07-12 DIAGNOSIS — I495 Sick sinus syndrome: Secondary | ICD-10-CM | POA: Insufficient documentation

## 2015-07-12 DIAGNOSIS — I10 Essential (primary) hypertension: Secondary | ICD-10-CM | POA: Insufficient documentation

## 2015-07-12 DIAGNOSIS — I481 Persistent atrial fibrillation: Secondary | ICD-10-CM | POA: Insufficient documentation

## 2015-07-12 HISTORY — PX: ELECTROPHYSIOLOGIC STUDY: SHX172A

## 2015-07-12 LAB — BASIC METABOLIC PANEL
ANION GAP: 11 (ref 5–15)
BUN: 19 mg/dL (ref 6–20)
CALCIUM: 9.9 mg/dL (ref 8.9–10.3)
CO2: 28 mmol/L (ref 22–32)
Chloride: 101 mmol/L (ref 101–111)
Creatinine, Ser: 0.75 mg/dL (ref 0.44–1.00)
GFR calc Af Amer: >60 mL/min (ref >60)
GLUCOSE: 114 mg/dL — AB (ref 65–99)
Potassium: 4.5 mmol/L (ref 3.5–5.1)
Sodium: 140 mmol/L (ref 135–145)

## 2015-07-12 LAB — CBC
HCT: 41.5 % (ref 36.0–46.0)
Hemoglobin: 13.4 g/dL (ref 12.0–15.0)
MCH: 34.5 pg — ABNORMAL HIGH (ref 26.0–34.0)
MCHC: 32.3 g/dL (ref 30.0–36.0)
MCV: 107 fL — AB (ref 78.0–100.0)
PLATELETS: 170 10*3/uL (ref 150–400)
RBC: 3.88 MIL/uL (ref 3.87–5.11)
RDW: 13.1 % (ref 11.5–15.5)
WBC: 7.9 10*3/uL (ref 4.0–10.5)

## 2015-07-12 LAB — POCT ACTIVATED CLOTTING TIME
ACTIVATED CLOTTING TIME: 325 s
ACTIVATED CLOTTING TIME: 153 s
Activated Clotting Time: 282 seconds
Activated Clotting Time: 300 seconds

## 2015-07-12 SURGERY — ATRIAL FIBRILLATION ABLATION
Anesthesia: General

## 2015-07-12 MED ORDER — SODIUM CHLORIDE 0.9 % IJ SOLN
3.0000 mL | Freq: Two times a day (BID) | INTRAMUSCULAR | Status: DC
  Administered 2015-07-12 – 2015-07-13 (×3): 3 mL via INTRAVENOUS

## 2015-07-12 MED ORDER — DOFETILIDE 500 MCG PO CAPS
500.0000 ug | ORAL_CAPSULE | Freq: Two times a day (BID) | ORAL | Status: DC
  Administered 2015-07-12 – 2015-07-13 (×2): 500 ug via ORAL
  Filled 2015-07-12 (×4): qty 1

## 2015-07-12 MED ORDER — ALBUMIN HUMAN 5 % IV SOLN
INTRAVENOUS | Status: DC | PRN
  Administered 2015-07-12: 10:00:00 via INTRAVENOUS

## 2015-07-12 MED ORDER — PROTAMINE SULFATE 10 MG/ML IV SOLN
INTRAVENOUS | Status: DC | PRN
  Administered 2015-07-12: 30 mg via INTRAVENOUS

## 2015-07-12 MED ORDER — LIDOCAINE HCL (CARDIAC) 20 MG/ML IV SOLN
INTRAVENOUS | Status: DC | PRN
  Administered 2015-07-12: 80 mg via INTRAVENOUS

## 2015-07-12 MED ORDER — ALBUTEROL SULFATE HFA 108 (90 BASE) MCG/ACT IN AERS: 2.0000 | INHALATION_SPRAY | RESPIRATORY_TRACT | Status: DC | PRN

## 2015-07-12 MED ORDER — ACETAMINOPHEN 325 MG PO TABS
650.0000 mg | ORAL_TABLET | ORAL | Status: DC | PRN
  Administered 2015-07-12: 650 mg via ORAL
  Filled 2015-07-12: qty 2

## 2015-07-12 MED ORDER — RIVAROXABAN 20 MG PO TABS
20.0000 mg | ORAL_TABLET | Freq: Every day | ORAL | Status: DC
  Administered 2015-07-12: 20 mg via ORAL
  Filled 2015-07-12: qty 1

## 2015-07-12 MED ORDER — ALBUTEROL SULFATE (2.5 MG/3ML) 0.083% IN NEBU: 2.5000 mg | INHALATION_SOLUTION | RESPIRATORY_TRACT | Status: DC | PRN

## 2015-07-12 MED ORDER — BUPIVACAINE HCL (PF) 0.25 % IJ SOLN
INTRAMUSCULAR | Status: AC
  Filled 2015-07-12: qty 30

## 2015-07-12 MED ORDER — PHENYLEPHRINE HCL 10 MG/ML IJ SOLN
INTRAMUSCULAR | Status: DC | PRN
  Administered 2015-07-12 (×3): 40 ug via INTRAVENOUS
  Administered 2015-07-12 (×5): 20 ug via INTRAVENOUS

## 2015-07-12 MED ORDER — SPIRONOLACTONE 25 MG PO TABS
12.5000 mg | ORAL_TABLET | Freq: Every day | ORAL | Status: DC
  Administered 2015-07-13: 12.5 mg via ORAL
  Filled 2015-07-12: qty 1

## 2015-07-12 MED ORDER — HEPARIN SODIUM (PORCINE) 1000 UNIT/ML IJ SOLN
INTRAMUSCULAR | Status: AC
  Filled 2015-07-12: qty 1

## 2015-07-12 MED ORDER — SODIUM CHLORIDE 0.9 % IV SOLN
INTRAVENOUS | Status: DC
  Administered 2015-07-12 (×2): via INTRAVENOUS
  Administered 2015-07-12: 1000 mL via INTRAVENOUS

## 2015-07-12 MED ORDER — HYDROCODONE-ACETAMINOPHEN 5-325 MG PO TABS: 1.0000 | ORAL_TABLET | ORAL | Status: DC | PRN

## 2015-07-12 MED ORDER — MIDAZOLAM HCL 5 MG/5ML IJ SOLN
INTRAMUSCULAR | Status: DC | PRN
  Administered 2015-07-12: 2 mg via INTRAVENOUS

## 2015-07-12 MED ORDER — ONDANSETRON HCL 4 MG/2ML IJ SOLN: 4.0000 mg | Freq: Four times a day (QID) | INTRAMUSCULAR | Status: DC | PRN

## 2015-07-12 MED ORDER — IOHEXOL 350 MG/ML SOLN
INTRAVENOUS | Status: DC | PRN
  Administered 2015-07-12: 102 mL via INTRACARDIAC

## 2015-07-12 MED ORDER — HEPARIN SODIUM (PORCINE) 1000 UNIT/ML IJ SOLN
INTRAMUSCULAR | Status: DC | PRN
  Administered 2015-07-12: 12000 [IU] via INTRAVENOUS

## 2015-07-12 MED ORDER — FENTANYL CITRATE (PF) 100 MCG/2ML IJ SOLN
INTRAMUSCULAR | Status: DC | PRN
  Administered 2015-07-12: 50 ug via INTRAVENOUS
  Administered 2015-07-12 (×2): 25 ug via INTRAVENOUS
  Administered 2015-07-12: 50 ug via INTRAVENOUS

## 2015-07-12 MED ORDER — EPHEDRINE SULFATE 50 MG/ML IJ SOLN
INTRAMUSCULAR | Status: DC | PRN
  Administered 2015-07-12 (×3): 10 mg via INTRAVENOUS

## 2015-07-12 MED ORDER — GLYCOPYRROLATE 0.2 MG/ML IJ SOLN
INTRAMUSCULAR | Status: DC | PRN
  Administered 2015-07-12 (×2): 0.2 mg via INTRAVENOUS

## 2015-07-12 MED ORDER — DEXAMETHASONE SODIUM PHOSPHATE 4 MG/ML IJ SOLN
INTRAMUSCULAR | Status: DC | PRN
  Administered 2015-07-12: 4 mg via INTRAVENOUS

## 2015-07-12 MED ORDER — SODIUM CHLORIDE 0.9 % IJ SOLN: 3.0000 mL | INTRAMUSCULAR | Status: DC | PRN

## 2015-07-12 MED ORDER — SUCCINYLCHOLINE CHLORIDE 20 MG/ML IJ SOLN
INTRAMUSCULAR | Status: DC | PRN
  Administered 2015-07-12: 100 mg via INTRAVENOUS

## 2015-07-12 MED ORDER — PROPOFOL 10 MG/ML IV BOLUS
INTRAVENOUS | Status: DC | PRN
  Administered 2015-07-12: 150 mg via INTRAVENOUS

## 2015-07-12 MED ORDER — BUPIVACAINE HCL (PF) 0.25 % IJ SOLN
INTRAMUSCULAR | Status: DC | PRN
  Administered 2015-07-12: 18 mL

## 2015-07-12 MED ORDER — SODIUM CHLORIDE 0.9 % IV SOLN: 250.0000 mL | INTRAVENOUS | Status: DC | PRN

## 2015-07-12 MED ORDER — ONDANSETRON HCL 4 MG/2ML IJ SOLN
INTRAMUSCULAR | Status: DC | PRN
  Administered 2015-07-12 (×2): 4 mg via INTRAVENOUS

## 2015-07-12 MED ORDER — HEPARIN SODIUM (PORCINE) 1000 UNIT/ML IJ SOLN
INTRAMUSCULAR | Status: DC | PRN
  Administered 2015-07-12: 2000 [IU] via INTRAVENOUS

## 2015-07-12 MED ORDER — LEVOTHYROXINE SODIUM 125 MCG PO TABS
125.0000 ug | ORAL_TABLET | Freq: Every day | ORAL | Status: DC
  Administered 2015-07-13: 125 ug via ORAL
  Filled 2015-07-12: qty 1

## 2015-07-12 SURGICAL SUPPLY — 21 items
BAG SNAP BAND KOVER 36X36 (MISCELLANEOUS) ×3 IMPLANT
BLANKET WARM UNDERBOD FULL ACC (MISCELLANEOUS) ×3 IMPLANT
CATH DIAG 6FR PIGTAIL (CATHETERS) ×3 IMPLANT
CATH NAVISTAR SMARTTOUCH DF (ABLATOR) ×3 IMPLANT
CATH SOUNDSTAR ECO REPROCESSED (CATHETERS) ×3 IMPLANT
CATH VARIABLE LASSO NAV 2515 (CATHETERS) ×3 IMPLANT
CATH WEBSTER BI DIR CS D-F CRV (CATHETERS) ×3 IMPLANT
COVER SWIFTLINK CONNECTOR (BAG) ×3 IMPLANT
NEEDLE TRANSEP BRK 71CM 407200 (NEEDLE) ×3 IMPLANT
PACK EP LATEX FREE (CUSTOM PROCEDURE TRAY) ×2
PACK EP LF (CUSTOM PROCEDURE TRAY) ×1 IMPLANT
PAD DEFIB LIFELINK (PAD) ×3 IMPLANT
PATCH CARTO3 (PAD) ×3 IMPLANT
SHEATH AVANTI 11F 11CM (SHEATH) ×3 IMPLANT
SHEATH PINNACLE 7F 10CM (SHEATH) ×6 IMPLANT
SHEATH PINNACLE 9F 10CM (SHEATH) ×3 IMPLANT
SHEATH SWARTZ TS SL2 63CM 8.5F (SHEATH) ×3 IMPLANT
SHIELD RADPAD SCOOP 12X17 (MISCELLANEOUS) ×3 IMPLANT
SYR MEDRAD MARK V 150ML (SYRINGE) ×3 IMPLANT
TUBING CONTRAST HIGH PRESS 48 (TUBING) ×3 IMPLANT
TUBING SMART ABLATE COOLFLOW (TUBING) ×3 IMPLANT

## 2015-07-12 NOTE — Progress Notes (Addendum)
ACT was 153. Order for sheath removal verified per post procedural orders. Procedure explained to patient and Rt femoral vein access site assessed: level 0, palpable dorsalis pedis. 7, 9, 11 French Sheath removed and manual pressure applied for 10 minutes. Pre, peri, & post procedural vitals: HR 60, RR 15, O2 Sat upper 95, BP 134/61, Pain 3 lower back pain from laying flat. Distal pulses remained intact after sheath removal. Access site level 0 and dressed with 4X4 gauze and tegaderm. AAlroy Dust,  RCIS  held pressure for sheath removal.Post procedural instructions discussed and return demonstration from patient. Bed rest starts @ 1230.

## 2015-07-12 NOTE — Anesthesia Preprocedure Evaluation (Addendum)
Anesthesia Evaluation  Patient identified by MRN, date of birth, ID band Patient awake    Reviewed: Allergy & Precautions, NPO status , Patient's Chart, lab work & pertinent test results  Airway Mallampati: II  TM Distance: >3 FB Neck ROM: Full    Dental   Pulmonary shortness of breath, asthma , pneumonia, former smoker,    breath sounds clear to auscultation       Cardiovascular hypertension, + dysrhythmias Atrial Fibrillation  Rhythm:Irregular Rate:Normal     Neuro/Psych    GI/Hepatic negative GI ROS, Neg liver ROS,   Endo/Other  Hypothyroidism   Renal/GU negative Renal ROS     Musculoskeletal   Abdominal   Peds  Hematology   Anesthesia Other Findings   Reproductive/Obstetrics                            Anesthesia Physical Anesthesia Plan  ASA: III  Anesthesia Plan:    Post-op Pain Management:    Induction: Intravenous  Airway Management Planned: Oral ETT  Additional Equipment:   Intra-op Plan:   Post-operative Plan: Possible Post-op intubation/ventilation  Informed Consent: I have reviewed the patients History and Physical, chart, labs and discussed the procedure including the risks, benefits and alternatives for the proposed anesthesia with the patient or authorized representative who has indicated his/her understanding and acceptance.   Dental advisory given  Plan Discussed with: CRNA and Anesthesiologist  Anesthesia Plan Comments:         Anesthesia Quick Evaluation

## 2015-07-12 NOTE — Progress Notes (Signed)
Pt is now in A-fib in the low 100's. Dr Rayann Heman has been notified.

## 2015-07-12 NOTE — Progress Notes (Addendum)
ERAF post ablation but very stable.  Would resume home medicines and anticipate discharge in am. Given bradycardia in sinus, would reduce or hold metoprolol at discharge.  Would follow-up 11/10 as scheduled in the AF clinic.  Add Protonix 40mg  daily x 6 weeks.  Follow-up in AF clinic 11/10 I will see again in 3 months  Thompson Grayer MD, [facility] 07/12/2015 9:36 PM

## 2015-07-12 NOTE — H&P (Signed)
Chief Complaint  Patient presents with  . Persistent AFIB   History of Present Illness: Carrie Potter is a 64 y.o. female who presents today for electrophysiology evaluation. She has been followed closely in the AF clinic. She has a h/o lonstanding persistent afib (3 years). She was hospitalized 8/16 thru 8/18 for tikosyn load and required cardioversion with subsequent sinus brady in the 40's and BB was held on discharge.QTc remained stable. She has since had additional sinus bradycardia for which she has been seen at [facility] ED. Unfortunately, she also continues to have afib on tikosyn. She reports feeling "Much better" in sinus rhythm and would like to continue to pursue rhythm control.  Today, she denies symptoms of chest pain, orthopnea, PND, lower extremity edema, claudication, dizziness, presyncope, syncope, bleeding, or neurologic sequela. The patient is tolerating medications without difficulties and is otherwise without complaint today.    Past Medical History  Diagnosis Date  . HTN (hypertension)   . Asthma   . Hyperlipidemia   . Hypothyroidism   . Obesity   . Palpitation   . Atrial fibrillation   . Walking pneumonia ~ 10/2014  . Dysrhythmia     atrial fibrillation   Past Surgical History  Procedure Laterality Date  . Cardioversion  03/08/2012    Procedure: CARDIOVERSION; Surgeon: Candee Furbish, MD; Location: [facility] OR; Service: Cardiovascular; Laterality: N/A;  . Tonsillectomy    . Dilation and curettage of uterus    . Abdominal hysterectomy    . Tubal ligation    . Fracture surgery    . Ankle fracture surgery Right ?1999  . Cardioversion N/A 05/02/2015    Procedure: CARDIOVERSION; Surgeon: Jerline Pain, MD; Location: [place]; Service: Cardiovascular; Laterality: N/A;  . Cardioversion N/A 05/16/2015    Procedure: CARDIOVERSION; Surgeon: Sueanne Margarita, MD;  Location: [place] [facility]; Service: Cardiovascular; Laterality: N/A;     Current Outpatient Prescriptions  Medication Sig Dispense Refill  . acetaminophen (TYLENOL) 500 MG tablet Take 500-1,000 mg by mouth every 6 (six) hours as needed for mild pain or moderate pain.     Marland Kitchen albuterol (PROVENTIL HFA;VENTOLIN HFA) 108 (90 BASE) MCG/ACT inhaler Inhale 2 puffs into the lungs every 4 (four) hours as needed for shortness of breath.     . dofetilide (TIKOSYN) 500 MCG capsule Take 1 capsule (500 mcg total) by mouth 2 (two) times daily. 180 capsule 3  . levothyroxine (SYNTHROID, LEVOTHROID) 125 MCG tablet Take 125 mcg by mouth daily.    . metoprolol succinate (TOPROL-XL) 50 MG 24 hr tablet Take 50 mg by mouth 2 (two) times daily.     . Multiple Vitamin (MULTIVITAMIN) tablet Take 2 tablets by mouth daily. Women's Gummies    . Probiotic Product (PROBIOTIC PO) Take 1 tablet by mouth daily.    . rivaroxaban (XARELTO) 20 MG TABS tablet Take 1 tablet (20 mg total) by mouth daily. 30 tablet 11  . tolterodine (DETROL) 2 MG tablet Take 2 mg by mouth daily.    . triazolam (HALCION) 0.25 MG tablet Take 0.25 mg by mouth as directed. Take before Dental appts.  0  . valACYclovir (VALTREX) 1000 MG tablet Take 1,000 mg by mouth 2 (two) times daily as needed. Cold sore    . spironolactone (ALDACTONE) 25 MG tablet Take 0.5 tablets (12.5 mg total) by mouth daily. 45 tablet 3   No current facility-administered medications for this visit.    Allergies: Lisinopril; Penicillins; and Diltiazem   Social History: The patient  reports that she has quit smoking. Her smoking use included Cigarettes. She has a 3 pack-year smoking history. She has never used smokeless tobacco. She reports that she drinks about 2.4 oz of alcohol per week. She reports that she does not use illicit drugs.   Family History: The patient's family history includes Breast cancer in  her mother; COPD in her father; Cancer in her father and mother; Heart disease in her father; Hypertension in her mother.    ROS: Please see the history of present illness. All other systems are reviewed and negative.    PHYSICAL EXAM: Filed Vitals:   07/12/15 0549  BP: 143/106  Pulse: 96  Temp: 97.8 F (36.6 C)  Resp: 18   GEN: Well nourished, well developed, in no acute distress  HEENT: normal  Neck: no JVD, carotid bruits, or masses Cardiac: iRRR; no murmurs, rubs, or gallops,no edema  Respiratory: clear to auscultation bilaterally, normal work of breathing GI: soft, nontender, nondistended, + BS MS: no deformity or atrophy  Skin: warm and dry  Neuro: Strength and sensation are intact Psych: euthymic mood, full affect  Recent Labs: 04/17/2015: TSH 0.857 05/14/2015: Magnesium 2.2 05/29/2015: BUN 18; Creatinine, Ser 0.82; Hemoglobin 13.8; Platelets 152; Potassium 4.3; Sodium 136    Lipid Panel   Labs (Brief)    No results found for: CHOL, TRIG, HDL, CHOLHDL, VLDL, LDLCALC, LDLDIRECT     Wt Readings from Last 3 Encounters:  05/31/15 234 lb 3.2 oz (106.232 kg)  05/30/15 231 lb 6.4 oz (104.962 kg)  05/21/15 230 lb 3.2 oz (104.418 kg)      Other studies Reviewed: Additional studies/ records that were reviewed today include: AF clinic notes are reviewed   ASSESSMENT AND PLAN:  1. Persistent atrial fibrillation The patient has symptomatic persistent afib. She has failed medical therapy with tikosyn. She has a chads2vasc score of 2. She is on xarelto.  TEE is reviewed. Therapeutic strategies for afib including medicine and ablation were discussed in detail with the patient today. Risk, benefits, and alternatives to EP study and radiofrequency ablation for afib were also discussed in detail today. These risks include but are not limited to stroke, bleeding, vascular damage, tamponade, perforation, damage to the esophagus, lungs, and other structures,  pulmonary vein stenosis, worsening renal function, and death. The patient understands these risk and wishes to proceed.   Thompson Grayer MD, [facility] 07/12/2015 7:32 AM

## 2015-07-12 NOTE — Transfer of Care (Signed)
Immediate Anesthesia Transfer of Care Note  Patient: Carrie Potter  Procedure(s) Performed: Procedure(s): Atrial Fibrillation Ablation (N/A)  Patient Location: PACU  Anesthesia Type:General  Level of Consciousness: awake, alert  and oriented  Airway & Oxygen Therapy: Patient Spontanous Breathing and Patient connected to nasal cannula oxygen  Post-op Assessment: Report given to RN and Post -op Vital signs reviewed and stable  Post vital signs: Reviewed and stable  Last Vitals:  Filed Vitals:   07/12/15 1155  BP: 153/75  Pulse:   Temp:   Resp:     Complications: No apparent anesthesia complications

## 2015-07-12 NOTE — Anesthesia Procedure Notes (Signed)
Procedure Name: Intubation Date/Time: 07/12/2015 8:40 AM Performed by: Clearnce Sorrel Pre-anesthesia Checklist: Patient identified, Emergency Drugs available, Suction available, Patient being monitored and Timeout performed Patient Re-evaluated:Patient Re-evaluated prior to inductionOxygen Delivery Method: Circle system utilized Preoxygenation: Pre-oxygenation with 100% oxygen Intubation Type: IV induction Ventilation: Mask ventilation without difficulty Laryngoscope Size: Glidescope and 4 Grade View: Grade I Tube type: Oral Tube size: 7.0 mm Number of attempts: 1 Placement Confirmation: ETT inserted through vocal cords under direct vision,  positive ETCO2 and breath sounds checked- equal and bilateral Secured at: 22 cm Tube secured with: Tape Dental Injury: Teeth and Oropharynx as per pre-operative assessment

## 2015-07-13 DIAGNOSIS — I481 Persistent atrial fibrillation: Secondary | ICD-10-CM | POA: Diagnosis not present

## 2015-07-13 DIAGNOSIS — E785 Hyperlipidemia, unspecified: Secondary | ICD-10-CM | POA: Diagnosis not present

## 2015-07-13 DIAGNOSIS — E039 Hypothyroidism, unspecified: Secondary | ICD-10-CM | POA: Diagnosis not present

## 2015-07-13 DIAGNOSIS — I495 Sick sinus syndrome: Secondary | ICD-10-CM | POA: Diagnosis not present

## 2015-07-13 LAB — BASIC METABOLIC PANEL
ANION GAP: 9 (ref 5–15)
BUN: 11 mg/dL (ref 6–20)
CALCIUM: 8.9 mg/dL (ref 8.9–10.3)
CHLORIDE: 100 mmol/L — AB (ref 101–111)
CO2: 25 mmol/L (ref 22–32)
Creatinine, Ser: 0.73 mg/dL (ref 0.44–1.00)
GFR calc non Af Amer: >60 mL/min (ref >60)
Glucose, Bld: 106 mg/dL — ABNORMAL HIGH (ref 65–99)
Potassium: 5.1 mmol/L (ref 3.5–5.1)
SODIUM: 134 mmol/L — AB (ref 135–145)

## 2015-07-13 MED ORDER — PANTOPRAZOLE SODIUM 40 MG PO TBEC: 40.0000 mg | DELAYED_RELEASE_TABLET | Freq: Every day | ORAL | Status: DC

## 2015-07-13 NOTE — Progress Notes (Signed)
Pt aax3, d/c instructions given to pt and husband to include meds, diet, activity restrictions and fallow up. Pt and husband verbalize understanding with d/c instructions. Pt d/c to care of husband in no apparent distress, wheeled out to lobby.

## 2015-07-13 NOTE — Progress Notes (Signed)
Patient ID: Carrie Potter, female   DOB: [birth year], 64 y.o.   MRN: [id number]    Patient Name: Carrie Potter Date of Encounter: 07/13/2015     Active Problems:   Atrial fibrillation (HCC)   Bradycardia   A-fib (HCC)    SUBJECTIVE  No chest pain or sob.   CURRENT MEDS . dofetilide  500 mcg Oral BID  . levothyroxine  125 mcg Oral QAC breakfast  . rivaroxaban  20 mg Oral Q supper  . sodium chloride  3 mL Intravenous Q12H  . spironolactone  12.5 mg Oral Daily    OBJECTIVE  Filed Vitals:   07/12/15 2350 07/13/15 0200 07/13/15 0447 07/13/15 0807  BP: 148/95 147/87 123/78 144/89  Pulse: 98  80 106  Temp: 97.8 F (36.6 C)  97.7 F (36.5 C) 98.2 F (36.8 C)  TempSrc: Oral  Oral Oral  Resp: 13  16 16   Height:      Weight:      SpO2: 95%  95% 98%    Intake/Output Summary (Last 24 hours) at 07/13/15 1011 Last data filed at 07/13/15 0400  Gross per 24 hour  Intake   1670 ml  Output   1575 ml  Net     95 ml   Filed Weights   07/12/15 0549 07/12/15 1515  Weight: 225 lb (102.059 kg) 247 lb 5.7 oz (112.2 kg)    PHYSICAL EXAM  General: Pleasant, NAD. Neuro: Alert and oriented X 3. Moves all extremities spontaneously. Psych: Normal affect. HEENT:  Normal  Neck: Supple without bruits or JVD. Lungs:  Resp regular and unlabored, CTA. Heart: IRRR no s3, s4, or murmurs. Abdomen: Soft, non-tender, non-distended, BS + x 4.  Extremities: No clubbing, cyanosis or edema. DP/PT/Radials 2+ and equal bilaterally.  Accessory Clinical Findings  CBC  Recent Labs  07/12/15 0632  WBC 7.9  HGB 13.4  HCT 41.5  MCV 107.0*  PLT 081   Basic Metabolic Panel  Recent Labs  07/12/15 0632 07/13/15 0321  NA 140 134*  K 4.5 5.1  CL 101 100*  CO2 28 25  GLUCOSE 114* 106*  BUN 19 11  CREATININE 0.75 0.73  CALCIUM 9.9 8.9   Liver Function Tests No results for input(s): AST, ALT, ALKPHOS, BILITOT, PROT, ALBUMIN in the last 72 hours. No results for input(s): LIPASE,  AMYLASE in the last 72 hours. Cardiac Enzymes No results for input(s): CKTOTAL, CKMB, CKMBINDEX, TROPONINI in the last 72 hours. BNP Invalid input(s): POCBNP D-Dimer No results for input(s): DDIMER in the last 72 hours. Hemoglobin A1C No results for input(s): HGBA1C in the last 72 hours. Fasting Lipid Panel No results for input(s): CHOL, HDL, LDLCALC, TRIG, CHOLHDL, LDLDIRECT in the last 72 hours. Thyroid Function Tests No results for input(s): TSH, T4TOTAL, T3FREE, THYROIDAB in the last 72 hours.  Invalid input(s): FREET3  TELE  Atrial fib with a controlled VR  Radiology/Studies  No results found.  ASSESSMENT AND PLAN  1. Persistent Atrial fib 2. S/p atrial fib ablation with ERAF Rec: ok for discharge home. Followup as outlined by Dr. Rayann Heman.   Gregg Taylor,M.D.  07/13/2015 10:11 AM

## 2015-07-13 NOTE — Discharge Summary (Signed)
Physician Discharge Summary  Patient ID: Carrie Potter MRN: [id number] DOB/AGE: 1950-11-21 64 y.o.   Primary Cardiologist: Dr. Marlou Porch Electrophysiologist: Dr. Rayann Heman  Admit date: 07/12/2015 Discharge date: 07/13/2015  Admission Diagnoses: Atrial Fibrillation  Discharge Diagnoses:  Active Problems:   Atrial fibrillation (HCC)   Bradycardia   A-fib (HCC)   Discharged Condition: stable  Hospital Course: 64 y/o female admitted for elective Afib ablation. The procedure was performed by Dr. Rayann Heman 07/12/15. She underwent successful electrical isolation and anatomical encircling of all four pulmonary veins with radiofrequency current. Additional ablation was performed at the RA/SVC junction. Her atrial fibrillation successfuly cardioverted to SR. She was observered overnight and had no post procedural complications. Given sinus bradycardia post ablation, her metoprolol was held. She denied CP and dyspnea. Vital signs remained stable. She was last seen and examined by Dr. Lovena Le who determined she was stable for discharge home. She was continued on Tikosyn and Xarelto. 40 mg of Protonix was also prescribed x 6 weeks. She will f/u in the afib clinic 07/25/15 and with Dr. Rayann Heman in 3 months.   Consults: None  Significant Diagnostic Studies:  See EPS note  Treatments: See hospital course  Discharge Exam: Blood pressure 144/89, pulse 106, temperature 98.2 F (36.8 C), temperature source Oral, resp. rate 16, height 5\' 4"  (1.626 m), weight 247 lb 5.7 oz (112.2 kg), SpO2 98 %.   Disposition: 81-Discharged to home/self-care with a planned acute care hospital inpt readmission      Discharge Instructions    Diet - low sodium heart healthy    Complete by:  As directed      Increase activity slowly    Complete by:  As directed             Medication List    STOP taking these medications        metoprolol succinate 50 MG 24 hr tablet  Commonly known as:  TOPROL-XL      TAKE  these medications        acetaminophen 500 MG tablet  Commonly known as:  TYLENOL  Take 500-1,000 mg by mouth every 6 (six) hours as needed for mild pain or moderate pain.     albuterol 108 (90 BASE) MCG/ACT inhaler  Commonly known as:  PROVENTIL HFA;VENTOLIN HFA  Inhale 2 puffs into the lungs every 4 (four) hours as needed for shortness of breath.     dofetilide 500 MCG capsule  Commonly known as:  TIKOSYN  Take 1 capsule (500 mcg total) by mouth 2 (two) times daily.     levothyroxine 125 MCG tablet  Commonly known as:  SYNTHROID, LEVOTHROID  Take 125 mcg by mouth daily.     multivitamin tablet  Take 2 tablets by mouth daily. Women's Gummies     pantoprazole 40 MG tablet  Commonly known as:  PROTONIX  Take 1 tablet (40 mg total) by mouth daily.     PROBIOTIC PO  Take 1 tablet by mouth daily.     rivaroxaban 20 MG Tabs tablet  Commonly known as:  XARELTO  Take 1 tablet (20 mg total) by mouth daily.     spironolactone 25 MG tablet  Commonly known as:  ALDACTONE  Take 0.5 tablets (12.5 mg total) by mouth daily.     tolterodine 2 MG tablet  Commonly known as:  DETROL  Take 2 mg by mouth daily.     triazolam 0.25 MG tablet  Commonly known as:  HALCION  Take 0.25 mg  by mouth as directed. Take before Dental appts.     valACYclovir 1000 MG tablet  Commonly known as:  VALTREX  Take 1,000 mg by mouth 2 (two) times daily as needed. Cold sore       Follow-up Information    Follow up with Candee Furbish, MD On 08/12/2015.   Specialty:  Cardiology   Why:  10:45 AM   Contact information:   [address] [address] [phone]       Follow up with Roderic Palau, NP On 07/25/2015.   Specialties:  Nurse Practitioner, Cardiology   Why:  3:30 PM   Contact information:   [place] [id number] [phone]      TIME SPENT ON DISCHARGE, INCLUDING PHYSICIAN TIME: >30 MINUTES  Signed: Lyda Jester 07/13/2015, 10:51  AM  EP Attending  Patient seen and examined. Agree with above. She is stable for discharge. Followup as outlined above.  Mikle Bosworth.D.

## 2015-07-15 ENCOUNTER — Encounter: Payer: Self-pay | Admitting: Internal Medicine

## 2015-07-17 NOTE — Anesthesia Postprocedure Evaluation (Signed)
  Anesthesia Post-op Note  Patient: Carrie Potter  Procedure(s) Performed: Procedure(s): Atrial Fibrillation Ablation (N/A)  Patient Location: PACU  Anesthesia Type:General  Level of Consciousness: awake  Airway and Oxygen Therapy: Patient Spontanous Breathing  Post-op Pain: mild  Post-op Assessment: Post-op Vital signs reviewed              Post-op Vital Signs: Reviewed  Last Vitals:  Filed Vitals:   07/13/15 0807  BP: 144/89  Pulse: 106  Temp: 36.8 C  Resp: 16    Complications: No apparent anesthesia complications

## 2015-07-25 ENCOUNTER — Ambulatory Visit (HOSPITAL_COMMUNITY)
Admission: RE | Admit: 2015-07-25 | Discharge: 2015-07-25 | Disposition: A | Discharge status: Home / Self Care | Payer: BC Managed Care – PPO | Source: Ambulatory Visit | Attending: Nurse Practitioner | Admitting: Nurse Practitioner

## 2015-07-25 VITALS — BP 138/82 | HR 102 | Ht 65.0 in | Wt 232.4 lb

## 2015-07-25 DIAGNOSIS — I481 Persistent atrial fibrillation: Secondary | ICD-10-CM | POA: Diagnosis not present

## 2015-07-25 DIAGNOSIS — I10 Essential (primary) hypertension: Secondary | ICD-10-CM | POA: Diagnosis not present

## 2015-07-25 DIAGNOSIS — I4819 Other persistent atrial fibrillation: Secondary | ICD-10-CM

## 2015-07-25 DIAGNOSIS — I4891 Unspecified atrial fibrillation: Secondary | ICD-10-CM | POA: Insufficient documentation

## 2015-07-26 NOTE — Progress Notes (Signed)
Patient ID: Carrie Potter, female   DOB: 1951-05-01, 64 y.o.   MRN: [id number]     Primary Care Physician: Antony Blackbird, MD Referring Physician: Dr. Etheleen Sia is a 64 y.o. female with a h/o persistent afib, having failed tikosyn to restore sine rhythm and underwent afib ablation 10/28,initially  she was in SR but had ERAF and was discharged in afib. BB's were stopped due to h/o temination pauses and bradycardia in SR. Pt reports today that no issues with swallowing or rt groin since procedure. She did return to SR for about a week after d/c but unfortunately has been back in afib for about a week. Overall, her afib is not making her symptomatic and v rates have been for most part controlled. BP has been controlled.  Today, she denies symptoms of palpitations, chest pain, shortness of breath, orthopnea, PND, lower extremity edema, dizziness, presyncope, syncope, or neurologic sequela. The patient is tolerating medications without difficulties and is otherwise without complaint today.   Past Medical History  Diagnosis Date  . HTN (hypertension)   . Asthma   . Hyperlipidemia   . Hypothyroidism   . Obesity   . Palpitation   . Atrial fibrillation ([facility])   . Walking pneumonia ~ 10/2014  . Dysrhythmia     atrial fibrillation   Past Surgical History  Procedure Laterality Date  . Cardioversion  03/08/2012    Procedure: CARDIOVERSION;  Surgeon: Candee Furbish, MD;  Location: [facility] OR;  Service: Cardiovascular;  Laterality: N/A;  . Tonsillectomy    . Dilation and curettage of uterus    . Abdominal hysterectomy    . Tubal ligation    . Fracture surgery    . Ankle fracture surgery Right ?1999  . Cardioversion N/A 05/02/2015    Procedure: CARDIOVERSION;  Surgeon: Jerline Pain, MD;  Location: [facility];  Service: Cardiovascular;  Laterality: N/A;  . Cardioversion N/A 05/16/2015    Procedure: CARDIOVERSION;  Surgeon: Sueanne Margarita, MD;  [facility];  Service:  Cardiovascular;  Laterality: N/A;  . Tee without cardioversion N/A 07/11/2015    Procedure: TRANSESOPHAGEAL ECHOCARDIOGRAM (TEE);  Surgeon: Skeet Latch, MD;  Location: [facility];  Service: Cardiovascular;  Laterality: N/A;  . Electrophysiologic study N/A 07/12/2015    Procedure: Atrial Fibrillation Ablation;  Surgeon: Thompson Grayer, MD;  Location: [facility];  Service: Cardiovascular;  Laterality: N/A;    Current Outpatient Prescriptions  Medication Sig Dispense Refill  . acetaminophen (TYLENOL) 500 MG tablet Take 500-1,000 mg by mouth every 6 (six) hours as needed for mild pain or moderate pain.     Marland Kitchen albuterol (PROVENTIL HFA;VENTOLIN HFA) 108 (90 BASE) MCG/ACT inhaler Inhale 2 puffs into the lungs every 4 (four) hours as needed for shortness of breath.     . dofetilide (TIKOSYN) 500 MCG capsule Take 1 capsule (500 mcg total) by mouth 2 (two) times daily. 180 capsule 3  . levothyroxine (SYNTHROID, LEVOTHROID) 125 MCG tablet Take 125 mcg by mouth daily.    . Multiple Vitamin (MULTIVITAMIN) tablet Take 2 tablets by mouth daily. Women's Gummies    . nystatin (MYCOSTATIN/NYSTOP) 100000 UNIT/GM POWD     . pantoprazole (PROTONIX) 40 MG tablet Take 1 tablet (40 mg total) by mouth daily. 45 tablet 0  . Probiotic Product (PROBIOTIC PO) Take 1 tablet by mouth daily.    . rivaroxaban (XARELTO) 20 MG TABS tablet Take 1 tablet (20 mg total) by mouth daily. 30 tablet 11  .  spironolactone (ALDACTONE) 25 MG tablet Take 0.5 tablets (12.5 mg total) by mouth daily. 45 tablet 3  . tolterodine (DETROL) 2 MG tablet Take 2 mg by mouth daily.    . triazolam (HALCION) 0.25 MG tablet Take 0.25 mg by mouth as directed. Take before Dental appts.  0  . valACYclovir (VALTREX) 1000 MG tablet Take 1,000 mg by mouth 2 (two) times daily as needed. Cold sore     No current facility-administered medications for this encounter.    Allergies  Allergen Reactions  . Lisinopril Swelling  . Penicillins Hives     Has patient had a PCN reaction causing immediate rash, facial/tongue/throat swelling, SOB or lightheadedness with hypotension: Yes Has patient had a PCN reaction causing severe rash involving mucus membranes or skin necrosis: No Has patient had a PCN reaction that required hospitalization No Has patient had a PCN reaction occurring within the last 10 years: No If all of the above answers are "NO", then may proceed with Cephalosporin use.  . Diltiazem Rash    Social History   Social History  . Marital Status: Married    Spouse Name: N/A  . Number of Children: N/A  . Years of Education: N/A   Occupational History  . Not on file.   Social History Main Topics  . Smoking status: Former Smoker -- 1.00 packs/day for 3 years    Types: Cigarettes  . Smokeless tobacco: Never Used     Comment: "quit smoking ~ 1976"  . Alcohol Use: 2.4 oz/week    4 Cans of beer per week  . Drug Use: No  . Sexual Activity: Yes   Other Topics Concern  . Not on file   Social History Narrative    Family History  Problem Relation Age of Onset  . COPD Father   . Heart disease Father   . Cancer Father     skin  . Breast cancer Mother   . Cancer Mother     skin  . Hypertension Mother     ROS- All systems are reviewed and negative except as per the HPI above  Physical Exam: Filed Vitals:   07/25/15 1539  BP: 138/82  Pulse: 102  Height: 5\' 5"  (1.651 m)  Weight: 232 lb 6.4 oz (105.416 kg)    GEN- The patient is well appearing, alert and oriented x 3 today.   Head- normocephalic, atraumatic Eyes-  Sclera clear, conjunctiva pink Ears- hearing intact Oropharynx- clear Neck- supple, no JVP Lymph- no cervical lymphadenopathy Lungs- Clear to ausculation bilaterally, normal work of breathing Heart-Irregular rate and rhythm, no murmurs, rubs or gallops, PMI not laterally displaced GI- soft, NT, ND, + BS Extremities- no clubbing, cyanosis, or edema MS- no significant deformity or atrophy Skin-  no rash or lesion Psych- euthymic mood, full affect Neuro- strength and sensation are intact  EKG-afib with rvr at 102 bpm, qrs int 68 ms, qtc 505 ms( has a tendency to run around 500 ms in afib, but when in SR, qtc much shorter around 470 ms) Epic records reviewed  Assessment and Plan: 1. afib S/p ablation with ERAF Pt currently feels well and it is early in healing process Continue tikosyn and xarelto Stay off BB due to termination pauses and brady when in SR in past  2. HTN Controlled  F/u with Dr. Etter Sjogren 11/28, if pt still in afib, consider cardioversion per Dr. Rayann Heman.   Geroge Baseman [place], [facility] [place], [place]  [address] [phone]  

## 2015-08-12 ENCOUNTER — Encounter: Payer: Self-pay | Admitting: *Deleted

## 2015-08-12 ENCOUNTER — Encounter: Payer: Self-pay | Admitting: Cardiology

## 2015-08-12 ENCOUNTER — Ambulatory Visit (INDEPENDENT_AMBULATORY_CARE_PROVIDER_SITE_OTHER): Payer: BC Managed Care – PPO | Admitting: Cardiology

## 2015-08-12 VITALS — BP 138/78 | HR 94 | Ht 65.0 in | Wt 225.0 lb

## 2015-08-12 DIAGNOSIS — Z7901 Long term (current) use of anticoagulants: Secondary | ICD-10-CM | POA: Insufficient documentation

## 2015-08-12 DIAGNOSIS — I480 Paroxysmal atrial fibrillation: Secondary | ICD-10-CM | POA: Diagnosis not present

## 2015-08-12 LAB — CBC
HCT: 43.8 % (ref 36.0–46.0)
HEMOGLOBIN: 15.3 g/dL — AB (ref 12.0–15.0)
MCH: 34.6 pg — AB (ref 26.0–34.0)
MCHC: 34.9 g/dL (ref 30.0–36.0)
MCV: 99.1 fL (ref 78.0–100.0)
MPV: 10.1 fL (ref 8.6–12.4)
PLATELETS: 238 10*3/uL (ref 150–400)
RBC: 4.42 MIL/uL (ref 3.87–5.11)
RDW: 13.4 % (ref 11.5–15.5)
WBC: 6.6 10*3/uL (ref 4.0–10.5)

## 2015-08-12 LAB — BASIC METABOLIC PANEL
BUN: 18 mg/dL (ref 7–25)
CALCIUM: 9.9 mg/dL (ref 8.6–10.4)
CO2: 26 mmol/L (ref 20–31)
Chloride: 101 mmol/L (ref 98–110)
Creat: 0.74 mg/dL (ref 0.50–0.99)
Glucose, Bld: 87 mg/dL (ref 65–99)
POTASSIUM: 4.5 mmol/L (ref 3.5–5.3)
SODIUM: 139 mmol/L (ref 135–146)

## 2015-08-12 NOTE — Patient Instructions (Addendum)
Medication Instructions:  The current medical regimen is effective;  continue present plan and medications.  Labwork: Please have blood work today (CBC,BMP)  Testing/Procedures: Your physician has requested that you have a Cardioversion.  Electrical Cardioversion uses a jolt of electricity to your heart either through paddles or wired patches attached to your chest. This is a controlled, usually prescheduled, procedure. This procedure is done at the hospital and you are not awake during the procedure. You usually go home the day of the procedure. Please see the instruction sheet given to you today for more information.  Follow-Up: Follow up in 2 to 4 weeks after Cardioversion.  If you need a refill on your cardiac medications before your next appointment, please call your pharmacy.  Thank you for choosing [facility]!!

## 2015-08-12 NOTE — Progress Notes (Signed)
Cardiology Office Note   Date:  08/12/2015   ID:  Carrie Potter, DOB 17-Apr-1951, MRN [id number]  PCP:  Antony Blackbird, MD  Cardiologist:   Candee Furbish, MD   Chief Complaint  Patient presents with  . Follow-up    6 months  . Atrial Fibrillation      History of Present Illness: Carrie Potter is a 64 y.o. female who presents for paroxysmal atrial fibrillation follow-up post ablation 07/12/15. Metoprolol was held given sinus bradycardia post ablation, post termination pauses. She was continued on Tikosyn and Xarelto. Previously she had failed Tikosyn in restoring sinus rhythm.  After ablation, she did return to sinus rhythm for about a week but her a defibrillation is not making her symptomatically and ventricular rates for the most part of been controlled. EKG on 07/25/15 showed A. fib with ventricular rate of 102 bpm. QTC in sinus rhythm was much shorter at 470 ms. Has a tendency to run around 500 ms in atrial fibrillation. Per instruction from Dr. Rayann Heman we were to consider cardioversion if she was still in atrial fibrillation at today's appointment.    Past Medical History  Diagnosis Date  . HTN (hypertension)   . Asthma   . Hyperlipidemia   . Hypothyroidism   . Obesity   . Palpitation   . Atrial fibrillation ([facility])   . Walking pneumonia ~ 10/2014  . Dysrhythmia     atrial fibrillation    Past Surgical History  Procedure Laterality Date  . Cardioversion  03/08/2012    Procedure: CARDIOVERSION;  Surgeon: Candee Furbish, MD;  Location: [facility] OR;  Service: Cardiovascular;  Laterality: N/A;  . Tonsillectomy    . Dilation and curettage of uterus    . Abdominal hysterectomy    . Tubal ligation    . Fracture surgery    . Ankle fracture surgery Right ?1999  . Cardioversion N/A 05/02/2015    Procedure: CARDIOVERSION;  Surgeon: Jerline Pain, MD;  Location: [facility];  Service: Cardiovascular;  Laterality: N/A;  . Cardioversion N/A 05/16/2015    Procedure: CARDIOVERSION;   Surgeon: Sueanne Margarita, MD;  [facility];  Service: Cardiovascular;  Laterality: N/A;  . Tee without cardioversion N/A 07/11/2015    Procedure: TRANSESOPHAGEAL ECHOCARDIOGRAM (TEE);  Surgeon: Skeet Latch, MD;  Location: [facility];  Service: Cardiovascular;  Laterality: N/A;  . Electrophysiologic study N/A 07/12/2015    Procedure: Atrial Fibrillation Ablation;  Surgeon: Thompson Grayer, MD;  Location: [facility];  Service: Cardiovascular;  Laterality: N/A;     Current Outpatient Prescriptions  Medication Sig Dispense Refill  . acetaminophen (TYLENOL) 500 MG tablet Take 500-1,000 mg by mouth every 6 (six) hours as needed for mild pain or moderate pain.     Marland Kitchen albuterol (PROVENTIL HFA;VENTOLIN HFA) 108 (90 BASE) MCG/ACT inhaler Inhale 2 puffs into the lungs every 4 (four) hours as needed for shortness of breath.     . dofetilide (TIKOSYN) 500 MCG capsule Take 1 capsule (500 mcg total) by mouth 2 (two) times daily. 180 capsule 3  . levothyroxine (SYNTHROID, LEVOTHROID) 125 MCG tablet Take 125 mcg by mouth daily.    . Multiple Vitamin (MULTIVITAMIN) tablet Take 2 tablets by mouth daily. Women's Gummies    . pantoprazole (PROTONIX) 40 MG tablet Take 1 tablet (40 mg total) by mouth daily. 45 tablet 0  . Probiotic Product (PROBIOTIC PO) Take 1 tablet by mouth daily.    . rivaroxaban (XARELTO) 20 MG TABS tablet Take 1  tablet (20 mg total) by mouth daily. 30 tablet 11  . spironolactone (ALDACTONE) 25 MG tablet Take 0.5 tablets (12.5 mg total) by mouth daily. 45 tablet 3  . tolterodine (DETROL) 2 MG tablet Take 2 mg by mouth daily.    . triazolam (HALCION) 0.25 MG tablet Take 0.25 mg by mouth as directed. Take before Dental appts.  0  . valACYclovir (VALTREX) 1000 MG tablet Take 1,000 mg by mouth 2 (two) times daily as needed. Cold sore     No current facility-administered medications for this visit.    Allergies:   Lisinopril; Penicillins; and Diltiazem    Social History:   The patient  reports that she has quit smoking. Her smoking use included Cigarettes. She has a 3 pack-year smoking history. She has never used smokeless tobacco. She reports that she drinks about 2.4 oz of alcohol per week. She reports that she does not use illicit drugs.   Family History:  The patient's family history includes Breast cancer in her mother; COPD in her father; Cancer in her father and mother; Heart disease in her father; Hypertension in her mother.    ROS:  Please see the history of present illness.   Otherwise, review of systems are positive for none.   All other systems are reviewed and negative.    PHYSICAL EXAM: VS:  BP 138/78 mmHg  Pulse 94  Ht 5\' 5"  (1.651 m)  Wt 225 lb (102.059 kg)  BMI 37.44 kg/m2  SpO2 97% , BMI Body mass index is 37.44 kg/(m^2). GEN: Well nourished, well developed, in no acute distress HEENT: normal Neck: no JVD, carotid bruits, or masses Cardiac: Irregularly irregular, upper normal rate; no murmurs, rubs, or gallops,no edema  Respiratory:  clear to auscultation bilaterally, normal work of breathing GI: soft, nontender, nondistended, + BS, obesity MS: no deformity or atrophy Skin: warm and dry, no rash Neuro:  Strength and sensation are intact Psych: euthymic mood, full affect   EKG:  Prior EKG reviewed as above   Recent Labs: 04/17/2015: TSH 0.857 06/11/2015: Magnesium 2.0 07/12/2015: Hemoglobin 13.4; Platelets 170 07/13/2015: BUN 11; Creatinine, Ser 0.73; Potassium 5.1; Sodium 134*    Lipid Panel No results found for: CHOL, TRIG, HDL, CHOLHDL, VLDL, LDLCALC, LDLDIRECT    Wt Readings from Last 3 Encounters:  08/12/15 225 lb (102.059 kg)  07/25/15 232 lb 6.4 oz (105.416 kg)  07/12/15 247 lb 5.7 oz (112.2 kg)      Other studies Reviewed: Additional studies/ records that were reviewed today include: Prior office records, labs, EKG. Review of the above records demonstrates: As above   ASSESSMENT AND PLAN:  1.  Paroxysmal  atrial fibrillation-post ablation. We will go ahead and set up for cardioversion. Risks and benefits explained including possible pacemaker. I reviewed Dr. Jackalyn Lombard recommendation. Continue Tikosyn. Note, she does have postconversion pauses. She is off of metoprolol. Encourage decrease caffeine. She has stopped having the occasional glass of wine on the weekends.  2. Chronic anticoagulation-she has not missed any doses of Xarelto.  3. Obesity-continue to encourage weight loss.   Current medicines are reviewed at length with the patient today.  The patient does not have concerns regarding medicines.  The following changes have been made:  no change  Labs/ tests ordered today include: Cardioversion. Pre-cardioversion labs.  No orders of the defined types were placed in this encounter.     Disposition:   FU with [place] in 2-4 weeks post conversion.   Signed, Candee Furbish, MD  08/12/2015 11:35 AM    [address], [place], [place]  [address] Phone: [phone]; Fax: [phone]

## 2015-08-14 ENCOUNTER — Encounter (HOSPITAL_COMMUNITY): Payer: Self-pay | Admitting: *Deleted

## 2015-08-14 ENCOUNTER — Ambulatory Visit (HOSPITAL_COMMUNITY)
Admission: RE | Admit: 2015-08-14 | Discharge: 2015-08-14 | Disposition: A | Discharge status: Home / Self Care | Payer: BC Managed Care – PPO | Source: Ambulatory Visit | Attending: Cardiovascular Disease | Admitting: Cardiovascular Disease

## 2015-08-14 ENCOUNTER — Encounter (HOSPITAL_COMMUNITY)
Admission: RE | Disposition: A | Discharge status: Home / Self Care | Payer: Self-pay | Source: Ambulatory Visit | Attending: Cardiovascular Disease

## 2015-08-14 ENCOUNTER — Ambulatory Visit (HOSPITAL_COMMUNITY): Payer: BC Managed Care – PPO | Admitting: Certified Registered Nurse Anesthetist

## 2015-08-14 DIAGNOSIS — Z79899 Other long term (current) drug therapy: Secondary | ICD-10-CM | POA: Insufficient documentation

## 2015-08-14 DIAGNOSIS — Z7901 Long term (current) use of anticoagulants: Secondary | ICD-10-CM | POA: Insufficient documentation

## 2015-08-14 DIAGNOSIS — I480 Paroxysmal atrial fibrillation: Secondary | ICD-10-CM | POA: Diagnosis present

## 2015-08-14 DIAGNOSIS — Z87891 Personal history of nicotine dependence: Secondary | ICD-10-CM | POA: Insufficient documentation

## 2015-08-14 DIAGNOSIS — Z880 Allergy status to penicillin: Secondary | ICD-10-CM | POA: Insufficient documentation

## 2015-08-14 DIAGNOSIS — E785 Hyperlipidemia, unspecified: Secondary | ICD-10-CM | POA: Diagnosis not present

## 2015-08-14 DIAGNOSIS — E039 Hypothyroidism, unspecified: Secondary | ICD-10-CM | POA: Diagnosis not present

## 2015-08-14 DIAGNOSIS — J45909 Unspecified asthma, uncomplicated: Secondary | ICD-10-CM | POA: Insufficient documentation

## 2015-08-14 DIAGNOSIS — I4891 Unspecified atrial fibrillation: Secondary | ICD-10-CM | POA: Diagnosis not present

## 2015-08-14 DIAGNOSIS — I10 Essential (primary) hypertension: Secondary | ICD-10-CM | POA: Diagnosis not present

## 2015-08-14 HISTORY — PX: CARDIOVERSION: SHX1299

## 2015-08-14 SURGERY — CARDIOVERSION
Anesthesia: Monitor Anesthesia Care

## 2015-08-14 MED ORDER — SODIUM CHLORIDE 0.9 % IV SOLN
INTRAVENOUS | Status: DC
  Administered 2015-08-14: 12:00:00 via INTRAVENOUS

## 2015-08-14 MED ORDER — LIDOCAINE HCL (CARDIAC) 20 MG/ML IV SOLN
INTRAVENOUS | Status: DC | PRN
  Administered 2015-08-14: 20 mg via INTRAVENOUS

## 2015-08-14 MED ORDER — PROPOFOL 10 MG/ML IV BOLUS
INTRAVENOUS | Status: DC | PRN
  Administered 2015-08-14: 60 mg via INTRAVENOUS

## 2015-08-14 NOTE — Interval H&P Note (Signed)
History and Physical Interval Note:  08/14/2015 9:12 AM  Carrie Potter  has presented today for surgery, with the diagnosis of  AFIB  The various methods of treatment have been discussed with the patient and family. After consideration of risks, benefits and other options for treatment, the patient has consented to  Procedure(s): CARDIOVERSION (N/A) as a surgical intervention .  The patient's history has been reviewed, patient examined, no change in status, stable for surgery.  I have reviewed the patient's chart and labs.  Questions were answered to the patient's satisfaction.     Jenkins Rouge

## 2015-08-14 NOTE — Anesthesia Preprocedure Evaluation (Addendum)
Anesthesia Evaluation  Patient identified by MRN, date of birth, ID band Patient awake    Reviewed: Allergy & Precautions, NPO status , Patient's Chart, lab work & pertinent test results  Airway Mallampati: II  TM Distance: >3 FB Neck ROM: Full    Dental  (+) Missing,    Pulmonary shortness of breath, asthma , pneumonia, former smoker,    breath sounds clear to auscultation       Cardiovascular Exercise Tolerance: Poor hypertension, + dysrhythmias Atrial Fibrillation  Rhythm:Irregular Rate:Normal     Neuro/Psych    GI/Hepatic negative GI ROS, Neg liver ROS,   Endo/Other  Hypothyroidism Morbid obesity  Renal/GU negative Renal ROS     Musculoskeletal   Abdominal (+) + obese,  Abdomen: soft. Bowel sounds: normal.  Peds  Hematology   Anesthesia Other Findings   Reproductive/Obstetrics                           BP Readings from Last 3 Encounters:  08/14/15 162/114  08/12/15 138/78  07/25/15 138/82   Lab Results  Component Value Date   WBC 6.6 08/12/2015   HGB 15.3* 08/12/2015   HCT 43.8 08/12/2015   MCV 99.1 08/12/2015   PLT 238 08/12/2015     Chemistry      Component Value Date/Time   NA 139 08/12/2015 1237   K 4.5 08/12/2015 1237   CL 101 08/12/2015 1237   CO2 26 08/12/2015 1237   BUN 18 08/12/2015 1237   CREATININE 0.74 08/12/2015 1237   CREATININE 0.73 07/13/2015 0321      Component Value Date/Time   CALCIUM 9.9 08/12/2015 1237     Anesthesia Physical  Anesthesia Plan  ASA: III  Anesthesia Plan: MAC   Post-op Pain Management:    Induction: Intravenous  Airway Management Planned: Nasal Cannula  Additional Equipment:   Intra-op Plan:   Post-operative Plan:   Informed Consent: I have reviewed the patients History and Physical, chart, labs and discussed the procedure including the risks, benefits and alternatives for the proposed anesthesia with the patient or  authorized representative who has indicated his/her understanding and acceptance.   Dental advisory given  Plan Discussed with: CRNA and Anesthesiologist  Anesthesia Plan Comments:         Anesthesia Quick Evaluation

## 2015-08-14 NOTE — Anesthesia Procedure Notes (Signed)
Procedure Name: MAC Date/Time: 08/14/2015 1:02 PM Performed by: Layla Maw Pre-anesthesia Checklist: Patient identified, Emergency Drugs available, Suction available, Patient being monitored and Timeout performed Patient Re-evaluated:Patient Re-evaluated prior to inductionOxygen Delivery Method: Ambu bag and Nasal cannula Preoxygenation: Pre-oxygenation with 100% oxygen Intubation Type: IV induction Number of attempts: 1

## 2015-08-14 NOTE — H&P (View-Only) (Signed)
Cardiology Office Note   Date:  08/12/2015   ID:  Carrie Potter, DOB March 22, 1951, MRN [id number]  PCP:  Antony Blackbird, MD  Cardiologist:   Candee Furbish, MD   Chief Complaint  Patient presents with  . Follow-up    6 months  . Atrial Fibrillation      History of Present Illness: Carrie Potter is a 64 y.o. female who presents for paroxysmal atrial fibrillation follow-up post ablation 07/12/15. Metoprolol was held given sinus bradycardia post ablation, post termination pauses. She was continued on Tikosyn and Xarelto. Previously she had failed Tikosyn in restoring sinus rhythm.  After ablation, she did return to sinus rhythm for about a week but her a defibrillation is not making her symptomatically and ventricular rates for the most part of been controlled. EKG on 07/25/15 showed A. fib with ventricular rate of 102 bpm. QTC in sinus rhythm was much shorter at 470 ms. Has a tendency to run around 500 ms in atrial fibrillation. Per instruction from Dr. Rayann Heman we were to consider cardioversion if she was still in atrial fibrillation at today's appointment.    Past Medical History  Diagnosis Date  . HTN (hypertension)   . Asthma   . Hyperlipidemia   . Hypothyroidism   . Obesity   . Palpitation   . Atrial fibrillation ([place])   . Walking pneumonia ~ 10/2014  . Dysrhythmia     atrial fibrillation    Past Surgical History  Procedure Laterality Date  . Cardioversion  03/08/2012    Procedure: CARDIOVERSION;  Surgeon: Candee Furbish, MD;  Location: [facility] OR;  Service: Cardiovascular;  Laterality: N/A;  . Tonsillectomy    . Dilation and curettage of uterus    . Abdominal hysterectomy    . Tubal ligation    . Fracture surgery    . Ankle fracture surgery Right ?1999  . Cardioversion N/A 05/02/2015    Procedure: CARDIOVERSION;  Surgeon: Jerline Pain, MD;  Location: [place];  Service: Cardiovascular;  Laterality: N/A;  . Cardioversion N/A 05/16/2015    Procedure: CARDIOVERSION;   Surgeon: Sueanne Margarita, MD;  [facility];  Service: Cardiovascular;  Laterality: N/A;  . Tee without cardioversion N/A 07/11/2015    Procedure: TRANSESOPHAGEAL ECHOCARDIOGRAM (TEE);  Surgeon: Skeet Latch, MD;  Location: [place];  Service: Cardiovascular;  Laterality: N/A;  . Electrophysiologic study N/A 07/12/2015    Procedure: Atrial Fibrillation Ablation;  Surgeon: Thompson Grayer, MD;  Location: [place] CV LAB;  Service: Cardiovascular;  Laterality: N/A;     Current Outpatient Prescriptions  Medication Sig Dispense Refill  . acetaminophen (TYLENOL) 500 MG tablet Take 500-1,000 mg by mouth every 6 (six) hours as needed for mild pain or moderate pain.     Marland Kitchen albuterol (PROVENTIL HFA;VENTOLIN HFA) 108 (90 BASE) MCG/ACT inhaler Inhale 2 puffs into the lungs every 4 (four) hours as needed for shortness of breath.     . dofetilide (TIKOSYN) 500 MCG capsule Take 1 capsule (500 mcg total) by mouth 2 (two) times daily. 180 capsule 3  . levothyroxine (SYNTHROID, LEVOTHROID) 125 MCG tablet Take 125 mcg by mouth daily.    . Multiple Vitamin (MULTIVITAMIN) tablet Take 2 tablets by mouth daily. Women's Gummies    . pantoprazole (PROTONIX) 40 MG tablet Take 1 tablet (40 mg total) by mouth daily. 45 tablet 0  . Probiotic Product (PROBIOTIC PO) Take 1 tablet by mouth daily.    . rivaroxaban (XARELTO) 20 MG TABS tablet Take 1  tablet (20 mg total) by mouth daily. 30 tablet 11  . spironolactone (ALDACTONE) 25 MG tablet Take 0.5 tablets (12.5 mg total) by mouth daily. 45 tablet 3  . tolterodine (DETROL) 2 MG tablet Take 2 mg by mouth daily.    . triazolam (HALCION) 0.25 MG tablet Take 0.25 mg by mouth as directed. Take before Dental appts.  0  . valACYclovir (VALTREX) 1000 MG tablet Take 1,000 mg by mouth 2 (two) times daily as needed. Cold sore     No current facility-administered medications for this visit.    Allergies:   Lisinopril; Penicillins; and Diltiazem    Social History:   The patient  reports that she has quit smoking. Her smoking use included Cigarettes. She has a 3 pack-year smoking history. She has never used smokeless tobacco. She reports that she drinks about 2.4 oz of alcohol per week. She reports that she does not use illicit drugs.   Family History:  The patient's family history includes Breast cancer in her mother; COPD in her father; Cancer in her father and mother; Heart disease in her father; Hypertension in her mother.    ROS:  Please see the history of present illness.   Otherwise, review of systems are positive for none.   All other systems are reviewed and negative.    PHYSICAL EXAM: VS:  BP 138/78 mmHg  Pulse 94  Ht 5\' 5"  (1.651 m)  Wt 225 lb (102.059 kg)  BMI 37.44 kg/m2  SpO2 97% , BMI Body mass index is 37.44 kg/(m^2). GEN: Well nourished, well developed, in no acute distress HEENT: normal Neck: no JVD, carotid bruits, or masses Cardiac: Irregularly irregular, upper normal rate; no murmurs, rubs, or gallops,no edema  Respiratory:  clear to auscultation bilaterally, normal work of breathing GI: soft, nontender, nondistended, + BS, obesity MS: no deformity or atrophy Skin: warm and dry, no rash Neuro:  Strength and sensation are intact Psych: euthymic mood, full affect   EKG:  Prior EKG reviewed as above   Recent Labs: 04/17/2015: TSH 0.857 06/11/2015: Magnesium 2.0 07/12/2015: Hemoglobin 13.4; Platelets 170 07/13/2015: BUN 11; Creatinine, Ser 0.73; Potassium 5.1; Sodium 134*    Lipid Panel No results found for: CHOL, TRIG, HDL, CHOLHDL, VLDL, LDLCALC, LDLDIRECT    Wt Readings from Last 3 Encounters:  08/12/15 225 lb (102.059 kg)  07/25/15 232 lb 6.4 oz (105.416 kg)  07/12/15 247 lb 5.7 oz (112.2 kg)      Other studies Reviewed: Additional studies/ records that were reviewed today include: Prior office records, labs, EKG. Review of the above records demonstrates: As above   ASSESSMENT AND PLAN:  1.  Paroxysmal  atrial fibrillation-post ablation. We will go ahead and set up for cardioversion. Risks and benefits explained including possible pacemaker. I reviewed Dr. Jackalyn Lombard recommendation. Continue Tikosyn. Note, she does have postconversion pauses. She is off of metoprolol. Encourage decrease caffeine. She has stopped having the occasional glass of wine on the weekends.  2. Chronic anticoagulation-she has not missed any doses of Xarelto.  3. Obesity-continue to encourage weight loss.   Current medicines are reviewed at length with the patient today.  The patient does not have concerns regarding medicines.  The following changes have been made:  no change  Labs/ tests ordered today include: Cardioversion. Pre-cardioversion labs.  No orders of the defined types were placed in this encounter.     Disposition:   FU with [place] in 2-4 weeks post conversion.   Signed, Candee Furbish, MD  08/12/2015 11:35 AM    [address], [place], [place]  [address] Phone: [phone]; Fax: [phone]

## 2015-08-14 NOTE — Discharge Instructions (Signed)
Electrical Cardioversion, Care After °Refer to this sheet in the next few weeks. These instructions provide you with information on caring for yourself after your procedure. Your health care provider may also give you more specific instructions. Your treatment has been planned according to current medical practices, but problems sometimes occur. Call your health care provider if you have any problems or questions after your procedure. °WHAT TO EXPECT AFTER THE PROCEDURE °After your procedure, it is typical to have the following sensations: °· Some redness on the skin where the shocks were delivered. If this is tender, a sunburn lotion or hydrocortisone cream may help. °· Possible return of an abnormal heart rhythm within hours or days after the procedure. °HOME CARE INSTRUCTIONS °· Take medicines only as directed by your health care provider. Be sure you understand how and when to take your medicine. °· Learn how to feel your pulse and check it often. °· Limit your activity for 48 hours after the procedure or as directed by your health care provider. °· Avoid or minimize caffeine and other stimulants as directed by your health care provider. °SEEK MEDICAL CARE IF: °· You feel like your heart is beating too fast or your pulse is not regular. °· You have any questions about your medicines. °· You have bleeding that will not stop. °SEEK IMMEDIATE MEDICAL CARE IF: °· You are dizzy or feel faint. °· It is hard to breathe or you feel short of breath. °· There is a change in discomfort in your chest. °· Your speech is slurred or you have trouble moving an arm or leg on one side of your body. °· You get a serious muscle cramp that does not go away. °· Your fingers or toes turn cold or blue. °  °This information is not intended to replace advice given to you by your health care provider. Make sure you discuss any questions you have with your health care provider. °  °Document Released: 06/21/2013 Document Revised: 09/21/2014  Document Reviewed: 06/21/2013 °Elsevier Interactive Patient Education ©2016 [employer] ° °

## 2015-08-14 NOTE — Transfer of Care (Signed)
Immediate Anesthesia Transfer of Care Note  Patient: Carrie Potter  Procedure(s) Performed: Procedure(s): CARDIOVERSION (N/A)  Patient Location: PACU  Anesthesia Type:MAC  Level of Consciousness: awake, alert , oriented and patient cooperative  Airway & Oxygen Therapy: Patient Spontanous Breathing and Patient connected to nasal cannula oxygen  Post-op Assessment: Report given to RN, Post -op Vital signs reviewed and stable and Patient moving all extremities X 4  Post vital signs: Reviewed and stable  Last Vitals:  Filed Vitals:   08/14/15 1320 08/14/15 1321  BP:    Pulse: 54   Temp:  36.9 C  Resp: 16     Complications: No apparent anesthesia complications

## 2015-08-14 NOTE — CV Procedure (Signed)
DCC: Anesthesia:  Propofol/Lidocaine Dr Linna Caprice  ON Rx xarelto  Afib rate 115 Shock x 1 120 J  Converted to SB rate 28 then sped up to 52 with no AV block or wenkebach  No immediate neurologic sequelae  Jenkins Rouge

## 2015-08-15 ENCOUNTER — Encounter (HOSPITAL_COMMUNITY): Payer: Self-pay | Admitting: Cardiovascular Disease

## 2015-08-18 NOTE — Anesthesia Postprocedure Evaluation (Signed)
Anesthesia Post Note  Patient: Carrie Potter  Procedure(s) Performed: Procedure(s) (LRB): CARDIOVERSION (N/A)  Patient location during evaluation: Endoscopy Anesthesia Type: General Level of consciousness: awake and awake and alert Pain management: pain level controlled Vital Signs Assessment: post-procedure vital signs reviewed and stable Anesthetic complications: no    Last Vitals:  Filed Vitals:   08/14/15 1340 08/14/15 1350  BP: 125/54 126/52  Pulse: 76 57  Temp:    Resp: 13 20    Last Pain: There were no vitals filed for this visit.               Joelie Schou COKER

## 2015-08-27 ENCOUNTER — Other Ambulatory Visit: Payer: Self-pay | Admitting: Cardiology

## 2015-08-27 NOTE — Telephone Encounter (Signed)
REFILL 

## 2015-09-10 ENCOUNTER — Telehealth: Payer: Self-pay | Admitting: Cardiovascular Disease

## 2015-09-10 ENCOUNTER — Telehealth: Payer: Self-pay | Admitting: Cardiology

## 2015-09-10 ENCOUNTER — Ambulatory Visit (INDEPENDENT_AMBULATORY_CARE_PROVIDER_SITE_OTHER): Payer: BC Managed Care – PPO | Admitting: Cardiology

## 2015-09-10 ENCOUNTER — Other Ambulatory Visit (INDEPENDENT_AMBULATORY_CARE_PROVIDER_SITE_OTHER): Payer: BC Managed Care – PPO | Admitting: *Deleted

## 2015-09-10 ENCOUNTER — Encounter: Payer: Self-pay | Admitting: *Deleted

## 2015-09-10 ENCOUNTER — Encounter: Payer: Self-pay | Admitting: Cardiology

## 2015-09-10 VITALS — BP 140/70 | HR 109 | Ht 65.0 in | Wt 223.0 lb

## 2015-09-10 DIAGNOSIS — Z7901 Long term (current) use of anticoagulants: Secondary | ICD-10-CM | POA: Diagnosis not present

## 2015-09-10 DIAGNOSIS — I480 Paroxysmal atrial fibrillation: Secondary | ICD-10-CM

## 2015-09-10 DIAGNOSIS — E669 Obesity, unspecified: Secondary | ICD-10-CM

## 2015-09-10 DIAGNOSIS — R0600 Dyspnea, unspecified: Secondary | ICD-10-CM | POA: Diagnosis not present

## 2015-09-10 DIAGNOSIS — R001 Bradycardia, unspecified: Secondary | ICD-10-CM | POA: Diagnosis not present

## 2015-09-10 LAB — COMPREHENSIVE METABOLIC PANEL
ALBUMIN: 3.8 g/dL (ref 3.6–5.1)
ALK PHOS: 59 U/L (ref 33–130)
ALT: 20 U/L (ref 6–29)
AST: 21 U/L (ref 10–35)
BILIRUBIN TOTAL: 0.6 mg/dL (ref 0.2–1.2)
BUN: 16 mg/dL (ref 7–25)
CALCIUM: 9.5 mg/dL (ref 8.6–10.4)
CO2: 23 mmol/L (ref 20–31)
CREATININE: 0.8 mg/dL (ref 0.50–0.99)
Chloride: 101 mmol/L (ref 98–110)
GLUCOSE: 81 mg/dL (ref 65–99)
Potassium: 4.1 mmol/L (ref 3.5–5.3)
Sodium: 137 mmol/L (ref 135–146)
Total Protein: 6.3 g/dL (ref 6.1–8.1)

## 2015-09-10 LAB — CBC
HEMATOCRIT: 40.8 % (ref 36.0–46.0)
Hemoglobin: 14 g/dL (ref 12.0–15.0)
MCH: 34.1 pg — AB (ref 26.0–34.0)
MCHC: 34.3 g/dL (ref 30.0–36.0)
MCV: 99.5 fL (ref 78.0–100.0)
MPV: 9.8 fL (ref 8.6–12.4)
Platelets: 218 10*3/uL (ref 150–400)
RBC: 4.1 MIL/uL (ref 3.87–5.11)
RDW: 14.5 % (ref 11.5–15.5)
WBC: 10.6 10*3/uL — AB (ref 4.0–10.5)

## 2015-09-10 LAB — MAGNESIUM: Magnesium: 1.7 mg/dL (ref 1.5–2.5)

## 2015-09-10 LAB — TSH: TSH: 0.069 u[IU]/mL — ABNORMAL LOW (ref 0.350–4.500)

## 2015-09-10 MED ORDER — DOFETILIDE 125 MCG PO CAPS: 375.0000 ug | ORAL_CAPSULE | Freq: Two times a day (BID) | ORAL | Status: DC

## 2015-09-10 NOTE — Progress Notes (Signed)
Cardiology Office Note   Date:  09/10/2015   ID:  Carrie Potter, DOB 03/25/1951, MRN [id number]  PCP:  Antony Blackbird, MD  Cardiologist:  Dr. Marlou Porch    Chief Complaint  Patient presents with  . Tachycardia    HR  100-110       History of Present Illness: Carrie Potter is a 64 y.o. female who presents for post cardioversion follow up.   She has a hx. Of paroxysmal atrial fibrillation follow-up post ablation 07/12/15. Metoprolol was held given sinus bradycardia post ablation, post termination pauses. She was continued on Tikosyn and Xarelto. Previously she had failed Tikosyn in restoring sinus rhythm.  After ablation, she did return to sinus rhythm for about a week but she continued on a fib and was scheduled for DCCV 08/14/15 with 1 shock at 120 J, initially she returned to SB at 28 then to 52.  She is back today for follow-up.    Today she is back in a fib.  She states she was in normal rhythm for 3 days then back to a fib.  No chest pain, + DOE but tolerable most times.  Today her Qtc is prolonged.   Past Medical History  Diagnosis Date  . HTN (hypertension)   . Asthma   . Hyperlipidemia   . Hypothyroidism   . Obesity   . Palpitation   . Atrial fibrillation ([place])   . Walking pneumonia ~ 10/2014  . Dysrhythmia     atrial fibrillation    Past Surgical History  Procedure Laterality Date  . Cardioversion  03/08/2012    Procedure: CARDIOVERSION;  Surgeon: Candee Furbish, MD;  Location: [facility] OR;  Service: Cardiovascular;  Laterality: N/A;  . Tonsillectomy    . Dilation and curettage of uterus    . Abdominal hysterectomy    . Tubal ligation    . Fracture surgery    . Ankle fracture surgery Right ?1999  . Cardioversion N/A 05/02/2015    Procedure: CARDIOVERSION;  Surgeon: Jerline Pain, MD;  Location: [place];  Service: Cardiovascular;  Laterality: N/A;  . Cardioversion N/A 05/16/2015    Procedure: CARDIOVERSION;  Surgeon: Sueanne Margarita, MD;  Location: [facility];   Service: Cardiovascular;  Laterality: N/A;  . Tee without cardioversion N/A 07/11/2015    Procedure: TRANSESOPHAGEAL ECHOCARDIOGRAM (TEE);  Surgeon: Skeet Latch, MD;  Location: [place];  Service: Cardiovascular;  Laterality: N/A;  . Electrophysiologic study N/A 07/12/2015    Procedure: Atrial Fibrillation Ablation;  Surgeon: Thompson Grayer, MD;  Location: [place] CV LAB;  Service: Cardiovascular;  Laterality: N/A;  . Cardioversion N/A 08/14/2015    Procedure: CARDIOVERSION;  Surgeon: Josue Hector, MD;  Location: [facility] ENDOSCOPY;  Service: Cardiovascular;  Laterality: N/A;     Current Outpatient Prescriptions  Medication Sig Dispense Refill  . acetaminophen (TYLENOL) 500 MG tablet Take 500-1,000 mg by mouth every 6 (six) hours as needed for mild pain or moderate pain.     Marland Kitchen albuterol (PROVENTIL HFA;VENTOLIN HFA) 108 (90 BASE) MCG/ACT inhaler Inhale 2 puffs into the lungs every 4 (four) hours as needed for shortness of breath.     . levothyroxine (SYNTHROID, LEVOTHROID) 125 MCG tablet Take 125 mcg by mouth daily.    . Multiple Vitamin (MULTIVITAMIN) tablet Take 2 tablets by mouth daily. Women's Gummies    . pantoprazole (PROTONIX) 40 MG tablet TAKE 1 TABLET (40 MG TOTAL) BY MOUTH DAILY. 45 tablet 11  . Probiotic Product (PROBIOTIC PO) Take 1 tablet  by mouth daily.    . rivaroxaban (XARELTO) 20 MG TABS tablet Take 1 tablet (20 mg total) by mouth daily. 30 tablet 11  . spironolactone (ALDACTONE) 25 MG tablet Take 0.5 tablets (12.5 mg total) by mouth daily. 45 tablet 3  . tolterodine (DETROL) 2 MG tablet Take 2 mg by mouth daily.    . triazolam (HALCION) 0.25 MG tablet Take 0.25 mg by mouth as directed. Take before Dental appts.  0  . valACYclovir (VALTREX) 1000 MG tablet Take 1,000 mg by mouth 2 (two) times daily as needed. Cold sore    . dofetilide (TIKOSYN) 125 MCG capsule Take 3 capsules (375 mcg total) by mouth 2 (two) times daily. 90 capsule 1   No current facility-administered  medications for this visit.    Allergies:   Lisinopril; Penicillins; and Diltiazem    Social History:  The patient  reports that she has quit smoking. Her smoking use included Cigarettes. She has a 3 pack-year smoking history. She has never used smokeless tobacco. She reports that she drinks about 2.4 oz of alcohol per week. She reports that she does not use illicit drugs.   Family History:  The patient's family history includes Breast cancer in her mother; COPD in her father; Cancer in her father and mother; Heart disease in her father; Hypertension in her mother. There is no history of Heart attack or Stroke.    ROS:  General:no colds or fevers, no weight changes Skin:no rashes or ulcers HEENT:no blurred vision, no congestion CV:see HPI PUL:see HPI GI:no diarrhea constipation or melena, no indigestion GU:no hematuria, no dysuria MS:no joint pain, no claudication Neuro:no syncope, no lightheadedness Endo:no diabetes, + thyroid disease  Wt Readings from Last 3 Encounters:  09/10/15 223 lb (101.152 kg)  08/12/15 225 lb (102.059 kg)  07/25/15 232 lb 6.4 oz (105.416 kg)     PHYSICAL EXAM: VS:  BP 140/70 mmHg  Pulse 109  Ht 5\' 5"  (1.651 m)  Wt 223 lb (101.152 kg)  BMI 37.11 kg/m2 , BMI Body mass index is 37.11 kg/(m^2). General:Pleasant affect, NAD Skin:Warm and dry, brisk capillary refill HEENT:normocephalic, sclera clear, mucus membranes moist Neck:supple, no JVD, no bruits  Heart:irreg irreg without murmur, gallup, rub or click Lungs:clear without rales, rhonchi, or wheezes [id number], soft, non tender, + BS, do not palpate liver spleen or masses Ext:no lower ext edema, 2+ pedal pulses, 2+ radial pulses Neuro:alert and oriented, X 3, MAE, follows commands, + facial symmetry    EKG:  EKG is ordered today. The ekg ordered today demonstrates A fib rate 107 new from 08/14/15.    Recent Labs: 04/17/2015: TSH 0.857 06/11/2015: Magnesium 2.0 08/12/2015: BUN 18; Creat 0.74;  Hemoglobin 15.3*; Platelets 238; Potassium 4.5; Sodium 139    Lipid Panel No results found for: CHOL, TRIG, HDL, CHOLHDL, VLDL, LDLCALC, LDLDIRECT     Other studies Reviewed: Additional studies/ records that were reviewed today include: review of DCCV. EKG SB at 52. .   ASSESSMENT AND PLAN:  1.  PAF/persistent- SB lasted 3 days.  Now back in a fib rate 100-110 or so, but asymptomatic except DOE.  No chest pain.  Her Qtc 18  -discussed with Dr. Curt Bears  DOD  And will send note to Dr. Rayann Heman.  With prolonged Qtc will decrease tikosyn to 375 BID-  3 of 125 mcg BID. Plan DCCV next Tuesday.  Will check labs as well on Thursday and repeat EKG.   2. Chronic anticoagulation-she has not missed any doses  of Xarelto.  3. Obesity-continue to encourage weight loss   Current medicines are reviewed with the patient today.  The patient Has no concerns regarding medicines.  The following changes have been made:  See above Labs/ tests ordered today include:see above  Disposition:   FU:  see above  Signed, Isaiah Serge, NP  09/10/2015 11:16 AM    [facility] [place], [place], [place], [place] Phone: [phone]; Fax: [phone]

## 2015-09-10 NOTE — Telephone Encounter (Signed)
Pt rescheduled cardioversion to Friday 12/30 at 12N with Dr Meda Coffee.  Booking # [id number].  Pt coming in today for labs and is aware of the date and time change.

## 2015-09-10 NOTE — Telephone Encounter (Signed)
°  New Prob   Pt is requesting to reschedule her cardioversion as her deductible has increased. Please call.

## 2015-09-10 NOTE — Telephone Encounter (Deleted)
error 

## 2015-09-10 NOTE — Addendum Note (Signed)
Addended by: Eulis Foster on: 09/10/2015 01:16 PM   Modules accepted: Orders

## 2015-09-10 NOTE — Patient Instructions (Signed)
Medication Instructions:  1) DECREASE your Tikosyn to 122mcg- take 3 tabs twice daily  Labwork: Your physician recommends that you return for lab work on Thursday (CMP, TSH, Magnesium and CBC)   Testing/Procedures: .Your physician recommends that you have a cardioversion on September 17, 2015.  Follow-Up: Your physician recommends that you schedule a follow-up appointment in: the Afib Clinic for the week after your Cardioversion.  Your physician recommends that you schedule a follow-up appointment on Thursday for a nurse visit EKG.   Any Other Special Instructions Will Be Listed Below (If Applicable).     If you need a refill on your cardiac medications before your next appointment, please call your pharmacy.

## 2015-09-11 ENCOUNTER — Telehealth: Payer: Self-pay | Admitting: *Deleted

## 2015-09-11 DIAGNOSIS — I4891 Unspecified atrial fibrillation: Secondary | ICD-10-CM

## 2015-09-11 MED ORDER — MAGNESIUM OXIDE 400 MG PO TABS
400.0000 mg | ORAL_TABLET | Freq: Two times a day (BID) | ORAL | Status: DC
Start: 2015-09-11 — End: 2015-09-18

## 2015-09-11 NOTE — Telephone Encounter (Signed)
-----   Message from Isaiah Serge, NP sent at 09/11/2015  8:00 AM EST ----- Dr. Rayann Heman to review TSH.  Add mag. Oxide 400 mg BID for 1 week and recheck mag level next week.

## 2015-09-11 NOTE — Telephone Encounter (Signed)
Called pt to make her aware that Dr. Rayann Heman wanted her to take Mag Oxide 40-0 mg bid X 1 week then have Mag rechecked.  Pt verbalized understanding.  Medication sent to [facility] [address] and Lab order has been placed in [facility].

## 2015-09-12 ENCOUNTER — Other Ambulatory Visit: Payer: BC Managed Care – PPO

## 2015-09-13 ENCOUNTER — Ambulatory Visit (HOSPITAL_COMMUNITY): Payer: BC Managed Care – PPO | Admitting: Anesthesiology

## 2015-09-13 ENCOUNTER — Ambulatory Visit (HOSPITAL_COMMUNITY)
Admission: RE | Admit: 2015-09-13 | Discharge: 2015-09-13 | Disposition: A | Discharge status: Home / Self Care | Payer: BC Managed Care – PPO | Source: Ambulatory Visit | Attending: Cardiovascular Disease | Admitting: Cardiovascular Disease

## 2015-09-13 ENCOUNTER — Encounter (HOSPITAL_COMMUNITY)
Admission: RE | Disposition: A | Discharge status: Home / Self Care | Payer: Self-pay | Source: Ambulatory Visit | Attending: Cardiovascular Disease

## 2015-09-13 ENCOUNTER — Encounter (HOSPITAL_COMMUNITY): Payer: Self-pay | Admitting: *Deleted

## 2015-09-13 DIAGNOSIS — Z7901 Long term (current) use of anticoagulants: Secondary | ICD-10-CM | POA: Insufficient documentation

## 2015-09-13 DIAGNOSIS — I10 Essential (primary) hypertension: Secondary | ICD-10-CM | POA: Insufficient documentation

## 2015-09-13 DIAGNOSIS — E669 Obesity, unspecified: Secondary | ICD-10-CM | POA: Insufficient documentation

## 2015-09-13 DIAGNOSIS — Z880 Allergy status to penicillin: Secondary | ICD-10-CM | POA: Insufficient documentation

## 2015-09-13 DIAGNOSIS — E785 Hyperlipidemia, unspecified: Secondary | ICD-10-CM | POA: Diagnosis not present

## 2015-09-13 DIAGNOSIS — I4891 Unspecified atrial fibrillation: Secondary | ICD-10-CM | POA: Diagnosis not present

## 2015-09-13 DIAGNOSIS — I480 Paroxysmal atrial fibrillation: Secondary | ICD-10-CM | POA: Insufficient documentation

## 2015-09-13 DIAGNOSIS — E039 Hypothyroidism, unspecified: Secondary | ICD-10-CM | POA: Diagnosis not present

## 2015-09-13 DIAGNOSIS — Z87891 Personal history of nicotine dependence: Secondary | ICD-10-CM | POA: Insufficient documentation

## 2015-09-13 DIAGNOSIS — Z6837 Body mass index (BMI) 37.0-37.9, adult: Secondary | ICD-10-CM | POA: Diagnosis not present

## 2015-09-13 HISTORY — PX: CARDIOVERSION: SHX1299

## 2015-09-13 SURGERY — CARDIOVERSION
Anesthesia: General

## 2015-09-13 MED ORDER — PROPOFOL 10 MG/ML IV BOLUS
INTRAVENOUS | Status: DC | PRN
  Administered 2015-09-13: 70 mg via INTRAVENOUS

## 2015-09-13 MED ORDER — SODIUM CHLORIDE 0.9 % IV SOLN
INTRAVENOUS | Status: DC | PRN
  Administered 2015-09-13: 12:00:00 via INTRAVENOUS

## 2015-09-13 MED ORDER — SODIUM CHLORIDE 0.9 % IV SOLN: INTRAVENOUS | Status: DC

## 2015-09-13 MED ORDER — LIDOCAINE HCL (CARDIAC) 20 MG/ML IV SOLN
INTRAVENOUS | Status: DC | PRN
  Administered 2015-09-13: 20 mg via INTRAVENOUS

## 2015-09-13 NOTE — Discharge Instructions (Signed)
Electrical Cardioversion, Care After °Refer to this sheet in the next few weeks. These instructions provide you with information on caring for yourself after your procedure. Your health care provider may also give you more specific instructions. Your treatment has been planned according to current medical practices, but problems sometimes occur. Call your health care provider if you have any problems or questions after your procedure. °WHAT TO EXPECT AFTER THE PROCEDURE °After your procedure, it is typical to have the following sensations: °· Some redness on the skin where the shocks were delivered. If this is tender, a sunburn lotion or hydrocortisone cream may help. °· Possible return of an abnormal heart rhythm within hours or days after the procedure. °HOME CARE INSTRUCTIONS °· Take medicines only as directed by your health care provider. Be sure you understand how and when to take your medicine. °· Learn how to feel your pulse and check it often. °· Limit your activity for 48 hours after the procedure or as directed by your health care provider. °· Avoid or minimize caffeine and other stimulants as directed by your health care provider. °SEEK MEDICAL CARE IF: °· You feel like your heart is beating too fast or your pulse is not regular. °· You have any questions about your medicines. °· You have bleeding that will not stop. °SEEK IMMEDIATE MEDICAL CARE IF: °· You are dizzy or feel faint. °· It is hard to breathe or you feel short of breath. °· There is a change in discomfort in your chest. °· Your speech is slurred or you have trouble moving an arm or leg on one side of your body. °· You get a serious muscle cramp that does not go away. °· Your fingers or toes turn cold or blue. °  °This information is not intended to replace advice given to you by your health care provider. Make sure you discuss any questions you have with your health care provider. °  °Document Released: 06/21/2013 Document Revised: 09/21/2014  Document Reviewed: 06/21/2013 °Elsevier Interactive Patient Education ©2016 [employer] ° °

## 2015-09-13 NOTE — CV Procedure (Signed)
    Cardioversion Note  Carrie Potter [id number] 25-Nov-1950  Procedure: DC Cardioversion Indications: atrial fibrillation  Procedure Details Consent: Obtained Time Out: Verified patient identification, verified procedure, site/side was marked, verified correct patient position, special equipment/implants available, Radiology Safety Procedures followed,  medications/allergies/relevent history reviewed, required imaging and test results available.  Performed  The patient has been on adequate anticoagulation.  The patient received IV Propofol administered by the anesthesia staff for sedation.  Synchronous cardioversion was performed at 120 joules.  The cardioversion was succsessful.   Complications: No apparent complications Patient did tolerate procedure well.   Carrie Spark, MD, [facility] 09/13/2015, 11:30 AM

## 2015-09-13 NOTE — Anesthesia Preprocedure Evaluation (Signed)
Anesthesia Evaluation  Patient identified by MRN, date of birth, ID band Patient awake    Reviewed: Allergy & Precautions, NPO status , Patient's Chart, lab work & pertinent test results  Airway Mallampati: II  TM Distance: >3 FB Neck ROM: Full    Dental  (+) Missing,    Pulmonary shortness of breath, asthma , pneumonia, former smoker,    breath sounds clear to auscultation       Cardiovascular Exercise Tolerance: Poor hypertension, + dysrhythmias Atrial Fibrillation  Rhythm:Irregular Rate:Normal     Neuro/Psych    GI/Hepatic negative GI ROS, Neg liver ROS,   Endo/Other  Hypothyroidism Morbid obesity  Renal/GU negative Renal ROS     Musculoskeletal   Abdominal (+) + obese,  Abdomen: soft. Bowel sounds: normal.  Peds  Hematology   Anesthesia Other Findings   Reproductive/Obstetrics                             Anesthesia Physical Anesthesia Plan  ASA: III  Anesthesia Plan: General   Post-op Pain Management:    Induction: Intravenous  Airway Management Planned: Natural Airway and Simple Face Mask  Additional Equipment:   Intra-op Plan:   Post-operative Plan:   Informed Consent: I have reviewed the patients History and Physical, chart, labs and discussed the procedure including the risks, benefits and alternatives for the proposed anesthesia with the patient or authorized representative who has indicated his/her understanding and acceptance.     Plan Discussed with: CRNA and Surgeon  Anesthesia Plan Comments:         Anesthesia Quick Evaluation

## 2015-09-13 NOTE — Transfer of Care (Signed)
Immediate Anesthesia Transfer of Care Note  Patient: Carrie Potter  Procedure(s) Performed: Procedure(s): CARDIOVERSION (N/A)  Patient Location: PACU and Endoscopy Unit  Anesthesia Type:MAC  Level of Consciousness: awake, alert  and oriented  Airway & Oxygen Therapy: Patient Spontanous Breathing and Patient connected to nasal cannula oxygen  Post-op Assessment: Report given to RN and Post -op Vital signs reviewed and stable  Post vital signs: Reviewed and stable  Last Vitals:  Filed Vitals:   09/13/15 1059 09/13/15 1229  BP: 168/104 118/51  Pulse: 123 55  Temp: 36.8 C 36.6 C  Resp: 15 18    Complications: No apparent anesthesia complications

## 2015-09-13 NOTE — Anesthesia Postprocedure Evaluation (Signed)
Anesthesia Post Note  Patient: Carrie Potter  Procedure(s) Performed: Procedure(s) (LRB): CARDIOVERSION (N/A)  Patient location during evaluation: PACU Anesthesia Type: General Level of consciousness: awake and alert Pain management: pain level controlled Vital Signs Assessment: post-procedure vital signs reviewed and stable Respiratory status: spontaneous breathing, nonlabored ventilation, respiratory function stable and patient connected to nasal cannula oxygen Cardiovascular status: blood pressure returned to baseline and stable Postop Assessment: no signs of nausea or vomiting Anesthetic complications: no    Last Vitals:  Filed Vitals:   09/13/15 1059 09/13/15 1229  BP: 168/104 118/51  Pulse: 123 55  Temp: 36.8 C 36.6 C  Resp: 15 18    Last Pain: There were no vitals filed for this visit.               Bernetha Anschutz,JAMES TERRILL

## 2015-09-13 NOTE — H&P (View-Only) (Signed)
Cardiology Office Note   Date:  09/10/2015   ID:  ETHNE Potter, DOB 18-Mar-1951, MRN [id number]  PCP:  Antony Blackbird, MD  Cardiologist:  Dr. Marlou Porch    Chief Complaint  Patient presents with  . Tachycardia    HR  100-110       History of Present Illness: Carrie Potter is a 64 y.o. female who presents for post cardioversion follow up.   She has a hx. Of paroxysmal atrial fibrillation follow-up post ablation 07/12/15. Metoprolol was held given sinus bradycardia post ablation, post termination pauses. She was continued on Tikosyn and Xarelto. Previously she had failed Tikosyn in restoring sinus rhythm.  After ablation, she did return to sinus rhythm for about a week but she continued on a fib and was scheduled for DCCV 08/14/15 with 1 shock at 120 J, initially she returned to SB at 28 then to 52.  She is back today for follow-up.    Today she is back in a fib.  She states she was in normal rhythm for 3 days then back to a fib.  No chest pain, + DOE but tolerable most times.  Today her Qtc is prolonged.   Past Medical History  Diagnosis Date  . HTN (hypertension)   . Asthma   . Hyperlipidemia   . Hypothyroidism   . Obesity   . Palpitation   . Atrial fibrillation ([place])   . Walking pneumonia ~ 10/2014  . Dysrhythmia     atrial fibrillation    Past Surgical History  Procedure Laterality Date  . Cardioversion  03/08/2012    Procedure: CARDIOVERSION;  Surgeon: Candee Furbish, MD;  Location: [facility];  Service: Cardiovascular;  Laterality: N/A;  . Tonsillectomy    . Dilation and curettage of uterus    . Abdominal hysterectomy    . Tubal ligation    . Fracture surgery    . Ankle fracture surgery Right ?1999  . Cardioversion N/A 05/02/2015    Procedure: CARDIOVERSION;  Surgeon: Jerline Pain, MD;  Location: [place];  Service: Cardiovascular;  Laterality: N/A;  . Cardioversion N/A 05/16/2015    Procedure: CARDIOVERSION;  Surgeon: Sueanne Margarita, MD;  [facility] ENDOSCOPY;   Service: Cardiovascular;  Laterality: N/A;  . Tee without cardioversion N/A 07/11/2015    Procedure: TRANSESOPHAGEAL ECHOCARDIOGRAM (TEE);  Surgeon: Skeet Latch, MD;  Location: [place];  Service: Cardiovascular;  Laterality: N/A;  . Electrophysiologic study N/A 07/12/2015    Procedure: Atrial Fibrillation Ablation;  Surgeon: Thompson Grayer, MD;  Location: [place] CV LAB;  Service: Cardiovascular;  Laterality: N/A;  . Cardioversion N/A 08/14/2015    Procedure: CARDIOVERSION;  Surgeon: Josue Hector, MD;  Location: [facility] ENDOSCOPY;  Service: Cardiovascular;  Laterality: N/A;     Current Outpatient Prescriptions  Medication Sig Dispense Refill  . acetaminophen (TYLENOL) 500 MG tablet Take 500-1,000 mg by mouth every 6 (six) hours as needed for mild pain or moderate pain.     Marland Kitchen albuterol (PROVENTIL HFA;VENTOLIN HFA) 108 (90 BASE) MCG/ACT inhaler Inhale 2 puffs into the lungs every 4 (four) hours as needed for shortness of breath.     . levothyroxine (SYNTHROID, LEVOTHROID) 125 MCG tablet Take 125 mcg by mouth daily.    . Multiple Vitamin (MULTIVITAMIN) tablet Take 2 tablets by mouth daily. Women's Gummies    . pantoprazole (PROTONIX) 40 MG tablet TAKE 1 TABLET (40 MG TOTAL) BY MOUTH DAILY. 45 tablet 11  . Probiotic Product (PROBIOTIC PO) Take 1 tablet  by mouth daily.    . rivaroxaban (XARELTO) 20 MG TABS tablet Take 1 tablet (20 mg total) by mouth daily. 30 tablet 11  . spironolactone (ALDACTONE) 25 MG tablet Take 0.5 tablets (12.5 mg total) by mouth daily. 45 tablet 3  . tolterodine (DETROL) 2 MG tablet Take 2 mg by mouth daily.    . triazolam (HALCION) 0.25 MG tablet Take 0.25 mg by mouth as directed. Take before Dental appts.  0  . valACYclovir (VALTREX) 1000 MG tablet Take 1,000 mg by mouth 2 (two) times daily as needed. Cold sore    . dofetilide (TIKOSYN) 125 MCG capsule Take 3 capsules (375 mcg total) by mouth 2 (two) times daily. 90 capsule 1   No current facility-administered  medications for this visit.    Allergies:   Lisinopril; Penicillins; and Diltiazem    Social History:  The patient  reports that she has quit smoking. Her smoking use included Cigarettes. She has a 3 pack-year smoking history. She has never used smokeless tobacco. She reports that she drinks about 2.4 oz of alcohol per week. She reports that she does not use illicit drugs.   Family History:  The patient's family history includes Breast cancer in her mother; COPD in her father; Cancer in her father and mother; Heart disease in her father; Hypertension in her mother. There is no history of Heart attack or Stroke.    ROS:  General:no colds or fevers, no weight changes Skin:no rashes or ulcers HEENT:no blurred vision, no congestion CV:see HPI PUL:see HPI GI:no diarrhea constipation or melena, no indigestion GU:no hematuria, no dysuria MS:no joint pain, no claudication Neuro:no syncope, no lightheadedness Endo:no diabetes, + thyroid disease  Wt Readings from Last 3 Encounters:  09/10/15 223 lb (101.152 kg)  08/12/15 225 lb (102.059 kg)  07/25/15 232 lb 6.4 oz (105.416 kg)     PHYSICAL EXAM: VS:  BP 140/70 mmHg  Pulse 109  Ht 5\' 5"  (1.651 m)  Wt 223 lb (101.152 kg)  BMI 37.11 kg/m2 , BMI Body mass index is 37.11 kg/(m^2). General:Pleasant affect, NAD Skin:Warm and dry, brisk capillary refill HEENT:normocephalic, sclera clear, mucus membranes moist Neck:supple, no JVD, no bruits  Heart:irreg irreg without murmur, gallup, rub or click Lungs:clear without rales, rhonchi, or wheezes [id number], soft, non tender, + BS, do not palpate liver spleen or masses Ext:no lower ext edema, 2+ pedal pulses, 2+ radial pulses Neuro:alert and oriented, X 3, MAE, follows commands, + facial symmetry    EKG:  EKG is ordered today. The ekg ordered today demonstrates A fib rate 107 new from 08/14/15.    Recent Labs: 04/17/2015: TSH 0.857 06/11/2015: Magnesium 2.0 08/12/2015: BUN 18; Creat 0.74;  Hemoglobin 15.3*; Platelets 238; Potassium 4.5; Sodium 139    Lipid Panel No results found for: CHOL, TRIG, HDL, CHOLHDL, VLDL, LDLCALC, LDLDIRECT     Other studies Reviewed: Additional studies/ records that were reviewed today include: review of DCCV. EKG SB at 52. .   ASSESSMENT AND PLAN:  1.  PAF/persistent- SB lasted 3 days.  Now back in a fib rate 100-110 or so, but asymptomatic except DOE.  No chest pain.  Her Qtc 25  -discussed with Dr. Curt Bears  DOD  And will send note to Dr. Rayann Heman.  With prolonged Qtc will decrease tikosyn to 375 BID-  3 of 125 mcg BID. Plan DCCV next Tuesday.  Will check labs as well on Thursday and repeat EKG.   2. Chronic anticoagulation-she has not missed any doses  of Xarelto.  3. Obesity-continue to encourage weight loss   Current medicines are reviewed with the patient today.  The patient Has no concerns regarding medicines.  The following changes have been made:  See above Labs/ tests ordered today include:see above  Disposition:   FU:  see above  Signed, Isaiah Serge, NP  09/10/2015 11:16 AM    [facility] [address], [place], [employer] [place], [place] Phone: [phone]; Fax: [phone]

## 2015-09-13 NOTE — Interval H&P Note (Signed)
History and Physical Interval Note:  09/13/2015 11:30 AM  Carrie Potter  has presented today for surgery, with the diagnosis of A FIB   The various methods of treatment have been discussed with the patient and family. After consideration of risks, benefits and other options for treatment, the patient has consented to  Procedure(s): CARDIOVERSION (N/A) as a surgical intervention .  The patient's history has been reviewed, patient examined, no change in status, stable for surgery.  I have reviewed the patient's chart and labs.  Questions were answered to the patient's satisfaction.     Dorothy Spark

## 2015-09-17 ENCOUNTER — Encounter (HOSPITAL_COMMUNITY): Payer: Self-pay | Admitting: Cardiology

## 2015-09-18 ENCOUNTER — Telehealth: Payer: Self-pay | Admitting: Internal Medicine

## 2015-09-18 ENCOUNTER — Other Ambulatory Visit (INDEPENDENT_AMBULATORY_CARE_PROVIDER_SITE_OTHER): Payer: BC Managed Care – PPO | Admitting: *Deleted

## 2015-09-18 DIAGNOSIS — R002 Palpitations: Secondary | ICD-10-CM

## 2015-09-18 DIAGNOSIS — I480 Paroxysmal atrial fibrillation: Secondary | ICD-10-CM

## 2015-09-18 LAB — MAGNESIUM: MAGNESIUM: 2 mg/dL (ref 1.5–2.5)

## 2015-09-18 MED ORDER — MAGNESIUM OXIDE 400 MG PO TABS: 400.0000 mg | ORAL_TABLET | Freq: Every day | ORAL | Status: DC

## 2015-09-18 NOTE — Addendum Note (Signed)
Addended by: Eulis Foster on: 09/18/2015 09:55 AM   Modules accepted: Orders

## 2015-09-18 NOTE — Telephone Encounter (Signed)
Spoke with patient and she had her labs done last week and her Mag was low and to repeat labs today.  Now she is to take once daily.  New rx called in and she will keep her follow up as scheduled

## 2015-09-18 NOTE — Telephone Encounter (Signed)
NEw MEssage  Pt stated that she was waiting on MAg lab results from today to see if she needs to still take magnesium- pt ran out this morning. Please call back and discuss.

## 2015-09-20 ENCOUNTER — Encounter (HOSPITAL_COMMUNITY): Payer: Self-pay | Admitting: Nurse Practitioner

## 2015-09-20 ENCOUNTER — Telehealth: Payer: Self-pay | Admitting: *Deleted

## 2015-09-20 ENCOUNTER — Ambulatory Visit (HOSPITAL_COMMUNITY)
Admission: RE | Admit: 2015-09-20 | Discharge: 2015-09-20 | Disposition: A | Discharge status: Home / Self Care | Payer: BC Managed Care – PPO | Source: Ambulatory Visit | Attending: Nurse Practitioner | Admitting: Nurse Practitioner

## 2015-09-20 VITALS — BP 140/88 | HR 55 | Ht 65.0 in | Wt 221.2 lb

## 2015-09-20 DIAGNOSIS — I481 Persistent atrial fibrillation: Secondary | ICD-10-CM | POA: Insufficient documentation

## 2015-09-20 DIAGNOSIS — I10 Essential (primary) hypertension: Secondary | ICD-10-CM | POA: Insufficient documentation

## 2015-09-20 DIAGNOSIS — E039 Hypothyroidism, unspecified: Secondary | ICD-10-CM | POA: Insufficient documentation

## 2015-09-20 DIAGNOSIS — I4891 Unspecified atrial fibrillation: Secondary | ICD-10-CM

## 2015-09-20 DIAGNOSIS — E785 Hyperlipidemia, unspecified: Secondary | ICD-10-CM | POA: Diagnosis not present

## 2015-09-20 DIAGNOSIS — E669 Obesity, unspecified: Secondary | ICD-10-CM | POA: Insufficient documentation

## 2015-09-20 DIAGNOSIS — J45909 Unspecified asthma, uncomplicated: Secondary | ICD-10-CM | POA: Diagnosis not present

## 2015-09-20 DIAGNOSIS — Z6836 Body mass index (BMI) 36.0-36.9, adult: Secondary | ICD-10-CM | POA: Diagnosis not present

## 2015-09-20 DIAGNOSIS — Z79899 Other long term (current) drug therapy: Secondary | ICD-10-CM | POA: Insufficient documentation

## 2015-09-20 DIAGNOSIS — I4819 Other persistent atrial fibrillation: Secondary | ICD-10-CM

## 2015-09-20 DIAGNOSIS — Z7902 Long term (current) use of antithrombotics/antiplatelets: Secondary | ICD-10-CM | POA: Insufficient documentation

## 2015-09-20 NOTE — Progress Notes (Signed)
Patient ID: Carrie Potter, female   DOB: 05/31/1951, 65 y.o.   MRN: [id number]     Primary Care Physician: Antony Blackbird, MD Referring Physician: Dr. Albin Felling Carrie Potter is a 65 y.o. female with a h/o persistent afib,s/p ablation 10/28, to f/u in afib clinic,following second cardioversion, now in sinus brady. She continues on tikosyn now at 375 mg. Labs recently checked with mag at 1.7 and replaced. She is so happy to be in SR. Feels so much better. Has lost 11 lbs. Getting over chest congestion, no fever, taking muccinex plain.  Today, she denies symptoms of palpitations, chest pain, shortness of breath, orthopnea, PND, lower extremity edema, dizziness, presyncope, syncope, or neurologic sequela. The patient is tolerating medications without difficulties and is otherwise without complaint today.   Past Medical History  Diagnosis Date  . HTN (hypertension)   . Asthma   . Hyperlipidemia   . Hypothyroidism   . Obesity   . Palpitation   . Atrial fibrillation ([place])   . Walking pneumonia ~ 10/2014  . Dysrhythmia     atrial fibrillation   Past Surgical History  Procedure Laterality Date  . Cardioversion  03/08/2012    Procedure: CARDIOVERSION;  Surgeon: Candee Furbish, MD;  Location: [facility] OR;  Service: Cardiovascular;  Laterality: N/A;  . Tonsillectomy    . Dilation and curettage of uterus    . Abdominal hysterectomy    . Tubal ligation    . Fracture surgery    . Ankle fracture surgery Right ?1999  . Cardioversion N/A 05/02/2015    Procedure: CARDIOVERSION;  Surgeon: Jerline Pain, MD;  Location: [place];  Service: Cardiovascular;  Laterality: N/A;  . Cardioversion N/A 05/16/2015    Procedure: CARDIOVERSION;  Surgeon: Sueanne Margarita, MD;  Location: [facility] ENDOSCOPY;  Service: Cardiovascular;  Laterality: N/A;  . Tee without cardioversion N/A 07/11/2015    Procedure: TRANSESOPHAGEAL ECHOCARDIOGRAM (TEE);  Surgeon: Skeet Latch, MD;  Location: [place];  Service: Cardiovascular;   Laterality: N/A;  . Electrophysiologic study N/A 07/12/2015    Procedure: Atrial Fibrillation Ablation;  Surgeon: Thompson Grayer, MD;  Location: [place] CV LAB;  Service: Cardiovascular;  Laterality: N/A;  . Cardioversion N/A 08/14/2015    Procedure: CARDIOVERSION;  Surgeon: Josue Hector, MD;  Location: [facility] ENDOSCOPY;  Service: Cardiovascular;  Laterality: N/A;  . Cardioversion N/A 09/13/2015    Procedure: CARDIOVERSION;  Surgeon: Dorothy Spark, MD;  Location: [facility] ENDOSCOPY;  Service: Cardiovascular;  Laterality: N/A;    Current Outpatient Prescriptions  Medication Sig Dispense Refill  . acetaminophen (TYLENOL) 500 MG tablet Take 500-1,000 mg by mouth every 6 (six) hours as needed for mild pain or moderate pain.     Marland Kitchen albuterol (PROVENTIL HFA;VENTOLIN HFA) 108 (90 BASE) MCG/ACT inhaler Inhale 2 puffs into the lungs every 4 (four) hours as needed for shortness of breath.     . dofetilide (TIKOSYN) 125 MCG capsule Take 3 capsules (375 mcg total) by mouth 2 (two) times daily. 90 capsule 1  . guaiFENesin (MUCINEX) 600 MG 12 hr tablet Take 600 mg by mouth 2 (two) times daily as needed for cough or to loosen phlegm.    Marland Kitchen levothyroxine (SYNTHROID, LEVOTHROID) 125 MCG tablet Take 125 mcg by mouth daily.    . magnesium oxide (MAG-OX) 400 MG tablet Take 1 tablet (400 mg total) by mouth daily. 30 tablet 3  . Multiple Vitamin (MULTIVITAMIN) tablet Take 2 tablets by mouth daily. Women's Gummies    . pantoprazole (  PROTONIX) 40 MG tablet TAKE 1 TABLET (40 MG TOTAL) BY MOUTH DAILY. 45 tablet 11  . Probiotic Product (PROBIOTIC PO) Take 1 tablet by mouth daily.    . rivaroxaban (XARELTO) 20 MG TABS tablet Take 1 tablet (20 mg total) by mouth daily. 30 tablet 11  . spironolactone (ALDACTONE) 25 MG tablet Take 0.5 tablets (12.5 mg total) by mouth daily. 45 tablet 3  . tolterodine (DETROL) 2 MG tablet Take 2 mg by mouth daily.    . triazolam (HALCION) 0.25 MG tablet Take 0.25 mg by mouth as directed. Take  before Dental appts.  0  . valACYclovir (VALTREX) 1000 MG tablet Take 1,000 mg by mouth 2 (two) times daily as needed. Cold sore     No current facility-administered medications for this encounter.    Allergies  Allergen Reactions  . Lisinopril Swelling  . Penicillins Hives    Has patient had a PCN reaction causing immediate rash, facial/tongue/throat swelling, SOB or lightheadedness with hypotension: Yes Has patient had a PCN reaction causing severe rash involving mucus membranes or skin necrosis: No Has patient had a PCN reaction that required hospitalization No Has patient had a PCN reaction occurring within the last 10 years: No If all of the above answers are "NO", then may proceed with Cephalosporin use.  . Diltiazem Rash    Social History   Social History  . Marital Status: Married    Spouse Name: N/A  . Number of Children: N/A  . Years of Education: N/A   Occupational History  . Not on file.   Social History Main Topics  . Smoking status: Former Smoker -- 1.00 packs/day for 3 years    Types: Cigarettes  . Smokeless tobacco: Never Used     Comment: "quit smoking ~ 1976"  . Alcohol Use: 2.4 oz/week    4 Glasses of wine per week  . Drug Use: No  . Sexual Activity: Yes   Other Topics Concern  . Not on file   Social History Narrative    Family History  Problem Relation Age of Onset  . COPD Father   . Heart disease Father   . Cancer Father     skin  . Breast cancer Mother   . Cancer Mother     skin  . Hypertension Mother   . Heart attack Neg Hx   . Stroke Neg Hx     ROS- All systems are reviewed and negative except as per the HPI above  Physical Exam: Filed Vitals:   09/20/15 0851 09/20/15 0927  BP: 158/76 140/88  Pulse: 55   Height: 5\' 5"  (1.651 m)   Weight: 221 lb 3.2 oz (100.336 kg)     GEN- The patient is well appearing, alert and oriented x 3 today.   Head- normocephalic, atraumatic Eyes-  Sclera clear, conjunctiva pink Ears- hearing  intact Oropharynx- clear Neck- supple, no JVP Lymph- no cervical lymphadenopathy Lungs- Clear to ausculation bilaterally, normal work of breathing Heart- Regular rate and rhythm, no murmurs, rubs or gallops, PMI not laterally displaced GI- soft, NT, ND, + BS Extremities- no clubbing, cyanosis, or edema MS- no significant deformity or atrophy Skin- no rash or lesion Psych- euthymic mood, full affect Neuro- strength and sensation are intact  EKG- Sinus brady at 55 bpm, pr int 186 ms, qrs int 78 ms, qtc 474. Epic records reviewed  Last mag at 2.0,1/4, K+ 11/28 at 4.5  Assessment and Plan: 1. Persistent afib Now back in sinus rhythm  following tikosyn, ablation and two cardioversion's Continue tikosyn 375 mg bid Continue xarelto  2. HTN stable   F/u with Dr. Rayann Heman 1/25 afib clinic as needed

## 2015-09-20 NOTE — Telephone Encounter (Signed)
Called pt, per Cecilie Kicks, NP to advise her her Mag level is ok and it was fine to take Magnesium once a day.  Pt verbalized understanding.

## 2015-09-20 NOTE — Telephone Encounter (Signed)
Follow Up ° °Pt returned call for labs °

## 2015-09-20 NOTE — Telephone Encounter (Signed)
-----   Message from Isaiah Serge, NP sent at 09/18/2015  4:32 PM EST ----- Ok to take magnesium just once a day.

## 2015-09-20 NOTE — Telephone Encounter (Signed)
Patient aware of her results 

## 2015-09-26 ENCOUNTER — Telehealth: Payer: Self-pay | Admitting: Cardiology

## 2015-09-26 ENCOUNTER — Other Ambulatory Visit: Payer: Self-pay

## 2015-09-26 MED ORDER — DOFETILIDE 125 MCG PO CAPS: 375.0000 ug | ORAL_CAPSULE | Freq: Two times a day (BID) | ORAL | Status: DC

## 2015-09-26 NOTE — Telephone Encounter (Signed)
Pt calling stating that she needs a prior auth on her medication Tikosyn 125 mcg. Pt states that she is out of her medication. Please advise

## 2015-09-26 NOTE — Telephone Encounter (Signed)
Prior auth for generic Tikosyn 125 mcg sent to [facility].

## 2015-09-26 NOTE — Telephone Encounter (Signed)
PA for tikosyn in process - was sent to [facility] for completion. Will be in contact with patient once received.

## 2015-09-27 ENCOUNTER — Telehealth (HOSPITAL_COMMUNITY): Payer: Self-pay | Admitting: *Deleted

## 2015-09-27 NOTE — Telephone Encounter (Signed)
PA for Tikosyn [phone] Authorized through 09-26-2016.

## 2015-09-30 ENCOUNTER — Ambulatory Visit: Payer: BC Managed Care – PPO | Admitting: Internal Medicine

## 2015-09-30 ENCOUNTER — Telehealth: Payer: Self-pay

## 2015-09-30 NOTE — Telephone Encounter (Signed)
Dofetilide approved by [facility] through 09/26/2016.

## 2015-10-09 ENCOUNTER — Ambulatory Visit (INDEPENDENT_AMBULATORY_CARE_PROVIDER_SITE_OTHER): Payer: BC Managed Care – PPO | Admitting: Internal Medicine

## 2015-10-09 ENCOUNTER — Encounter: Payer: Self-pay | Admitting: Internal Medicine

## 2015-10-09 VITALS — BP 138/78 | HR 62 | Ht 64.0 in | Wt 213.2 lb

## 2015-10-09 DIAGNOSIS — I10 Essential (primary) hypertension: Secondary | ICD-10-CM

## 2015-10-09 DIAGNOSIS — E669 Obesity, unspecified: Secondary | ICD-10-CM

## 2015-10-09 DIAGNOSIS — I481 Persistent atrial fibrillation: Secondary | ICD-10-CM

## 2015-10-09 DIAGNOSIS — I4819 Other persistent atrial fibrillation: Secondary | ICD-10-CM

## 2015-10-09 NOTE — Progress Notes (Signed)
PCP: Antony Blackbird, MD Primary Cardiologist:  Dr Hilary Hertz is a 65 y.o. female who presents today for routine electrophysiology followup.  Since her recent afib ablation, the patient reports doing very well.  She did have ERAF requiring cardioversion x 2.  She is now maintaining sinus rhythm and is very happy with her current condition.  Denies procedure related complications.  Today, she denies symptoms of palpitations, chest pain, shortness of breath,  lower extremity edema, dizziness, presyncope, or syncope.  The patient is otherwise without complaint today.   Past Medical History  Diagnosis Date  . HTN (hypertension)   . Asthma   . Hyperlipidemia   . Hypothyroidism   . Obesity   . Palpitation   . Atrial fibrillation ([place])   . Walking pneumonia ~ 10/2014  . Dysrhythmia     atrial fibrillation   Past Surgical History  Procedure Laterality Date  . Cardioversion  03/08/2012    Procedure: CARDIOVERSION;  Surgeon: Candee Furbish, MD;  Location: [facility] OR;  Service: Cardiovascular;  Laterality: N/A;  . Tonsillectomy    . Dilation and curettage of uterus    . Abdominal hysterectomy    . Tubal ligation    . Fracture surgery    . Ankle fracture surgery Right ?1999  . Cardioversion N/A 05/02/2015    Procedure: CARDIOVERSION;  Surgeon: Jerline Pain, MD;  Location: [place];  Service: Cardiovascular;  Laterality: N/A;  . Cardioversion N/A 05/16/2015    Procedure: CARDIOVERSION;  Surgeon: Sueanne Margarita, MD;  Location: [facility] ENDOSCOPY;  Service: Cardiovascular;  Laterality: N/A;  . Tee without cardioversion N/A 07/11/2015    Procedure: TRANSESOPHAGEAL ECHOCARDIOGRAM (TEE);  Surgeon: Skeet Latch, MD;  Location: [place];  Service: Cardiovascular;  Laterality: N/A;  . Electrophysiologic study N/A 07/12/2015    Procedure: Atrial Fibrillation Ablation;  Surgeon: Thompson Grayer, MD;  Location: [place] CV LAB;  Service: Cardiovascular;  Laterality: N/A;  . Cardioversion N/A  08/14/2015    Procedure: CARDIOVERSION;  Surgeon: Josue Hector, MD;  Location: [facility] ENDOSCOPY;  Service: Cardiovascular;  Laterality: N/A;  . Cardioversion N/A 09/13/2015    Procedure: CARDIOVERSION;  Surgeon: Dorothy Spark, MD;  Location: [facility] ENDOSCOPY;  Service: Cardiovascular;  Laterality: N/A;    ROS- all systems are reviewed and negatives except as per HPI above  Current Outpatient Prescriptions  Medication Sig Dispense Refill  . acetaminophen (TYLENOL) 500 MG tablet Take 500-1,000 mg by mouth every 6 (six) hours as needed for mild pain or moderate pain.     Marland Kitchen albuterol (PROVENTIL HFA;VENTOLIN HFA) 108 (90 BASE) MCG/ACT inhaler Inhale 2 puffs into the lungs every 4 (four) hours as needed for shortness of breath.     . dofetilide (TIKOSYN) 125 MCG capsule Take 3 capsules (375 mcg total) by mouth 2 (two) times daily. 180 capsule 1  . guaiFENesin (MUCINEX) 600 MG 12 hr tablet Take 600 mg by mouth 2 (two) times daily as needed for cough or to loosen phlegm.    Marland Kitchen levothyroxine (SYNTHROID, LEVOTHROID) 125 MCG tablet Take 125 mcg by mouth daily.    . magnesium oxide (MAG-OX) 400 MG tablet Take 1 tablet (400 mg total) by mouth daily. 30 tablet 3  . Multiple Vitamin (MULTIVITAMIN) tablet Take 2 tablets by mouth daily. Women's Gummies    . Probiotic Product (PROBIOTIC PO) Take 1 tablet by mouth daily.    . rivaroxaban (XARELTO) 20 MG TABS tablet Take 1 tablet (20 mg total) by mouth daily. [place]  tablet 11  . spironolactone (ALDACTONE) 25 MG tablet Take 0.5 tablets (12.5 mg total) by mouth daily. 45 tablet 3  . tolterodine (DETROL) 2 MG tablet Take 2 mg by mouth daily.    . triazolam (HALCION) 0.25 MG tablet Take 0.25 mg by mouth as directed. Take before Dental appts.  0  . valACYclovir (VALTREX) 1000 MG tablet Take 1,000 mg by mouth 2 (two) times daily as needed. Cold sore     No current facility-administered medications for this visit.    Physical Exam: Filed Vitals:   10/09/15 1444  BP:  138/78  Pulse: 62  Height: 5\' 4"  (1.626 m)  Weight: 213 lb 3.2 oz (96.707 kg)    GEN- The patient is well appearing, alert and oriented x 3 today.   Head- normocephalic, atraumatic Eyes-  Sclera clear, conjunctiva pink Ears- hearing intact Oropharynx- clear Lungs- Clear to ausculation bilaterally, normal work of breathing Heart- Regular rate and rhythm, no murmurs, rubs or gallops, PMI not laterally displaced GI- soft, NT, ND, + BS Extremities- no clubbing, cyanosis, or edema  ekg today reveals sinus rhythm with pacs, Qtc 484 msec  Assessment and Plan:  1.  Persistent atrial fibrillation Doing well s/p ablation She has a chads2vasc score of at least 2.  She is on xarelto Continue current medical therapy Would be reluctant to stop tikosyn at this point but may reconsider if no further AF in 3 months  2. HTN Stable No change required today  3. Obesity Weight loss advised She is making real progress with this!  4. Sinus bradycardia Asymptomatic presently Caution with rate control  Return to see me in 3 months  Thompson Grayer MD, [facility] 10/09/2015 3:28 PM

## 2015-10-09 NOTE — Patient Instructions (Addendum)
Medication Instructions:  Your physician has recommended you make the following change in your medication:  1) Stop Protonix     Labwork: None ordered   Testing/Procedures: None ordered   Follow-Up: Your physician recommends that you schedule a follow-up appointment in: 3 months with Dr Rayann Heman   Any Other Special Instructions Will Be Listed Below (If Applicable).     If you need a refill on your cardiac medications before your next appointment, please call your pharmacy.

## 2015-10-30 ENCOUNTER — Ambulatory Visit (HOSPITAL_COMMUNITY)
Admission: RE | Admit: 2015-10-30 | Discharge: 2015-10-30 | Disposition: A | Discharge status: Home / Self Care | Payer: BC Managed Care – PPO | Source: Ambulatory Visit | Attending: Nurse Practitioner | Admitting: Nurse Practitioner

## 2015-10-30 DIAGNOSIS — I481 Persistent atrial fibrillation: Secondary | ICD-10-CM | POA: Diagnosis not present

## 2015-10-30 DIAGNOSIS — I491 Atrial premature depolarization: Secondary | ICD-10-CM | POA: Insufficient documentation

## 2015-10-30 DIAGNOSIS — I4891 Unspecified atrial fibrillation: Secondary | ICD-10-CM | POA: Diagnosis present

## 2015-10-30 NOTE — Progress Notes (Addendum)
Patient felt she was in afib around 2am this morning and wanted to be checked. EKG to be reviewed by Roderic Palau NP  Pt's EKG shows NSR with stable qtc. She woke up in afib around 2am, but went back to sleep. Woke up again at 4a, rate felt slower, converted before her 6 am tikosyn,  She states that she ate a bigger meal than usual for valentines and had one glass of wine, which she usually does not drink. She thinks this may have been the trigger.

## 2015-11-07 ENCOUNTER — Other Ambulatory Visit: Payer: Self-pay

## 2015-11-07 DIAGNOSIS — Z1231 Encounter for screening mammogram for malignant neoplasm of breast: Secondary | ICD-10-CM

## 2015-11-15 ENCOUNTER — Other Ambulatory Visit: Payer: Self-pay | Admitting: Nurse Practitioner

## 2015-11-19 ENCOUNTER — Ambulatory Visit
Admission: RE | Admit: 2015-11-19 | Discharge: 2015-11-19 | Disposition: A | Discharge status: Home / Self Care | Payer: BC Managed Care – PPO | Source: Ambulatory Visit

## 2015-11-19 DIAGNOSIS — Z1231 Encounter for screening mammogram for malignant neoplasm of breast: Secondary | ICD-10-CM

## 2015-12-18 ENCOUNTER — Ambulatory Visit (HOSPITAL_COMMUNITY)
Admission: RE | Admit: 2015-12-18 | Discharge: 2015-12-18 | Disposition: A | Discharge status: Home / Self Care | Payer: BC Managed Care – PPO | Source: Ambulatory Visit | Attending: Nurse Practitioner | Admitting: Nurse Practitioner

## 2015-12-18 ENCOUNTER — Telehealth: Payer: Self-pay | Admitting: Internal Medicine

## 2015-12-18 ENCOUNTER — Encounter (HOSPITAL_COMMUNITY): Payer: Self-pay | Admitting: Nurse Practitioner

## 2015-12-18 VITALS — BP 120/62 | HR 53 | Ht 64.0 in | Wt 200.0 lb

## 2015-12-18 DIAGNOSIS — Z880 Allergy status to penicillin: Secondary | ICD-10-CM | POA: Diagnosis not present

## 2015-12-18 DIAGNOSIS — Z8679 Personal history of other diseases of the circulatory system: Secondary | ICD-10-CM | POA: Diagnosis not present

## 2015-12-18 DIAGNOSIS — Z7901 Long term (current) use of anticoagulants: Secondary | ICD-10-CM | POA: Insufficient documentation

## 2015-12-18 DIAGNOSIS — J45909 Unspecified asthma, uncomplicated: Secondary | ICD-10-CM | POA: Diagnosis not present

## 2015-12-18 DIAGNOSIS — Z9889 Other specified postprocedural states: Secondary | ICD-10-CM

## 2015-12-18 DIAGNOSIS — Z8249 Family history of ischemic heart disease and other diseases of the circulatory system: Secondary | ICD-10-CM | POA: Diagnosis not present

## 2015-12-18 DIAGNOSIS — E785 Hyperlipidemia, unspecified: Secondary | ICD-10-CM | POA: Diagnosis not present

## 2015-12-18 DIAGNOSIS — Z888 Allergy status to other drugs, medicaments and biological substances status: Secondary | ICD-10-CM | POA: Insufficient documentation

## 2015-12-18 DIAGNOSIS — E039 Hypothyroidism, unspecified: Secondary | ICD-10-CM | POA: Insufficient documentation

## 2015-12-18 DIAGNOSIS — I481 Persistent atrial fibrillation: Secondary | ICD-10-CM | POA: Diagnosis not present

## 2015-12-18 DIAGNOSIS — Z87891 Personal history of nicotine dependence: Secondary | ICD-10-CM | POA: Insufficient documentation

## 2015-12-18 DIAGNOSIS — Z6834 Body mass index (BMI) 34.0-34.9, adult: Secondary | ICD-10-CM | POA: Insufficient documentation

## 2015-12-18 DIAGNOSIS — Z79899 Other long term (current) drug therapy: Secondary | ICD-10-CM | POA: Insufficient documentation

## 2015-12-18 DIAGNOSIS — E669 Obesity, unspecified: Secondary | ICD-10-CM | POA: Insufficient documentation

## 2015-12-18 DIAGNOSIS — I4891 Unspecified atrial fibrillation: Secondary | ICD-10-CM | POA: Diagnosis present

## 2015-12-18 DIAGNOSIS — Z825 Family history of asthma and other chronic lower respiratory diseases: Secondary | ICD-10-CM | POA: Insufficient documentation

## 2015-12-18 NOTE — Telephone Encounter (Signed)
New message  Pt req same day appt  High heart rate, recent ablation. Feels like she is in afib. Heart rate is down from earlier this am. It may appear that she is not in it now but she is fairly concerned.  114-120 bouncing back and forth its down to 60 now. It happened on 2/15 seen Carrie Potter. An ECHo was completed. Pt request a call back to discuss.

## 2015-12-18 NOTE — Progress Notes (Signed)
Patient ID: Carrie Potter, female   DOB: Mar 08, 1951, 65 y.o.   MRN: [id number]     Primary Care Physician: Antony Blackbird, MD Referring Physician: Dr. Etheleen Sia is a 65 y.o. female with a h/o afib ablation on tikosyn tht has been doing well with very few episodes of afib and when she has had an episode, it is short lived. Ususall occurs in early am and takes her meds and then resumes SR. She had a similar episode this am and felt nervous in her chest after she returned to [place] and wanted to be checked. She and her husband fly out to [place] for the weekend tomorrow and wanted to be checked. She is in Sinus brady with Pac's with acceptable qtc. She has lost an additional 13 lbs!!  Today, she denies symptoms of palpitations, chest pain, shortness of breath, orthopnea, PND, lower extremity edema, dizziness, presyncope, syncope, or neurologic sequela. The patient is tolerating medications without difficulties and is otherwise without complaint today.   Past Medical History  Diagnosis Date  . HTN (hypertension)   . Asthma   . Hyperlipidemia   . Hypothyroidism   . Obesity   . Palpitation   . Atrial fibrillation ([place])   . Walking pneumonia ~ 10/2014  . Dysrhythmia     atrial fibrillation   Past Surgical History  Procedure Laterality Date  . Cardioversion  03/08/2012    Procedure: CARDIOVERSION;  Surgeon: Candee Furbish, MD;  Location: [facility] OR;  Service: Cardiovascular;  Laterality: N/A;  . Tonsillectomy    . Dilation and curettage of uterus    . Abdominal hysterectomy    . Tubal ligation    . Fracture surgery    . Ankle fracture surgery Right ?1999  . Cardioversion N/A 05/02/2015    Procedure: CARDIOVERSION;  Surgeon: Jerline Pain, MD;  Location: [place];  Service: Cardiovascular;  Laterality: N/A;  . Cardioversion N/A 05/16/2015    Procedure: CARDIOVERSION;  Surgeon: Sueanne Margarita, MD;  Location: [facility] ENDOSCOPY;  Service: Cardiovascular;  Laterality: N/A;  . Tee without  cardioversion N/A 07/11/2015    Procedure: TRANSESOPHAGEAL ECHOCARDIOGRAM (TEE);  Surgeon: Skeet Latch, MD;  Location: [place];  Service: Cardiovascular;  Laterality: N/A;  . Electrophysiologic study N/A 07/12/2015    Procedure: Atrial Fibrillation Ablation;  Surgeon: Thompson Grayer, MD;  Location: [place] CV LAB;  Service: Cardiovascular;  Laterality: N/A;  . Cardioversion N/A 08/14/2015    Procedure: CARDIOVERSION;  Surgeon: Josue Hector, MD;  Location: [facility] ENDOSCOPY;  Service: Cardiovascular;  Laterality: N/A;  . Cardioversion N/A 09/13/2015    Procedure: CARDIOVERSION;  Surgeon: Dorothy Spark, MD;  Location: [facility] ENDOSCOPY;  Service: Cardiovascular;  Laterality: N/A;    Current Outpatient Prescriptions  Medication Sig Dispense Refill  . acetaminophen (TYLENOL) 500 MG tablet Take 500-1,000 mg by mouth every 6 (six) hours as needed for mild pain or moderate pain.     Marland Kitchen albuterol (PROVENTIL HFA;VENTOLIN HFA) 108 (90 BASE) MCG/ACT inhaler Inhale 2 puffs into the lungs every 4 (four) hours as needed for shortness of breath.     . dofetilide (TIKOSYN) 125 MCG capsule TAKE 3 CAPSULES (375 MCG TOTAL) BY MOUTH 2 (TWO) TIMES DAILY. 180 capsule 0  . guaiFENesin (MUCINEX) 600 MG 12 hr tablet Take 600 mg by mouth 2 (two) times daily as needed for cough or to loosen phlegm.    . hydrocortisone (BETA HC) 1 % lotion Apply 1 application topically 2 (two) times daily.    Marland Kitchen  levothyroxine (SYNTHROID, LEVOTHROID) 125 MCG tablet Take 125 mcg by mouth daily.    . magnesium oxide (MAG-OX) 400 MG tablet Take 1 tablet (400 mg total) by mouth daily. 30 tablet 3  . Multiple Vitamin (MULTIVITAMIN) tablet Take 2 tablets by mouth daily. Women's Gummies    . Probiotic Product (PROBIOTIC PO) Take 1 tablet by mouth daily.    . rivaroxaban (XARELTO) 20 MG TABS tablet Take 1 tablet (20 mg total) by mouth daily. 30 tablet 11  . spironolactone (ALDACTONE) 25 MG tablet Take 0.5 tablets (12.5 mg total) by mouth  daily. 45 tablet 3  . tolterodine (DETROL) 2 MG tablet Take 2 mg by mouth daily.    . triazolam (HALCION) 0.25 MG tablet Take 0.25 mg by mouth as directed. Take before Dental appts.  0  . valACYclovir (VALTREX) 1000 MG tablet Take 1,000 mg by mouth 2 (two) times daily as needed. Cold sore     No current facility-administered medications for this encounter.    Allergies  Allergen Reactions  . Lisinopril Swelling  . Penicillins Hives    Has patient had a PCN reaction causing immediate rash, facial/tongue/throat swelling, SOB or lightheadedness with hypotension: Yes Has patient had a PCN reaction causing severe rash involving mucus membranes or skin necrosis: No Has patient had a PCN reaction that required hospitalization No Has patient had a PCN reaction occurring within the last 10 years: No If all of the above answers are "NO", then may proceed with Cephalosporin use.  . Diltiazem Rash    Social History   Social History  . Marital Status: Married    Spouse Name: N/A  . Number of Children: N/A  . Years of Education: N/A   Occupational History  . Not on file.   Social History Main Topics  . Smoking status: Former Smoker -- 1.00 packs/day for 3 years    Types: Cigarettes  . Smokeless tobacco: Never Used     Comment: "quit smoking ~ 1976"  . Alcohol Use: 2.4 oz/week    4 Glasses of wine per week  . Drug Use: No  . Sexual Activity: Yes   Other Topics Concern  . Not on file   Social History Narrative    Family History  Problem Relation Age of Onset  . COPD Father   . Heart disease Father   . Cancer Father     skin  . Breast cancer Mother   . Cancer Mother     skin  . Hypertension Mother   . Heart attack Neg Hx   . Stroke Neg Hx     ROS- All systems are reviewed and negative except as per the HPI above  Physical Exam: Filed Vitals:   12/18/15 1559  BP: 120/62  Pulse: 53  Height: 5\' 4"  (1.626 m)  Weight: 200 lb (90.719 kg)    GEN- The patient is well  appearing, alert and oriented x 3 today.   Head- normocephalic, atraumatic Eyes-  Sclera clear, conjunctiva pink Ears- hearing intact Oropharynx- clear Neck- supple, no JVP Lymph- no cervical lymphadenopathy Lungs- Clear to ausculation bilaterally, normal work of breathing Heart-Mildly irregular( PAC's) rate and rhythm, no murmurs, rubs or gallops, PMI not laterally displaced GI- soft, NT, ND, + BS Extremities- no clubbing, cyanosis, or edema MS- no significant deformity or atrophy Skin- no rash or lesion Psych- euthymic mood, full affect Neuro- strength and sensation are intact  EKG- SBrady with pac's, pr int 184 ms, qrs int 74 ms, qtc  471 ms Epic records reviewed  Assessment and Plan: 1. Persistent afib s/p ablation Rare brief breakthrough episodes Continue tikosyn 375 mg bid Continue xarelto Labs per pt just drawn with PCP with acceptable k+/mag Congratulated on weight loss.  F/u with Dr. Rayann Heman as scheduled 5/3.  Geroge Baseman Carroll, [facility] [address] [place], [place] [address] [phone]

## 2015-12-18 NOTE — Telephone Encounter (Signed)
Pt called back in stating HR is back down but still just doesn't feel "right" and shes leaving to go to [facility] tomorrow would like to be checked out. Will bring in at 330 today.

## 2015-12-22 ENCOUNTER — Other Ambulatory Visit: Payer: Self-pay | Admitting: Nurse Practitioner

## 2015-12-31 ENCOUNTER — Other Ambulatory Visit: Payer: Self-pay | Admitting: Cardiology

## 2016-01-07 ENCOUNTER — Other Ambulatory Visit: Payer: Self-pay | Admitting: Internal Medicine

## 2016-01-10 ENCOUNTER — Ambulatory Visit: Payer: BC Managed Care – PPO | Admitting: Internal Medicine

## 2016-01-15 ENCOUNTER — Encounter: Payer: Self-pay | Admitting: Internal Medicine

## 2016-01-15 ENCOUNTER — Ambulatory Visit (INDEPENDENT_AMBULATORY_CARE_PROVIDER_SITE_OTHER): Payer: BC Managed Care – PPO | Admitting: Internal Medicine

## 2016-01-15 VITALS — BP 142/74 | HR 51 | Ht 64.0 in | Wt 198.0 lb

## 2016-01-15 DIAGNOSIS — R001 Bradycardia, unspecified: Secondary | ICD-10-CM

## 2016-01-15 DIAGNOSIS — I10 Essential (primary) hypertension: Secondary | ICD-10-CM | POA: Diagnosis not present

## 2016-01-15 DIAGNOSIS — I480 Paroxysmal atrial fibrillation: Secondary | ICD-10-CM | POA: Diagnosis not present

## 2016-01-15 NOTE — Progress Notes (Signed)
PCP: Antony Blackbird, MD Primary Cardiologist:  Dr Hilary Hertz is a 65 y.o. female who presents today for routine electrophysiology followup.  She is still maintaining sinus rhythm and is very happy with her current condition.  Today, she denies symptoms of palpitations, chest pain, shortness of breath,  lower extremity edema, dizziness, presyncope, or syncope.  The patient is otherwise without complaint today.   Past Medical History  Diagnosis Date  . HTN (hypertension)   . Asthma   . Hyperlipidemia   . Hypothyroidism   . Obesity   . Palpitation   . Atrial fibrillation ([place])   . Walking pneumonia ~ 10/2014  . Dysrhythmia     atrial fibrillation   Past Surgical History  Procedure Laterality Date  . Cardioversion  03/08/2012    Procedure: CARDIOVERSION;  Surgeon: Candee Furbish, MD;  Location: [facility] OR;  Service: Cardiovascular;  Laterality: N/A;  . Tonsillectomy    . Dilation and curettage of uterus    . Abdominal hysterectomy    . Tubal ligation    . Fracture surgery    . Ankle fracture surgery Right ?1999  . Cardioversion N/A 05/02/2015    Procedure: CARDIOVERSION;  Surgeon: Jerline Pain, MD;  Location: [place];  Service: Cardiovascular;  Laterality: N/A;  . Cardioversion N/A 05/16/2015    Procedure: CARDIOVERSION;  Surgeon: Sueanne Margarita, MD;  Location: [facility] ENDOSCOPY;  Service: Cardiovascular;  Laterality: N/A;  . Tee without cardioversion N/A 07/11/2015    Procedure: TRANSESOPHAGEAL ECHOCARDIOGRAM (TEE);  Surgeon: Skeet Latch, MD;  Location: [place];  Service: Cardiovascular;  Laterality: N/A;  . Electrophysiologic study N/A 07/12/2015    Procedure: Atrial Fibrillation Ablation;  Surgeon: Thompson Grayer, MD;  Location: [place] CV LAB;  Service: Cardiovascular;  Laterality: N/A;  . Cardioversion N/A 08/14/2015    Procedure: CARDIOVERSION;  Surgeon: Josue Hector, MD;  Location: [facility] ENDOSCOPY;  Service: Cardiovascular;  Laterality: N/A;  . Cardioversion N/A  09/13/2015    Procedure: CARDIOVERSION;  Surgeon: Dorothy Spark, MD;  Location: [facility] ENDOSCOPY;  Service: Cardiovascular;  Laterality: N/A;    ROS- all systems are reviewed and negatives except as per HPI above  Current Outpatient Prescriptions  Medication Sig Dispense Refill  . acetaminophen (TYLENOL) 500 MG tablet Take 500-1,000 mg by mouth every 6 (six) hours as needed for mild pain or moderate pain.     Marland Kitchen albuterol (PROVENTIL HFA;VENTOLIN HFA) 108 (90 BASE) MCG/ACT inhaler Inhale 2 puffs into the lungs every 4 (four) hours as needed for shortness of breath.     . dofetilide (TIKOSYN) 125 MCG capsule TAKE 3 CAPSULES (375 MCG TOTAL) BY MOUTH 2 (TWO) TIMES DAILY. 180 capsule 3  . levothyroxine (SYNTHROID, LEVOTHROID) 125 MCG tablet Take 125 mcg by mouth daily.    . magnesium oxide (MAG-OX) 400 (241.3 Mg) MG tablet TAKE 1 TABLET BY MOUTH EVERY DAY 30 tablet 3  . Multiple Vitamin (MULTIVITAMIN) tablet Take 2 tablets by mouth daily. Women's Gummies    . Probiotic Product (PROBIOTIC PO) Take 1 tablet by mouth daily.    Marland Kitchen spironolactone (ALDACTONE) 25 MG tablet Take 0.5 tablets (12.5 mg total) by mouth daily. 45 tablet 3  . tolterodine (DETROL) 2 MG tablet Take 2 mg by mouth daily.    . triazolam (HALCION) 0.25 MG tablet Take 0.25 mg by mouth as directed. Take before Dental appts.  0  . valACYclovir (VALTREX) 1000 MG tablet Take 1,000 mg by mouth 2 (two) times daily as needed.  Cold sore    . XARELTO 20 MG TABS tablet TAKE 1 TABLET (20 MG TOTAL) BY MOUTH DAILY. 30 tablet 7   No current facility-administered medications for this visit.    Physical Exam: Filed Vitals:   01/15/16 1625  BP: 142/74  Pulse: 51  Height: 5\' 4"  (1.626 m)  Weight: 198 lb (89.812 kg)    GEN- The patient is well appearing, alert and oriented x 3 today.   Head- normocephalic, atraumatic Eyes-  Sclera clear, conjunctiva pink Ears- hearing intact Oropharynx- clear Lungs- Clear to ausculation bilaterally, normal  work of breathing Heart- Regular rate and rhythm, no murmurs, rubs or gallops, PMI not laterally displaced GI- soft, NT, ND, + BS Extremities- no clubbing, cyanosis, or edema  ekg today reveals sinus rhythm 51 bpm with pacs, Qtc 444 msec   Assessment and Plan:  1.  Persistent atrial fibrillation Doing well s/p ablation She has a chads2vasc score of at least 2.  She is on xarelto She is reluctant to stop tikosyn.  Appears to be maintaining sinus but with atrial ectopy.  Given left atrial enlargement and prior persistence of AF, I would favor continuing tikosyn at this time  2. HTN Stable No change required today  3. Obesity Weight loss advised She is making real progress with this!  4. Sinus bradycardia Asymptomatic presently Caution with rate control  Follow-up in AF clinic in 3 months Return to see me in 6 months  Thompson Grayer MD, [facility] 01/15/2016 9:41 PM

## 2016-01-15 NOTE — Patient Instructions (Signed)
Medication Instructions:  Your physician recommends that you continue on your current medications as directed. Please refer to the Current Medication list given to you today.   Labwork: None ordered   Testing/Procedures: None ordered   Follow-Up:  Your physician recommends that you schedule a follow-up appointment in: 3 months with Donna Carroll, NP and 6 months with Dr Allred   Any Other Special Instructions Will Be Listed Below (If Applicable).     If you need a refill on your cardiac medications before your next appointment, please call your pharmacy.   

## 2016-04-15 ENCOUNTER — Other Ambulatory Visit: Payer: Self-pay | Admitting: Nurse Practitioner

## 2016-04-15 ENCOUNTER — Ambulatory Visit (HOSPITAL_COMMUNITY)
Admission: RE | Admit: 2016-04-15 | Discharge: 2016-04-15 | Disposition: A | Discharge status: Home / Self Care | Payer: BC Managed Care – PPO | Source: Ambulatory Visit | Attending: Nurse Practitioner | Admitting: Nurse Practitioner

## 2016-04-15 ENCOUNTER — Encounter (HOSPITAL_COMMUNITY): Payer: Self-pay | Admitting: Nurse Practitioner

## 2016-04-15 VITALS — BP 148/70 | HR 43 | Ht 64.0 in | Wt 193.8 lb

## 2016-04-15 DIAGNOSIS — Z7901 Long term (current) use of anticoagulants: Secondary | ICD-10-CM | POA: Insufficient documentation

## 2016-04-15 DIAGNOSIS — Z825 Family history of asthma and other chronic lower respiratory diseases: Secondary | ICD-10-CM | POA: Diagnosis not present

## 2016-04-15 DIAGNOSIS — E785 Hyperlipidemia, unspecified: Secondary | ICD-10-CM | POA: Diagnosis not present

## 2016-04-15 DIAGNOSIS — E039 Hypothyroidism, unspecified: Secondary | ICD-10-CM | POA: Insufficient documentation

## 2016-04-15 DIAGNOSIS — I10 Essential (primary) hypertension: Secondary | ICD-10-CM | POA: Insufficient documentation

## 2016-04-15 DIAGNOSIS — Z79899 Other long term (current) drug therapy: Secondary | ICD-10-CM | POA: Insufficient documentation

## 2016-04-15 DIAGNOSIS — E669 Obesity, unspecified: Secondary | ICD-10-CM | POA: Insufficient documentation

## 2016-04-15 DIAGNOSIS — Z888 Allergy status to other drugs, medicaments and biological substances status: Secondary | ICD-10-CM | POA: Insufficient documentation

## 2016-04-15 DIAGNOSIS — J45909 Unspecified asthma, uncomplicated: Secondary | ICD-10-CM | POA: Insufficient documentation

## 2016-04-15 DIAGNOSIS — Z880 Allergy status to penicillin: Secondary | ICD-10-CM | POA: Diagnosis not present

## 2016-04-15 DIAGNOSIS — Z6833 Body mass index (BMI) 33.0-33.9, adult: Secondary | ICD-10-CM | POA: Insufficient documentation

## 2016-04-15 DIAGNOSIS — Z87891 Personal history of nicotine dependence: Secondary | ICD-10-CM | POA: Insufficient documentation

## 2016-04-15 DIAGNOSIS — Z8249 Family history of ischemic heart disease and other diseases of the circulatory system: Secondary | ICD-10-CM | POA: Insufficient documentation

## 2016-04-15 DIAGNOSIS — I481 Persistent atrial fibrillation: Secondary | ICD-10-CM | POA: Diagnosis present

## 2016-04-15 DIAGNOSIS — I4819 Other persistent atrial fibrillation: Secondary | ICD-10-CM

## 2016-04-15 LAB — BASIC METABOLIC PANEL
Anion gap: 8 (ref 5–15)
BUN: 18 mg/dL (ref 6–20)
CALCIUM: 9.6 mg/dL (ref 8.9–10.3)
CO2: 24 mmol/L (ref 22–32)
CREATININE: 0.65 mg/dL (ref 0.44–1.00)
Chloride: 104 mmol/L (ref 101–111)
GFR calc Af Amer: >60 mL/min (ref >60)
GFR calc non Af Amer: >60 mL/min (ref >60)
GLUCOSE: 88 mg/dL (ref 65–99)
Potassium: 4.4 mmol/L (ref 3.5–5.1)
Sodium: 136 mmol/L (ref 135–145)

## 2016-04-15 LAB — MAGNESIUM: Magnesium: 2.1 mg/dL (ref 1.7–2.4)

## 2016-04-15 NOTE — Progress Notes (Signed)
Patient ID: Carrie Potter, female   DOB: [birth year], 65 y.o.   MRN: [id number]     Primary Care Physician: Antony Blackbird, MD Referring Physician: Dr. Albin Felling Carrie Potter is a 65 y.o. female with a h/o afib ablation on tikosyn tht has been doing well with very few episodes of afib. Just one short lived episode since seen here last. Has lost another 6 lbs ince seen here last last, having lost around 30 lbs. She is walking early every morning and feels great. She has brady at baseline but HR more like 50's/low 60's at home. She is not symptomatic with this and no chronotropic drugs on board.  Today, she denies symptoms of palpitations, chest pain, shortness of breath, orthopnea, PND, lower extremity edema, dizziness, presyncope, syncope, or neurologic sequela. The patient is tolerating medications without difficulties and is otherwise without complaint today.   Past Medical History:  Diagnosis Date  . Asthma   . Atrial fibrillation ([place])   . Dysrhythmia    atrial fibrillation  . HTN (hypertension)   . Hyperlipidemia   . Hypothyroidism   . Obesity   . Palpitation   . Walking pneumonia ~ 10/2014   Past Surgical History:  Procedure Laterality Date  . ABDOMINAL HYSTERECTOMY    . ANKLE FRACTURE SURGERY Right ?1999  . CARDIOVERSION  03/08/2012   Procedure: CARDIOVERSION;  Surgeon: Candee Furbish, MD;  Location: [place];  Service: Cardiovascular;  Laterality: N/A;  . CARDIOVERSION N/A 05/02/2015   Procedure: CARDIOVERSION;  Surgeon: Jerline Pain, MD;  Location: [place];  Service: Cardiovascular;  Laterality: N/A;  . CARDIOVERSION N/A 05/16/2015   Procedure: CARDIOVERSION;  Surgeon: Sueanne Margarita, MD;  Location: [facility] ENDOSCOPY;  Service: Cardiovascular;  Laterality: N/A;  . CARDIOVERSION N/A 08/14/2015   Procedure: CARDIOVERSION;  Surgeon: Josue Hector, MD;  Location: [facility] ENDOSCOPY;  Service: Cardiovascular;  Laterality: N/A;  . CARDIOVERSION N/A 09/13/2015   Procedure: CARDIOVERSION;   Surgeon: Dorothy Spark, MD;  Location: [place];  Service: Cardiovascular;  Laterality: N/A;  . DILATION AND CURETTAGE OF UTERUS    . ELECTROPHYSIOLOGIC STUDY N/A 07/12/2015   Procedure: Atrial Fibrillation Ablation;  Surgeon: Thompson Grayer, MD;  Location: [place] CV LAB;  Service: Cardiovascular;  Laterality: N/A;  . FRACTURE SURGERY    . TEE WITHOUT CARDIOVERSION N/A 07/11/2015   Procedure: TRANSESOPHAGEAL ECHOCARDIOGRAM (TEE);  Surgeon: Skeet Latch, MD;  Location: [facility] ENDOSCOPY;  Service: Cardiovascular;  Laterality: N/A;  . TONSILLECTOMY    . TUBAL LIGATION      Current Outpatient Prescriptions  Medication Sig Dispense Refill  . acetaminophen (TYLENOL) 500 MG tablet Take 500-1,000 mg by mouth every 6 (six) hours as needed for mild pain or moderate pain.     Marland Kitchen albuterol (PROVENTIL HFA;VENTOLIN HFA) 108 (90 BASE) MCG/ACT inhaler Inhale 2 puffs into the lungs every 4 (four) hours as needed for shortness of breath.     . dofetilide (TIKOSYN) 125 MCG capsule TAKE 3 CAPSULES (375 MCG TOTAL) BY MOUTH 2 (TWO) TIMES DAILY. 180 capsule 3  . levothyroxine (SYNTHROID, LEVOTHROID) 125 MCG tablet Take 125 mcg by mouth daily.    . magnesium oxide (MAG-OX) 400 (241.3 Mg) MG tablet TAKE 1 TABLET BY MOUTH EVERY DAY 30 tablet 3  . Multiple Vitamin (MULTIVITAMIN) tablet Take 2 tablets by mouth daily. Women's Gummies    . Probiotic Product (PROBIOTIC PO) Take 1 tablet by mouth daily.    Marland Kitchen spironolactone (ALDACTONE) 25 MG tablet Take 0.5  tablets (12.5 mg total) by mouth daily. 45 tablet 3  . tolterodine (DETROL) 2 MG tablet Take 2 mg by mouth daily.    . triazolam (HALCION) 0.25 MG tablet Take 0.25 mg by mouth as directed. Take before Dental appts.  0  . valACYclovir (VALTREX) 1000 MG tablet Take 1,000 mg by mouth 2 (two) times daily as needed. Cold sore    . XARELTO 20 MG TABS tablet TAKE 1 TABLET (20 MG TOTAL) BY MOUTH DAILY. 30 tablet 7   No current facility-administered medications for  this encounter.     Allergies  Allergen Reactions  . Lisinopril Swelling  . Penicillins Hives    Has patient had a PCN reaction causing immediate rash, facial/tongue/throat swelling, SOB or lightheadedness with hypotension: Yes Has patient had a PCN reaction causing severe rash involving mucus membranes or skin necrosis: No Has patient had a PCN reaction that required hospitalization No Has patient had a PCN reaction occurring within the last 10 years: No If all of the above answers are "NO", then may proceed with Cephalosporin use.  . Diltiazem Rash    Social History   Social History  . Marital status: Married    Spouse name: N/A  . Number of children: N/A  . Years of education: N/A   Occupational History  . Not on file.   Social History Main Topics  . Smoking status: Former Smoker    Packs/day: 1.00    Years: 3.00    Types: Cigarettes  . Smokeless tobacco: Never Used     Comment: "quit smoking ~ 1976"  . Alcohol use 2.4 oz/week    4 Glasses of wine per week  . Drug use: No  . Sexual activity: Yes   Other Topics Concern  . Not on file   Social History Narrative  . No narrative on file    Family History  Problem Relation Age of Onset  . COPD Father   . Heart disease Father   . Cancer Father     skin  . Breast cancer Mother   . Cancer Mother     skin  . Hypertension Mother   . Heart attack Neg Hx   . Stroke Neg Hx     ROS- All systems are reviewed and negative except as per the HPI above  Physical Exam: Vitals:   04/15/16 1042  BP: (!) 148/70  Pulse: (!) 43  Weight: 193 lb 12.8 oz (87.9 kg)  Height: 5\' 4"  (1.626 m)    GEN- The patient is well appearing, alert and oriented x 3 today.   Head- normocephalic, atraumatic Eyes-  Sclera clear, conjunctiva pink Ears- hearing intact Oropharynx- clear Neck- supple, no JVP Lymph- no cervical lymphadenopathy Lungs- Clear to ausculation bilaterally, normal work of breathing Heart-Slow, reg rate and  rhythm, no murmurs, rubs or gallops, PMI not laterally displaced GI- soft, NT, ND, + BS Extremities- no clubbing, cyanosis, or edema MS- no significant deformity or atrophy Skin- no rash or lesion Psych- euthymic mood, full affect Neuro- strength and sensation are intact  EKG- S Brady at 43 bpm, pr int 178 ms, qrs int 80 ms, qtc 441 ms( qtc stable) Epic records reviewed  Assessment and Plan: 1. Persistent afib s/p ablation Rare brief breakthrough episodes of afib Continue tikosyn 375 mg bid Continue xarelto Bmet/mag today Congratulated on weight loss and encouraged to continue with exercise program.  F/u with Dr. Rayann Heman as scheduled  10/17  Geroge Baseman. Olyvia Gopal, [place]  [facility] [address] [place], [place] [address] [phone]

## 2016-05-04 IMAGING — CR DG CHEST 2V
2 series · 2 of 2 positions shown · non-contrast
Comparison: October 26, 2014

CLINICAL DATA: Bradycardia and dizziness. History of atrial
fibrillation

EXAM:
CHEST  2 VIEW

[chest pa]
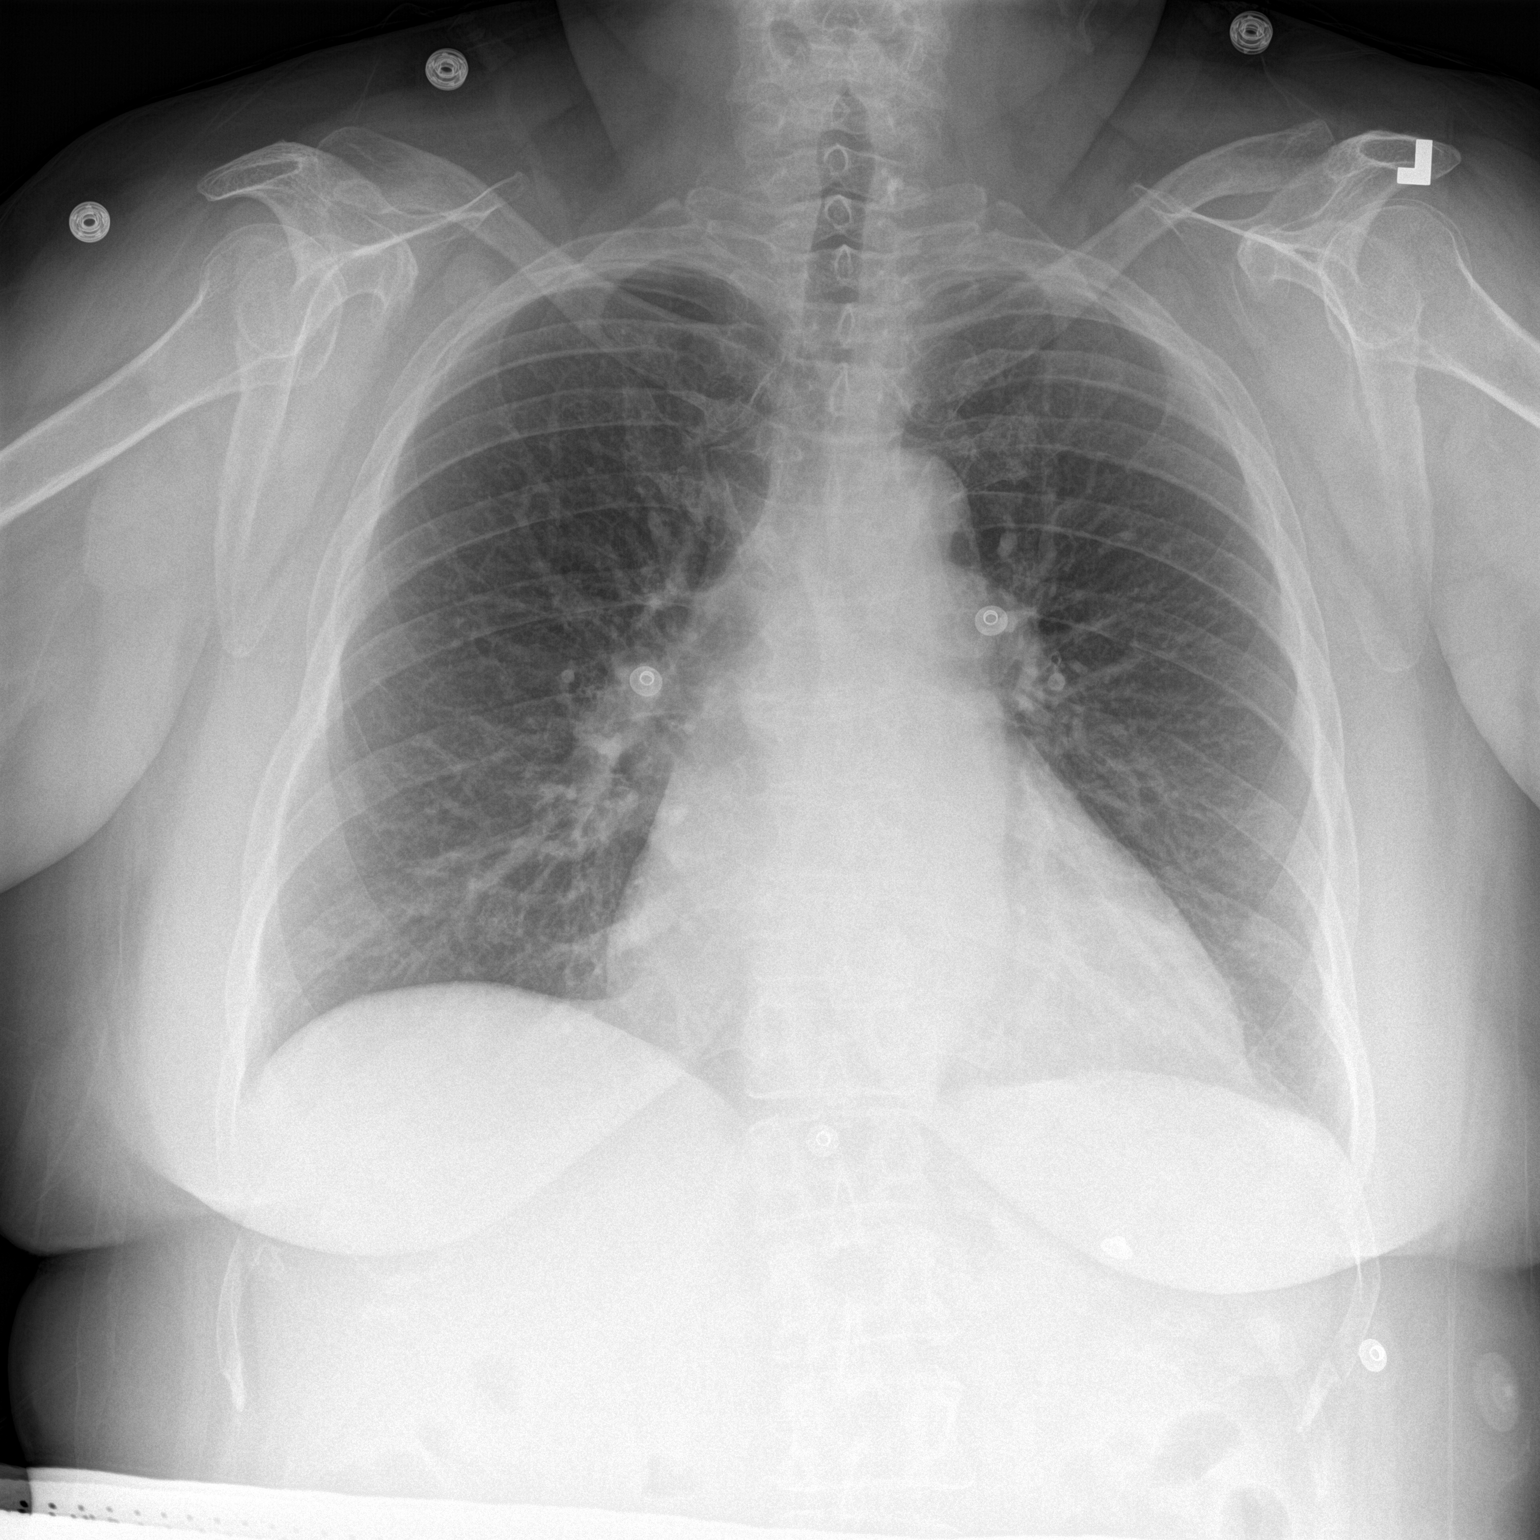

[chest lat]
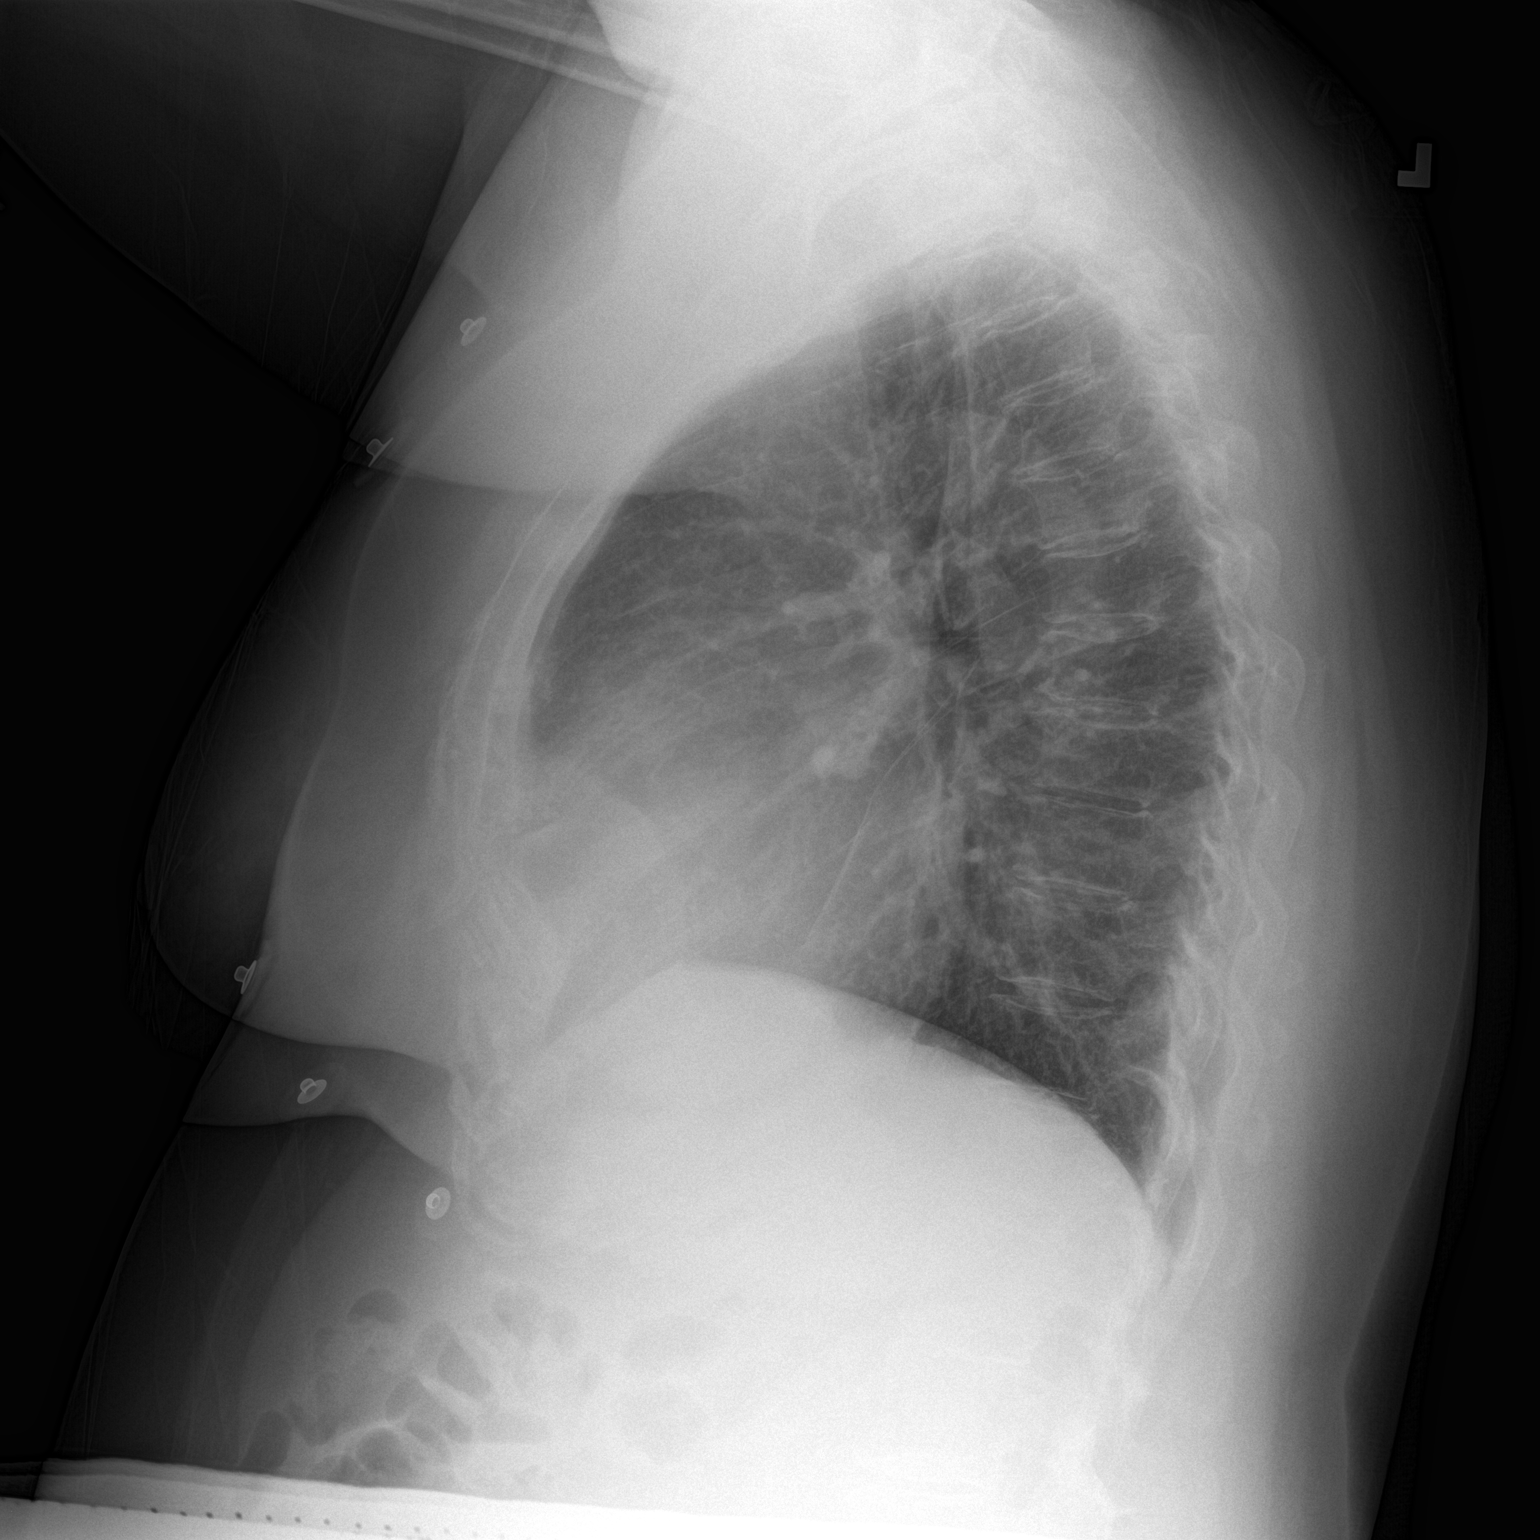

[2 of 2 positions shown; findings below may reference images not displayed]

FINDINGS: Previous infiltrate on the left has resolved. Currently lungs are
clear. Heart is borderline enlarged with pulmonary vascularity
within normal limits. No adenopathy. There is mild degenerative
change in the thoracic spine.
IMPRESSION: Lungs clear. Heart borderline enlarged with pulmonary vascularity
within normal limits.

## 2016-05-05 ENCOUNTER — Other Ambulatory Visit: Payer: Self-pay | Admitting: Internal Medicine

## 2016-07-02 ENCOUNTER — Encounter: Payer: Self-pay | Admitting: Internal Medicine

## 2016-07-15 ENCOUNTER — Ambulatory Visit (INDEPENDENT_AMBULATORY_CARE_PROVIDER_SITE_OTHER): Payer: BC Managed Care – PPO | Admitting: Internal Medicine

## 2016-07-15 VITALS — BP 130/70 | HR 40 | Ht 64.0 in | Wt 199.1 lb

## 2016-07-15 DIAGNOSIS — I481 Persistent atrial fibrillation: Secondary | ICD-10-CM

## 2016-07-15 DIAGNOSIS — I4819 Other persistent atrial fibrillation: Secondary | ICD-10-CM

## 2016-07-15 DIAGNOSIS — I10 Essential (primary) hypertension: Secondary | ICD-10-CM

## 2016-07-15 LAB — BASIC METABOLIC PANEL
BUN: 15 mg/dL (ref 7–25)
CO2: 25 mmol/L (ref 20–31)
Calcium: 9.7 mg/dL (ref 8.6–10.4)
Chloride: 103 mmol/L (ref 98–110)
Creat: 0.69 mg/dL (ref 0.50–0.99)
GLUCOSE: 93 mg/dL (ref 65–99)
POTASSIUM: 4.8 mmol/L (ref 3.5–5.3)
SODIUM: 141 mmol/L (ref 135–146)

## 2016-07-15 LAB — MAGNESIUM: MAGNESIUM: 2.2 mg/dL (ref 1.5–2.5)

## 2016-07-15 NOTE — Patient Instructions (Addendum)
Medication Instructions:  Your physician recommends that you continue on your current medications as directed. Please refer to the Current Medication list given to you today.   Labwork: Your physician recommends that you return for lab work in today: BMP/Mag   Testing/Procedures: None ordered   Follow-Up: Your physician recommends that you schedule a follow-up appointment in: 3 months with Roderic Palau, NP and 6 months with Dr Rayann Heman   Any Other Special Instructions Will Be Listed Below (If Applicable).     If you need a refill on your cardiac medications before your next appointment, please call your pharmacy.

## 2016-07-15 NOTE — Progress Notes (Signed)
PCP: Antony Blackbird, MD Primary Cardiologist:  Dr Hilary Hertz is a 65 y.o. female who presents today for routine electrophysiology followup.  She is doing very well.  Rare palpitations. Today, she denies symptoms of chest pain, shortness of breath,  lower extremity edema, dizziness, presyncope, or syncope.  The patient is otherwise without complaint today.   Past Medical History:  Diagnosis Date  . Asthma   . Atrial fibrillation ([place])   . Dysrhythmia    atrial fibrillation  . HTN (hypertension)   . Hyperlipidemia   . Hypothyroidism   . Obesity   . Palpitation   . Walking pneumonia ~ 10/2014   Past Surgical History:  Procedure Laterality Date  . ABDOMINAL HYSTERECTOMY    . ANKLE FRACTURE SURGERY Right ?1999  . CARDIOVERSION  03/08/2012   Procedure: CARDIOVERSION;  Surgeon: Candee Furbish, MD;  Location: [place];  Service: Cardiovascular;  Laterality: N/A;  . CARDIOVERSION N/A 05/02/2015   Procedure: CARDIOVERSION;  Surgeon: Jerline Pain, MD;  Location: [place];  Service: Cardiovascular;  Laterality: N/A;  . CARDIOVERSION N/A 05/16/2015   Procedure: CARDIOVERSION;  Surgeon: Sueanne Margarita, MD;  Location: [facility] ENDOSCOPY;  Service: Cardiovascular;  Laterality: N/A;  . CARDIOVERSION N/A 08/14/2015   Procedure: CARDIOVERSION;  Surgeon: Josue Hector, MD;  Location: [facility] ENDOSCOPY;  Service: Cardiovascular;  Laterality: N/A;  . CARDIOVERSION N/A 09/13/2015   Procedure: CARDIOVERSION;  Surgeon: Dorothy Spark, MD;  Location: [place];  Service: Cardiovascular;  Laterality: N/A;  . DILATION AND CURETTAGE OF UTERUS    . ELECTROPHYSIOLOGIC STUDY N/A 07/12/2015   Procedure: Atrial Fibrillation Ablation;  Surgeon: Thompson Grayer, MD;  Location: [place] CV LAB;  Service: Cardiovascular;  Laterality: N/A;  . FRACTURE SURGERY    . TEE WITHOUT CARDIOVERSION N/A 07/11/2015   Procedure: TRANSESOPHAGEAL ECHOCARDIOGRAM (TEE);  Surgeon: Skeet Latch, MD;  Location: [facility] ENDOSCOPY;   Service: Cardiovascular;  Laterality: N/A;  . TONSILLECTOMY    . TUBAL LIGATION      ROS- all systems are reviewed and negatives except as per HPI above  Current Outpatient Prescriptions  Medication Sig Dispense Refill  . acetaminophen (TYLENOL) 500 MG tablet Take 500-1,000 mg by mouth every 6 (six) hours as needed for mild pain or moderate pain.     Marland Kitchen albuterol (PROVENTIL HFA;VENTOLIN HFA) 108 (90 BASE) MCG/ACT inhaler Inhale 2 puffs into the lungs every 4 (four) hours as needed for shortness of breath.     . dofetilide (TIKOSYN) 125 MCG capsule TAKE 3 CAPSULES (375 MCG TOTAL) BY MOUTH 2 (TWO) TIMES DAILY. 180 capsule 3  . levothyroxine (SYNTHROID, LEVOTHROID) 125 MCG tablet Take 125 mcg by mouth daily.    . magnesium oxide (MAG-OX) 400 (241.3 Mg) MG tablet TAKE 1 TABLET BY MOUTH EVERY DAY 30 tablet 11  . Multiple Vitamin (MULTIVITAMIN) tablet Take 2 tablets by mouth daily. Women's Gummies    . Probiotic Product (PROBIOTIC PO) Take 1 tablet by mouth daily.    Marland Kitchen spironolactone (ALDACTONE) 25 MG tablet TAKE 1/2 TAB BY MOUTH ONCE DAILY. 45 tablet 3  . tolterodine (DETROL) 2 MG tablet Take 2 mg by mouth daily.    . triazolam (HALCION) 0.25 MG tablet Take 0.25 mg by mouth as directed. Take before Dental appts.  0  . valACYclovir (VALTREX) 1000 MG tablet Take 1,000 mg by mouth 2 (two) times daily as needed. Cold sore    . XARELTO 20 MG TABS tablet TAKE 1 TABLET (20 MG TOTAL)  BY MOUTH DAILY. 30 tablet 7   No current facility-administered medications for this visit.     Physical Exam: Vitals:   07/15/16 1110  BP: 130/70  Pulse: (!) 40  Weight: 199 lb 1.9 oz (90.3 kg)  Height: 5\' 4"  (1.626 m)    GEN- The patient is well appearing, alert and oriented x 3 today.   Head- normocephalic, atraumatic Eyes-  Sclera clear, conjunctiva pink Ears- hearing intact Oropharynx- clear Lungs- Clear to ausculation bilaterally, normal work of breathing Heart- Regular rate and rhythm, no murmurs, rubs or  gallops, PMI not laterally displaced GI- soft, NT, ND, + BS Extremities- no clubbing, cyanosis, or edema  ekg today reveals sinus rhythm 41 bpm, Qtc 440 msec   Assessment and Plan:  1.  Persistent atrial fibrillation Doing well s/p ablation She has a chads2vasc score of at least 2.  She is on xarelto She thinks that she may have had 3 episodes of AF since her ablation in the setting of memorial day, labor day, and recent trip to [place] world.  Each episode seems to have been precipitated by ETOH. She remains very pleased with the results of her ablation as prior to the procedure, she had refractory afib all of the time with extreme exercise intolerance. Bmet, mg today  2. HTN Stable No change required today  3. Obesity Weight loss advised She is making real progress with this!  4. Sinus bradycardia Asymptomatic presently Caution with rate control  Follow-up in AF clinic in 3 months Return to see me in 6 months  Thompson Grayer MD, [facility] 07/15/2016

## 2016-08-11 ENCOUNTER — Other Ambulatory Visit: Payer: Self-pay | Admitting: Nurse Practitioner

## 2016-08-25 ENCOUNTER — Emergency Department
Admission: EM | Admit: 2016-08-25 | Discharge: 2016-08-25 | Disposition: A | Discharge status: Home / Self Care | Payer: Worker's Compensation | Attending: Emergency Medicine | Admitting: Emergency Medicine

## 2016-08-25 ENCOUNTER — Emergency Department: Payer: Worker's Compensation

## 2016-08-25 ENCOUNTER — Encounter: Payer: Self-pay | Admitting: Emergency Medicine

## 2016-08-25 DIAGNOSIS — S80212A Abrasion, left knee, initial encounter: Secondary | ICD-10-CM | POA: Diagnosis not present

## 2016-08-25 DIAGNOSIS — I10 Essential (primary) hypertension: Secondary | ICD-10-CM | POA: Insufficient documentation

## 2016-08-25 DIAGNOSIS — S80211A Abrasion, right knee, initial encounter: Secondary | ICD-10-CM | POA: Diagnosis not present

## 2016-08-25 DIAGNOSIS — Y990 Civilian activity done for income or pay: Secondary | ICD-10-CM | POA: Diagnosis not present

## 2016-08-25 DIAGNOSIS — Y929 Unspecified place or not applicable: Secondary | ICD-10-CM | POA: Insufficient documentation

## 2016-08-25 DIAGNOSIS — T148XXA Other injury of unspecified body region, initial encounter: Secondary | ICD-10-CM

## 2016-08-25 DIAGNOSIS — Z79899 Other long term (current) drug therapy: Secondary | ICD-10-CM | POA: Diagnosis not present

## 2016-08-25 DIAGNOSIS — Z87891 Personal history of nicotine dependence: Secondary | ICD-10-CM | POA: Insufficient documentation

## 2016-08-25 DIAGNOSIS — J45909 Unspecified asthma, uncomplicated: Secondary | ICD-10-CM | POA: Diagnosis not present

## 2016-08-25 DIAGNOSIS — S2231XA Fracture of one rib, right side, initial encounter for closed fracture: Secondary | ICD-10-CM | POA: Insufficient documentation

## 2016-08-25 DIAGNOSIS — W19XXXA Unspecified fall, initial encounter: Secondary | ICD-10-CM | POA: Diagnosis not present

## 2016-08-25 DIAGNOSIS — Y9389 Activity, other specified: Secondary | ICD-10-CM | POA: Diagnosis not present

## 2016-08-25 DIAGNOSIS — S60512A Abrasion of left hand, initial encounter: Secondary | ICD-10-CM | POA: Insufficient documentation

## 2016-08-25 DIAGNOSIS — E039 Hypothyroidism, unspecified: Secondary | ICD-10-CM | POA: Insufficient documentation

## 2016-08-25 DIAGNOSIS — S299XXA Unspecified injury of thorax, initial encounter: Secondary | ICD-10-CM | POA: Diagnosis present

## 2016-08-25 DIAGNOSIS — S6992XA Unspecified injury of left wrist, hand and finger(s), initial encounter: Secondary | ICD-10-CM

## 2016-08-25 DIAGNOSIS — S60511A Abrasion of right hand, initial encounter: Secondary | ICD-10-CM | POA: Diagnosis not present

## 2016-08-25 MED ORDER — HYDROCODONE-ACETAMINOPHEN 5-325 MG PO TABS
1.0000 | ORAL_TABLET | Freq: Once | ORAL | Status: AC
  Administered 2016-08-25: 1 via ORAL
  Filled 2016-08-25: qty 1

## 2016-08-25 MED ORDER — HYDROCODONE-ACETAMINOPHEN 5-325 MG PO TABS: 1.0000 | ORAL_TABLET | ORAL | 0 refills | Status: DC | PRN

## 2016-08-25 NOTE — ED Triage Notes (Addendum)
Patient presents to the ED with fall at work.  Patient is complaining of right sided rib pain.  Patient has history of Afib.  Patient has scratches to hands. Patient is in no obvious distress at this time.  Patient is in no obvious distress at this time.   No obvious deformities.  No loss of consciousness with fall.

## 2016-08-25 NOTE — Discharge Instructions (Signed)
Use the incentive spirometer at least 3 times per day, but more if possible.  Follow up with orthopedics for symptoms that are not improving over the week.  Return to the ER for symptoms that change or worsen if unable to schedule an appointment.

## 2016-08-25 NOTE — ED Provider Notes (Signed)
[facility] Emergency Department Provider Note ____________________________________________  Time seen: Approximately 3:22 PM  I have reviewed the triage vital signs and the nursing notes.   HISTORY  Chief Complaint Fall    HPI Carrie Potter is a 65 y.o. female who presents to the emergency department for evaluation after sustaining a mechanical, non-syncopal fall today while at work. She has pain in the right thorax and left hand. She also states that she has some scratches on her hands and knees. She denies loss of consciousness or striking her head during the fall. She has a history of A. fib and is on Xarelto, but denies any palpitations, chest pain, cardiac concerns today.  Past Medical History:  Diagnosis Date  . Asthma   . Atrial fibrillation ([place])   . Dysrhythmia    atrial fibrillation  . HTN (hypertension)   . Hyperlipidemia   . Hypothyroidism   . Obesity   . Palpitation   . Walking pneumonia ~ 10/2014    Patient Active Problem List   Diagnosis Date Noted  . Long term (current) use of anticoagulants 08/12/2015  . Morbid obesity due to excess calories ([place]) 08/12/2015  . A-fib ([place]) 07/12/2015  . Bradycardia   . Dizzy   . Paroxysmal atrial fibrillation ([facility])   . Dyspnea 01/24/2014  . Obesity, unspecified 01/24/2014  . Chronic anticoagulation 01/24/2014  . HTN (hypertension)   . Asthma   . Hyperlipidemia   . Hypothyroidism   . Obesity   . Palpitation   . Atrial fibrillation [facility])     Past Surgical History:  Procedure Laterality Date  . ABDOMINAL HYSTERECTOMY    . ANKLE FRACTURE SURGERY Right ?1999  . CARDIOVERSION  03/08/2012   Procedure: CARDIOVERSION;  Surgeon: Candee Furbish, MD;  Location: [place];  Service: Cardiovascular;  Laterality: N/A;  . CARDIOVERSION N/A 05/02/2015   Procedure: CARDIOVERSION;  Surgeon: Jerline Pain, MD;  Location: [place];  Service: Cardiovascular;  Laterality: N/A;  . CARDIOVERSION N/A 05/16/2015    Procedure: CARDIOVERSION;  Surgeon: Sueanne Margarita, MD;  Location: [facility] ENDOSCOPY;  Service: Cardiovascular;  Laterality: N/A;  . CARDIOVERSION N/A 08/14/2015   Procedure: CARDIOVERSION;  Surgeon: Josue Hector, MD;  Location: [facility] ENDOSCOPY;  Service: Cardiovascular;  Laterality: N/A;  . CARDIOVERSION N/A 09/13/2015   Procedure: CARDIOVERSION;  Surgeon: Dorothy Spark, MD;  Location: [place];  Service: Cardiovascular;  Laterality: N/A;  . DILATION AND CURETTAGE OF UTERUS    . ELECTROPHYSIOLOGIC STUDY N/A 07/12/2015   Procedure: Atrial Fibrillation Ablation;  Surgeon: Thompson Grayer, MD;  Location: [place] CV LAB;  Service: Cardiovascular;  Laterality: N/A;  . FRACTURE SURGERY    . TEE WITHOUT CARDIOVERSION N/A 07/11/2015   Procedure: TRANSESOPHAGEAL ECHOCARDIOGRAM (TEE);  Surgeon: Skeet Latch, MD;  Location: [facility] ENDOSCOPY;  Service: Cardiovascular;  Laterality: N/A;  . TONSILLECTOMY    . TUBAL LIGATION      Prior to Admission medications   Medication Sig Start Date End Date Taking? Authorizing Provider  acetaminophen (TYLENOL) 500 MG tablet Take 500-1,000 mg by mouth every 6 (six) hours as needed for mild pain or moderate pain.     Historical Provider, MD  albuterol (PROVENTIL HFA;VENTOLIN HFA) 108 (90 BASE) MCG/ACT inhaler Inhale 2 puffs into the lungs every 4 (four) hours as needed for shortness of breath.     Historical Provider, MD  dofetilide (TIKOSYN) 125 MCG capsule TAKE 3 CAPSULES (375 MCG TOTAL) BY MOUTH 2 (TWO) TIMES DAILY. 08/11/16  Sherran Needs, NP  HYDROcodone-acetaminophen (NORCO/VICODIN) 5-325 MG tablet Take 1 tablet by mouth every 4 (four) hours as needed. 08/25/16   Victorino Dike, FNP  levothyroxine (SYNTHROID, LEVOTHROID) 125 MCG tablet Take 125 mcg by mouth daily. 10/26/14   Historical Provider, MD  magnesium oxide (MAG-OX) 400 (241.3 Mg) MG tablet TAKE 1 TABLET BY MOUTH EVERY DAY 05/06/16   Thompson Grayer, MD  Multiple Vitamin (MULTIVITAMIN) tablet Take 2  tablets by mouth daily. Women's Gummies    Historical Provider, MD  Probiotic Product (PROBIOTIC PO) Take 1 tablet by mouth daily.    Historical Provider, MD  spironolactone (ALDACTONE) 25 MG tablet TAKE 1/2 TAB BY MOUTH ONCE DAILY. 05/06/16   Thompson Grayer, MD  tolterodine (DETROL) 2 MG tablet Take 2 mg by mouth daily.    Historical Provider, MD  triazolam (HALCION) 0.25 MG tablet Take 0.25 mg by mouth as directed. Take before Dental appts. 04/03/15   Historical Provider, MD  valACYclovir (VALTREX) 1000 MG tablet Take 1,000 mg by mouth 2 (two) times daily as needed. Cold sore    Historical Provider, MD  XARELTO 20 MG TABS tablet TAKE 1 TABLET (20 MG TOTAL) BY MOUTH DAILY. 12/31/15   Jerline Pain, MD    Allergies Lisinopril; Penicillins; and Diltiazem  Family History  Problem Relation Age of Onset  . COPD Father   . Heart disease Father   . Cancer Father     skin  . Breast cancer Mother   . Cancer Mother     skin  . Hypertension Mother   . Heart attack Neg Hx   . Stroke Neg Hx     Social History Social History  Substance Use Topics  . Smoking status: Former Smoker    Packs/day: 1.00    Years: 3.00    Types: Cigarettes  . Smokeless tobacco: Never Used     Comment: "quit smoking ~ 1976"  . Alcohol use 2.4 oz/week    4 Glasses of wine per week    Review of Systems Constitutional: No recent illness. Cardiovascular: Denies chest pain or palpitations. Respiratory: Denies shortness of breath. Musculoskeletal: Pain in Right lateral thorax, left hand Skin: Positive for abrasions to bilateral hands and knees Neurological: Negative for focal weakness or numbness. Negative for confusion,  ____________________________________________   PHYSICAL EXAM:  VITAL SIGNS: ED Triage Vitals  Enc Vitals Group     BP 08/25/16 1256 (!) 217/73     Pulse Rate 08/25/16 1256 (!) 58     Resp 08/25/16 1256 18     Temp 08/25/16 1256 98.3 F (36.8 C)     Temp Source 08/25/16 1256 Oral      SpO2 08/25/16 1256 99 %     Weight 08/25/16 1300 200 lb (90.7 kg)     Height 08/25/16 1300 5\' 4"  (1.626 m)     Head Circumference --      Peak Flow --      Pain Score 08/25/16 1300 10     Pain Loc --      Pain Edu? --      Excl. in [place]? --     Constitutional: Alert and oriented. Well appearing and in no acute distress. Eyes: Conjunctivae are normal. EOMI. Head: Atraumatic. Neck: No stridor.  Respiratory: Normal respiratory effort.   Musculoskeletal: Tender to palpation over the right lateral thorax without flail segment noted, tenderness to palpation over the thenar eminence of the left hand, but there is no tenderness in the  scaphoid area on palpation. Patient does have full range of motion of the hands and wrists, but complains of pain with full flexion of the left thumb. Full range of motion of bilateral knees without obvious deformity. Patient is able to bear weight without pain. Nexus criteria is negative. Neurologic:  Normal speech and language. No gross focal neurologic deficits are appreciated. Speech is normal. No gait instability. Skin: Superficial abrasions noted to the palmar surfaces of both hands as well as prepatellar areas of both knees Psychiatric: Mood and affect are normal. Speech and behavior are normal.  ____________________________________________   LABS (all labs ordered are listed, but only abnormal results are displayed)  Labs Reviewed - No data to display ____________________________________________  RADIOLOGY  Right rib and chest film shows a possible seventh anterior rib fracture. Left hand film negative for acute abnormality. ____________________________________________   PROCEDURES  Procedure(s) performed: None   ____________________________________________   INITIAL IMPRESSION / ASSESSMENT AND PLAN / ED COURSE  65 year old female who presents to the emergency department for evaluation after sustaining a mechanical, non-syncopal fall. Although  patient does take Xarelto, she denies striking her head or any loss of consciousnes and therefore I do not see need for a CT scan of her head. She is fully alert and oriented and and having interactive conversation with staff and her husband. X-rays of the chest and right rib area as well as left hand films will be performed while in the emergency department. She will be given a Norco for pain.  Clinical Course as of Aug 26 1508  Tue Aug 25, 2016  1436 Patient reports decrease in pain. Preliminary results of x-rays discussed with the patient and family. Awaiting final reading from radiology  [CT]    Clinical Course User Index [CT] Victorino Dike, FNP   Exam was essentially benign and the only x-ray finding is a possible seventh anterior rib fracture that is nondisplaced. She will be discharged home and given follow-up instructions to see orthopedics if her hand pain and rib pain is not improving over the week. She was given a prescription for Norco, as this helped her while in the emergency department and the fact that she is unable to take any NSAIDs due to being on Xarelto. She was strongly encouraged to use her incentive spirometry multiple times per day to prevent a pneumonia. She was also advised to follow-up with her primary care provider for any symptom that changes or worsens. She was given strict return precautions for the emergency department.  Pertinent labs & imaging results that were available during my care of the patient were reviewed by me and considered in my medical decision making (see chart for details).  ____________________________________________   FINAL CLINICAL IMPRESSION(S) / ED DIAGNOSES  Final diagnoses:  Closed fracture of one rib of right side, initial encounter  Hand injury, left, initial encounter  Lewis, FNP 08/26/16 [place], MD 08/26/16 2031

## 2016-08-30 ENCOUNTER — Other Ambulatory Visit: Payer: Self-pay | Admitting: Cardiology

## 2016-08-31 ENCOUNTER — Telehealth: Payer: Self-pay

## 2016-08-31 NOTE — Telephone Encounter (Signed)
Approval for coverage of Dofetilide received from [facility]. Valid through 08/27/2017.

## 2016-09-02 ENCOUNTER — Other Ambulatory Visit: Payer: Self-pay | Admitting: Cardiology

## 2016-09-02 NOTE — Telephone Encounter (Signed)
Medication Detail    Disp Refills Start End   rivaroxaban (XARELTO) 20 MG TABS tablet 30 tablet 11 09/01/2016    Sig - Route: Take 1 tablet (20 mg total) by mouth daily with supper. - Oral   E-Prescribing Status: Receipt confirmed by pharmacy (09/01/2016 7:50 AM EST)   Pharmacy   [facility], [address]

## 2016-09-16 ENCOUNTER — Telehealth: Payer: Self-pay | Admitting: Internal Medicine

## 2016-09-16 NOTE — Telephone Encounter (Signed)
°  New Prob   Pt c/o BP issue: STAT if pt c/o blurred vision, one-sided weakness or slurred speech  1. What are your last 5 BP readings?  210/80 (today) 210/70 (today) 204/80 (taken today-most recent)  2. Are you having any other symptoms (ex. Dizziness, headache, blurred vision, passed out)?  No symptom. However, pt states she fell at work on 08/25/16. States she cracked a rib, hurt her wrist and thumb. At that time, pt states her blood pressure was also high.  3. What is your BP issue? Pt states her blood pressure has been abnormally high. States she has been compliant with all medications.

## 2016-09-17 NOTE — Telephone Encounter (Signed)
Carrie Potter is requesting someone please give her a call, thanks.

## 2016-09-17 NOTE — Telephone Encounter (Signed)
Saw PCP yesterday and her BP 210/80.  She saw Dr Sheryn Bison and wanted her to follow up with the BP due to her being on Tikosyn.  She is still sleeping in a chair but feels she is getting better.  Wants to know if she should take a full Aldactone.  She is not having any symptoms that her BP is up.  I let her know I would discuss with dr Rayann Heman tomorrow and return a call to her with his recommendations

## 2016-09-18 MED ORDER — AMLODIPINE BESYLATE 5 MG PO TABS: 5.0000 mg | ORAL_TABLET | Freq: Every day | ORAL | 3 refills | Status: DC

## 2016-09-18 NOTE — Telephone Encounter (Signed)
Discussed with Dr Rayann Heman and her would like for her to start Norvasc 5 mg daily and follow up with Tommye Standard, PA next week.  Patient aware and will come in on Tues.  Medication sent in to pharmacy

## 2016-09-21 NOTE — Progress Notes (Deleted)
Cardiology Office Note Date:  09/21/2016  Patient ID:  Carrie Potter, Carrie Potter 1951-05-07, MRN [id number] PCP:  Antony Blackbird, MD  Cardiologist:  ***  ***refresh   Chief Complaint: ***  History of Present Illness: Carrie Potter is a 66 y.o. female with history of HTN, HLD, hypothyroidism, obesity (acivieyly losing weight), and atrial fibrillation s/p ablation on Tikosyn, most recently seen by Dr. Rayann Heman in November at that time with rare palpitations (noted to have been proceeded by ETOH intake/holidays) and asymptomatic bradycardia with no changes made at that time.  More recently via telephone 09/18/16 started on Norvasc  with reported BP 210/80 by PMD.    *** weight loss *** palpitations *** HTN, response to norvasc *** bleeding/xarelto, check CBC, last Dec 2016 *** symptoms *** Tikosyn EKG, Nov [place] *** Dec 13th had FALL with R rib fx    AFib Hx: Refractory prior to ablation PVI ablation with Dr. Rayann Heman 07/12/15 DCCV June 2013, and Aug, Sept, Nov and Dec 2016  AAD tx: Tikosyn 07/15/16 K+ 4.8, Creat 0.69 (cal Cl is 118), mag 2.2, QTc 451ms   Past Medical History:  Diagnosis Date  . Asthma   . Atrial fibrillation ([place])   . Dysrhythmia    atrial fibrillation  . HTN (hypertension)   . Hyperlipidemia   . Hypothyroidism   . Obesity   . Palpitation   . Walking pneumonia ~ 10/2014    Past Surgical History:  Procedure Laterality Date  . ABDOMINAL HYSTERECTOMY    . ANKLE FRACTURE SURGERY Right ?1999  . CARDIOVERSION  03/08/2012   Procedure: CARDIOVERSION;  Surgeon: Candee Furbish, MD;  Location: [place];  Service: Cardiovascular;  Laterality: N/A;  . CARDIOVERSION N/A 05/02/2015   Procedure: CARDIOVERSION;  Surgeon: Jerline Pain, MD;  Location: [place];  Service: Cardiovascular;  Laterality: N/A;  . CARDIOVERSION N/A 05/16/2015   Procedure: CARDIOVERSION;  Surgeon: Sueanne Margarita, MD;  Location: [facility] ENDOSCOPY;  Service: Cardiovascular;  Laterality: N/A;  . CARDIOVERSION N/A  08/14/2015   Procedure: CARDIOVERSION;  Surgeon: Josue Hector, MD;  Location: [facility] ENDOSCOPY;  Service: Cardiovascular;  Laterality: N/A;  . CARDIOVERSION N/A 09/13/2015   Procedure: CARDIOVERSION;  Surgeon: Dorothy Spark, MD;  Location: [place];  Service: Cardiovascular;  Laterality: N/A;  . DILATION AND CURETTAGE OF UTERUS    . ELECTROPHYSIOLOGIC STUDY N/A 07/12/2015   Procedure: Atrial Fibrillation Ablation;  Surgeon: Thompson Grayer, MD;  Location: [place] CV LAB;  Service: Cardiovascular;  Laterality: N/A;  . FRACTURE SURGERY    . TEE WITHOUT CARDIOVERSION N/A 07/11/2015   Procedure: TRANSESOPHAGEAL ECHOCARDIOGRAM (TEE);  Surgeon: Skeet Latch, MD;  Location: [facility] ENDOSCOPY;  Service: Cardiovascular;  Laterality: N/A;  . TONSILLECTOMY    . TUBAL LIGATION      Current Outpatient Prescriptions  Medication Sig Dispense Refill  . acetaminophen (TYLENOL) 500 MG tablet Take 500-1,000 mg by mouth every 6 (six) hours as needed for mild pain or moderate pain.     Marland Kitchen albuterol (PROVENTIL HFA;VENTOLIN HFA) 108 (90 BASE) MCG/ACT inhaler Inhale 2 puffs into the lungs every 4 (four) hours as needed for shortness of breath.     Marland Kitchen amLODipine (NORVASC) 5 MG tablet Take 1 tablet (5 mg total) by mouth daily. 90 tablet 3  . dofetilide (TIKOSYN) 125 MCG capsule TAKE 3 CAPSULES (375 MCG TOTAL) BY MOUTH 2 (TWO) TIMES DAILY. 180 capsule 3  . HYDROcodone-acetaminophen (NORCO/VICODIN) 5-325 MG tablet Take 1 tablet by mouth every 4 (four) hours as  needed. 20 tablet 0  . levothyroxine (SYNTHROID, LEVOTHROID) 125 MCG tablet Take 125 mcg by mouth daily.    . magnesium oxide (MAG-OX) 400 (241.3 Mg) MG tablet TAKE 1 TABLET BY MOUTH EVERY DAY 30 tablet 11  . Multiple Vitamin (MULTIVITAMIN) tablet Take 2 tablets by mouth daily. Women's Gummies    . Probiotic Product (PROBIOTIC PO) Take 1 tablet by mouth daily.    . rivaroxaban (XARELTO) 20 MG TABS tablet Take 1 tablet (20 mg total) by mouth daily with supper.  30 tablet 11  . spironolactone (ALDACTONE) 25 MG tablet TAKE 1/2 TAB BY MOUTH ONCE DAILY. 45 tablet 3  . tolterodine (DETROL) 2 MG tablet Take 2 mg by mouth daily.    . triazolam (HALCION) 0.25 MG tablet Take 0.25 mg by mouth as directed. Take before Dental appts.  0  . valACYclovir (VALTREX) 1000 MG tablet Take 1,000 mg by mouth 2 (two) times daily as needed. Cold sore     No current facility-administered medications for this visit.     Allergies:   Lisinopril; Penicillins; and Diltiazem   Social History:  The patient  reports that she has quit smoking. Her smoking use included Cigarettes. She has a 3.00 pack-year smoking history. She has never used smokeless tobacco. She reports that she drinks about 2.4 oz of alcohol per week . She reports that she does not use drugs.   Family History:  The patient's family history includes Breast cancer in her mother; COPD in her father; Cancer in her father and mother; Heart disease in her father; Hypertension in her mother.  ROS:  Please see the history of present illness.  All other systems are reviewed and otherwise negative.   PHYSICAL EXAM: *** VS:  There were no vitals taken for this visit. BMI: There is no height or weight on file to calculate BMI. Well nourished, well developed, in no acute distress  HEENT: normocephalic, atraumatic  Neck: no JVD, carotid bruits or masses Cardiac:  ***; no significant murmurs, no rubs, or gallops Lungs:  clear to auscultation bilaterally, no wheezing, rhonchi or rales  Abd: soft, nontender MS: no deformity or atrophy Ext: ***  edema  Skin: warm and dry, no rash Neuro:  No gross deficits appreciated Psych: euthymic mood, full affect    EKG:  Done 07/15/16: SB, 41bpm, PR 172ms, QRS 82ms, QTc 446ms 01/04/15: TTE Study Conclusions - Left ventricle: The cavity size was normal. There was mild   concentric hypertrophy. Systolic function was normal. The   estimated ejection fraction was in the range of 55%  to 60%. Wall   motion was normal; there were no regional wall motion   abnormalities. The study was not technically sufficient to allow   evaluation of LV diastolic dysfunction due to atrial   fibrillation. - Aortic valve: Trileaflet; normal thickness leaflets. There was no   regurgitation. - Mitral valve: Structurally normal valve. There was mild   regurgitation. - Left atrium: The atrium was moderately dilated. - Right ventricle: Systolic function was normal. - Right atrium: The atrium was normal in size. - Tricuspid valve: There was mild regurgitation. - Pulmonic valve: There was no regurgitation. - Pulmonary arteries: Systolic pressure was within the normal   range. - Inferior vena cava: The vessel was normal in size. - Pericardium, extracardiac: A trivial pericardial effusion was   identified posterior to the heart.  Recent Labs: 07/15/2016: BUN 15; Creat 0.69; Magnesium 2.2; Potassium 4.8; Sodium 141  No results found for  requested labs within last 8760 hours.   CrCl cannot be calculated (Patient's most recent lab result is older than the maximum 21 days allowed.).   Wt Readings from Last 3 Encounters:  08/25/16 200 lb (90.7 kg)  07/15/16 199 lb 1.9 oz (90.3 kg)  04/15/16 193 lb 12.8 oz (87.9 kg)     Other studies reviewed: Additional studies/records reviewed today include: summarized above  ASSESSMENT AND PLAN:  1. Paroxysmal AFib     CHA2DS2Vasc is 2, on Xarelto/Tikosyn  2. HTN    ***   Disposition: F/u with ***  Current medicines are reviewed at length with the patient today.  The patient did not have any concerns regarding medicines.***  Signed, Jennings Books, PA-C 09/21/2016 6:38 AM     [facility] [place] [address] [phone] (office)  [phone] (fax)

## 2016-09-22 ENCOUNTER — Ambulatory Visit: Payer: BC Managed Care – PPO | Admitting: Physician Assistant

## 2016-09-22 NOTE — Progress Notes (Signed)
Cardiology Office Note Date:  09/21/2016  Patient ID:  Carrie Potter, Carrie Potter Jul 14, 1951, MRN [id number] PCP:  Antony Blackbird, MD     Cardiologist:  Dr. Marlou Porch Electrophysiologist: Dr. Rayann Heman    Chief Complaint: f/u on BP response to medicine  History of Present Illness: Carrie Potter is a 66 y.o. female with history of HTN, HLD, hypothyroidism, obesity (acivieyly losing weight), and atrial fibrillation s/p ablation on Tikosyn, most recently seen by Dr. Rayann Heman in November at that time with rare palpitations (noted to have been proceeded by ETOH intake/holidays) and asymptomatic bradycardia with no changes made at that time.  More recently via telephone 09/18/16 started on Norvasc  with reported BP 210/80 by PMD.   Her BP at home 150's/60's since starting the norvasc Saturday, though has developed a rash across the bottom of her abdomen,(similar to the rash she got on her legs with diltiazem though not quite as severe)  In the shower with the hot water the rash becomes much more pronounced and itches.  She denies any bleeding or signs of bleeding, no palpitations, has not felt like she has had AF since her last DCCV.  She remains sore from her fall/rib fracture, in the last month has not done any exercise or physical activity due to pain, and over the holidays has been more liberal with her diet putting on a fair amount of weight.  She denies SOB, edema.  No dizziness, near syncope or syncope   AFib Hx: Refractory prior to ablation PVI ablation with Dr. Rayann Heman 07/12/15 DCCV June 2013, and Aug, Sept, Nov and Dec 2016  AAD tx: Tikosyn 07/15/16 K+ 4.8, Creat 0.69 (cal Cl is 118), mag 2.2, QTc 443ms       Past Medical History:  Diagnosis Date  . Asthma   . Atrial fibrillation ([place])   . Dysrhythmia    atrial fibrillation  . HTN (hypertension)   . Hyperlipidemia   . Hypothyroidism   . Obesity   . Palpitation   . Walking pneumonia ~ 10/2014         Past Surgical  History:  Procedure Laterality Date  . ABDOMINAL HYSTERECTOMY    . ANKLE FRACTURE SURGERY Right ?1999  . CARDIOVERSION  03/08/2012   Procedure: CARDIOVERSION;  Surgeon: Candee Furbish, MD;  Location: [place];  Service: Cardiovascular;  Laterality: N/A;  . CARDIOVERSION N/A 05/02/2015   Procedure: CARDIOVERSION;  Surgeon: Jerline Pain, MD;  Location: [place];  Service: Cardiovascular;  Laterality: N/A;  . CARDIOVERSION N/A 05/16/2015   Procedure: CARDIOVERSION;  Surgeon: Sueanne Margarita, MD;  Location: [facility] ENDOSCOPY;  Service: Cardiovascular;  Laterality: N/A;  . CARDIOVERSION N/A 08/14/2015   Procedure: CARDIOVERSION;  Surgeon: Josue Hector, MD;  Location: [facility] ENDOSCOPY;  Service: Cardiovascular;  Laterality: N/A;  . CARDIOVERSION N/A 09/13/2015   Procedure: CARDIOVERSION;  Surgeon: Dorothy Spark, MD;  Location: [place];  Service: Cardiovascular;  Laterality: N/A;  . DILATION AND CURETTAGE OF UTERUS    . ELECTROPHYSIOLOGIC STUDY N/A 07/12/2015   Procedure: Atrial Fibrillation Ablation;  Surgeon: Thompson Grayer, MD;  Location: [place] CV LAB;  Service: Cardiovascular;  Laterality: N/A;  . FRACTURE SURGERY    . TEE WITHOUT CARDIOVERSION N/A 07/11/2015   Procedure: TRANSESOPHAGEAL ECHOCARDIOGRAM (TEE);  Surgeon: Skeet Latch, MD;  Location: [facility] ENDOSCOPY;  Service: Cardiovascular;  Laterality: N/A;  . TONSILLECTOMY    . TUBAL LIGATION            Current Outpatient Prescriptions  Medication Sig Dispense Refill  . acetaminophen (TYLENOL) 500 MG tablet Take 500-1,000 mg by mouth every 6 (six) hours as needed for mild pain or moderate pain.     Marland Kitchen albuterol (PROVENTIL HFA;VENTOLIN HFA) 108 (90 BASE) MCG/ACT inhaler Inhale 2 puffs into the lungs every 4 (four) hours as needed for shortness of breath.     Marland Kitchen amLODipine (NORVASC) 5 MG tablet Take 1 tablet (5 mg total) by mouth daily. 90 tablet 3  . dofetilide (TIKOSYN) 125 MCG capsule TAKE 3 CAPSULES (375 MCG  TOTAL) BY MOUTH 2 (TWO) TIMES DAILY. 180 capsule 3  . HYDROcodone-acetaminophen (NORCO/VICODIN) 5-325 MG tablet Take 1 tablet by mouth every 4 (four) hours as needed. 20 tablet 0  . levothyroxine (SYNTHROID, LEVOTHROID) 125 MCG tablet Take 125 mcg by mouth daily.    . magnesium oxide (MAG-OX) 400 (241.3 Mg) MG tablet TAKE 1 TABLET BY MOUTH EVERY DAY 30 tablet 11  . Multiple Vitamin (MULTIVITAMIN) tablet Take 2 tablets by mouth daily. Women's Gummies    . Probiotic Product (PROBIOTIC PO) Take 1 tablet by mouth daily.    . rivaroxaban (XARELTO) 20 MG TABS tablet Take 1 tablet (20 mg total) by mouth daily with supper. 30 tablet 11  . spironolactone (ALDACTONE) 25 MG tablet TAKE 1/2 TAB BY MOUTH ONCE DAILY. 45 tablet 3  . tolterodine (DETROL) 2 MG tablet Take 2 mg by mouth daily.    . triazolam (HALCION) 0.25 MG tablet Take 0.25 mg by mouth as directed. Take before Dental appts.  0  . valACYclovir (VALTREX) 1000 MG tablet Take 1,000 mg by mouth 2 (two) times daily as needed. Cold sore     No current facility-administered medications for this visit.     Allergies:   Lisinopril; Penicillins; and Diltiazem   Social History:  The patient  reports that she has quit smoking. Her smoking use included Cigarettes. She has a 3.00 pack-year smoking history. She has never used smokeless tobacco. She reports that she drinks about 2.4 oz of alcohol per week . She reports that she does not use drugs.   Family History:  The patient's family history includes Breast cancer in her mother; COPD in her father; Cancer in her father and mother; Heart disease in her father; Hypertension in her mother.  ROS:  Please see the history of present illness.  All other systems are reviewed and otherwise negative.   PHYSICAL EXAM:  VS:  166/74, 52bpm, 215lbs Well nourished, well developed, in no acute distress  HEENT: normocephalic, atraumatic  Neck: no JVD, carotid bruits or masses Cardiac:  RRR,  bradycardic; soft SM, no rubs, or gallops Lungs:  clear to auscultation bilaterally, no wheezing, rhonchi or rales  Abd: soft, nontender MS: no deformity or atrophy Ext: no  edema  Skin: warm and dry, no rash Neuro:  No gross deficits appreciated Psych: euthymic mood, full affect    EKG:  Done 07/15/16: SB, 41bpm, PR 144ms, QRS 31ms, QTc 441ms 01/04/15: TTE Study Conclusions - Left ventricle: The cavity size was normal. There was mild concentric hypertrophy. Systolic function was normal. The estimated ejection fraction was in the range of 55% to 60%. Wall motion was normal; there were no regional wall motion abnormalities. The study was not technically sufficient to allow evaluation of LV diastolic dysfunction due to atrial fibrillation. - Aortic valve: Trileaflet; normal thickness leaflets. There was no regurgitation. - Mitral valve: Structurally normal valve. There was mild regurgitation. - Left atrium: The atrium was moderately dilated. -  Right ventricle: Systolic function was normal. - Right atrium: The atrium was normal in size. - Tricuspid valve: There was mild regurgitation. - Pulmonic valve: There was no regurgitation. - Pulmonary arteries: Systolic pressure was within the normal range. - Inferior vena cava: The vessel was normal in size. - Pericardium, extracardiac: A trivial pericardial effusion was identified posterior to the heart.  Recent Labs: 07/15/2016: BUN 15; Creat 0.69; Magnesium 2.2; Potassium 4.8; Sodium 141  No results found for requested labs within last 8760 hours.   CrCl cannot be calculated (Patient's most recent lab result is older than the maximum 21 days allowed.).      Wt Readings from Last 3 Encounters:  08/25/16 200 lb (90.7 kg)  07/15/16 199 lb 1.9 oz (90.3 kg)  04/15/16 193 lb 12.8 oz (87.9 kg)     Other studies reviewed: Additional studies/records reviewed today include: summarized above  ASSESSMENT AND  PLAN:  1. Paroxysmal AFib     CHA2DS2Vasc is 2, on Xarelto/Tikosyn  2. HTN     improved but only after a couple days has developed a slight abdominal rash with the norvasc.  Discussed other medicine options, hydralazine would be my next suggestion, her husband is a Immunologist and would first like to try taking the spironolactone BID (25mg  total), discussed will need close management of her K+ particularly    Disposition: She sees the AFib clinic 10/15/16, she will stop the norvasc, increase the aldactone to 12.5mg  BID and have labs done either at the AF clinic for BMET or come by here if needed for the lab draw.  The patient/husband will monitor her BP daily, if >160/90 routinely will notify us and will adjust POC.  Current medicines are reviewed at length with the patient today.  The patient did not have any concerns regarding medicines.  Haywood Lasso, PA-C 09/21/2016 6:38 AM     [place] [address] [place] [address] [phone] (office)  [phone] (fax)

## 2016-09-23 ENCOUNTER — Telehealth (HOSPITAL_COMMUNITY): Payer: Self-pay | Admitting: *Deleted

## 2016-09-23 ENCOUNTER — Ambulatory Visit (INDEPENDENT_AMBULATORY_CARE_PROVIDER_SITE_OTHER): Payer: BC Managed Care – PPO | Admitting: Physician Assistant

## 2016-09-23 VITALS — BP 166/74 | HR 74 | Ht 64.0 in | Wt 215.0 lb

## 2016-09-23 DIAGNOSIS — I10 Essential (primary) hypertension: Secondary | ICD-10-CM

## 2016-09-23 DIAGNOSIS — I480 Paroxysmal atrial fibrillation: Secondary | ICD-10-CM

## 2016-09-23 MED ORDER — SPIRONOLACTONE 25 MG PO TABS: 12.5000 mg | ORAL_TABLET | Freq: Two times a day (BID) | ORAL | 3 refills | Status: DC

## 2016-09-23 NOTE — Patient Instructions (Addendum)
Medication Instructions:   STOP TAKING AMLODIPINE   START TAKING SPIROLACTONE 12.5 TWICE A DAY   If you need a refill on your cardiac medications before your next appointment, please call your pharmacy.  Labwork: NONE ORDERED  TODAY    Testing/Procedures: NONE ORDERED  TODAY    Follow-Up: AS SCHEDULED WITH DONNA CAROL IN February    Any Other Special Instructions Will Be Listed Below (If Applicable).

## 2016-09-23 NOTE — Telephone Encounter (Signed)
Pt called in stating she went to urgent care today for sinus infection. They prescribed a prednisone taper and Zpak. Concerned in regards to [place] which antibiotic to take.  Discussed with Roderic Palau NP -- recommends doxycycline since allergy to PCN and caution with prednisone as it can increase afib burden. Pt will inform prescribing physician of recommendations.

## 2016-10-15 ENCOUNTER — Encounter (HOSPITAL_COMMUNITY): Payer: Self-pay | Admitting: Nurse Practitioner

## 2016-10-15 ENCOUNTER — Ambulatory Visit (HOSPITAL_COMMUNITY)
Admission: RE | Admit: 2016-10-15 | Discharge: 2016-10-15 | Disposition: A | Discharge status: Home / Self Care | Payer: BC Managed Care – PPO | Source: Ambulatory Visit | Attending: Nurse Practitioner | Admitting: Nurse Practitioner

## 2016-10-15 VITALS — BP 156/82 | HR 44 | Ht 64.0 in | Wt 218.4 lb

## 2016-10-15 DIAGNOSIS — Z7901 Long term (current) use of anticoagulants: Secondary | ICD-10-CM | POA: Diagnosis not present

## 2016-10-15 DIAGNOSIS — E669 Obesity, unspecified: Secondary | ICD-10-CM | POA: Insufficient documentation

## 2016-10-15 DIAGNOSIS — Z8249 Family history of ischemic heart disease and other diseases of the circulatory system: Secondary | ICD-10-CM | POA: Insufficient documentation

## 2016-10-15 DIAGNOSIS — Z808 Family history of malignant neoplasm of other organs or systems: Secondary | ICD-10-CM | POA: Diagnosis not present

## 2016-10-15 DIAGNOSIS — I480 Paroxysmal atrial fibrillation: Secondary | ICD-10-CM

## 2016-10-15 DIAGNOSIS — I10 Essential (primary) hypertension: Secondary | ICD-10-CM | POA: Diagnosis not present

## 2016-10-15 DIAGNOSIS — Z87891 Personal history of nicotine dependence: Secondary | ICD-10-CM | POA: Insufficient documentation

## 2016-10-15 DIAGNOSIS — I481 Persistent atrial fibrillation: Secondary | ICD-10-CM | POA: Insufficient documentation

## 2016-10-15 DIAGNOSIS — J45909 Unspecified asthma, uncomplicated: Secondary | ICD-10-CM | POA: Insufficient documentation

## 2016-10-15 DIAGNOSIS — E039 Hypothyroidism, unspecified: Secondary | ICD-10-CM | POA: Insufficient documentation

## 2016-10-15 DIAGNOSIS — Z803 Family history of malignant neoplasm of breast: Secondary | ICD-10-CM | POA: Diagnosis not present

## 2016-10-15 DIAGNOSIS — Z9889 Other specified postprocedural states: Secondary | ICD-10-CM | POA: Diagnosis not present

## 2016-10-15 DIAGNOSIS — Z90710 Acquired absence of both cervix and uterus: Secondary | ICD-10-CM | POA: Diagnosis not present

## 2016-10-15 DIAGNOSIS — Z836 Family history of other diseases of the respiratory system: Secondary | ICD-10-CM | POA: Insufficient documentation

## 2016-10-15 DIAGNOSIS — E785 Hyperlipidemia, unspecified: Secondary | ICD-10-CM | POA: Diagnosis not present

## 2016-10-15 DIAGNOSIS — Z888 Allergy status to other drugs, medicaments and biological substances status: Secondary | ICD-10-CM | POA: Insufficient documentation

## 2016-10-15 DIAGNOSIS — Z880 Allergy status to penicillin: Secondary | ICD-10-CM | POA: Insufficient documentation

## 2016-10-15 LAB — BASIC METABOLIC PANEL
Anion gap: 7 (ref 5–15)
BUN: 19 mg/dL (ref 6–20)
CALCIUM: 9.5 mg/dL (ref 8.9–10.3)
CHLORIDE: 104 mmol/L (ref 101–111)
CO2: 27 mmol/L (ref 22–32)
CREATININE: 0.72 mg/dL (ref 0.44–1.00)
GFR calc non Af Amer: >60 mL/min (ref >60)
Glucose, Bld: 106 mg/dL — ABNORMAL HIGH (ref 65–99)
Potassium: 4.6 mmol/L (ref 3.5–5.1)
Sodium: 138 mmol/L (ref 135–145)

## 2016-10-15 LAB — MAGNESIUM: Magnesium: 2 mg/dL (ref 1.7–2.4)

## 2016-10-15 MED ORDER — SPIRONOLACTONE 25 MG PO TABS: 12.5000 mg | ORAL_TABLET | Freq: Two times a day (BID) | ORAL | 3 refills | Status: DC

## 2016-10-15 NOTE — Progress Notes (Addendum)
Patient ID: Carrie Potter, female   DOB: 04-05-1951, 66 y.o.   MRN: [id number]     Primary Care Physician: Cammy Copa, MD Referring Physician: Dr. Albin Felling Carrie Potter is a 66 y.o. female with a h/o afib ablation on tikosyn tht has been doing well with very few episodes of afib. Just one short lived episode since seen here last. Has lost another 6 lbs ince seen here last last, having lost around 30 lbs. She is walking early every morning and feels great. She has brady at baseline but HR more like 50's/low 60's at home. She is not symptomatic with this and no chronotropic drugs on board.  Returns to afib clinic, 2/1. Unfortunately, she had a fall in December at work due to uneven concrete and had injury to both hands, rt worse than left,as well as a fx rib. She is still having issues with rt hand. Due to the fx rib, she has been sedentary and unfortunately has gained back 20 lbs. Her BP has not been as well controlled since the weight gain. She is hoping to return to her walking routine soon. On the positive side, she is staying in [place].   Today, she denies symptoms of palpitations, chest pain, shortness of breath, orthopnea, PND, lower extremity edema, dizziness, presyncope, syncope, or neurologic sequela. The patient is tolerating medications without difficulties and is otherwise without complaint today.   Past Medical History:  Diagnosis Date  . Asthma   . Atrial fibrillation ([place])   . Dysrhythmia    atrial fibrillation  . HTN (hypertension)   . Hyperlipidemia   . Hypothyroidism   . Obesity   . Palpitation   . Walking pneumonia ~ 10/2014   Past Surgical History:  Procedure Laterality Date  . ABDOMINAL HYSTERECTOMY    . ANKLE FRACTURE SURGERY Right ?1999  . CARDIOVERSION  03/08/2012   Procedure: CARDIOVERSION;  Surgeon: Candee Furbish, MD;  Location: [place];  Service: Cardiovascular;  Laterality: N/A;  . CARDIOVERSION N/A 05/02/2015   Procedure: CARDIOVERSION;  Surgeon:  Jerline Pain, MD;  Location: [place];  Service: Cardiovascular;  Laterality: N/A;  . CARDIOVERSION N/A 05/16/2015   Procedure: CARDIOVERSION;  Surgeon: Sueanne Margarita, MD;  Location: [facility] ENDOSCOPY;  Service: Cardiovascular;  Laterality: N/A;  . CARDIOVERSION N/A 08/14/2015   Procedure: CARDIOVERSION;  Surgeon: Josue Hector, MD;  Location: [facility] ENDOSCOPY;  Service: Cardiovascular;  Laterality: N/A;  . CARDIOVERSION N/A 09/13/2015   Procedure: CARDIOVERSION;  Surgeon: Dorothy Spark, MD;  Location: [place];  Service: Cardiovascular;  Laterality: N/A;  . DILATION AND CURETTAGE OF UTERUS    . ELECTROPHYSIOLOGIC STUDY N/A 07/12/2015   Procedure: Atrial Fibrillation Ablation;  Surgeon: Thompson Grayer, MD;  Location: [place] CV LAB;  Service: Cardiovascular;  Laterality: N/A;  . FRACTURE SURGERY    . TEE WITHOUT CARDIOVERSION N/A 07/11/2015   Procedure: TRANSESOPHAGEAL ECHOCARDIOGRAM (TEE);  Surgeon: Skeet Latch, MD;  Location: [facility] ENDOSCOPY;  Service: Cardiovascular;  Laterality: N/A;  . TONSILLECTOMY    . TUBAL LIGATION      Current Outpatient Prescriptions  Medication Sig Dispense Refill  . acetaminophen (TYLENOL) 500 MG tablet Take 500-1,000 mg by mouth every 6 (six) hours as needed for mild pain or moderate pain.     Marland Kitchen albuterol (PROVENTIL HFA;VENTOLIN HFA) 108 (90 BASE) MCG/ACT inhaler Inhale 2 puffs into the lungs every 4 (four) hours as needed for shortness of breath.     . dofetilide (TIKOSYN) 125 MCG capsule  TAKE 3 CAPSULES (375 MCG TOTAL) BY MOUTH 2 (TWO) TIMES DAILY. 180 capsule 3  . levothyroxine (SYNTHROID, LEVOTHROID) 125 MCG tablet Take 125 mcg by mouth daily.    . magnesium oxide (MAG-OX) 400 (241.3 Mg) MG tablet TAKE 1 TABLET BY MOUTH EVERY DAY 30 tablet 11  . Multiple Vitamin (MULTIVITAMIN) tablet Take 2 tablets by mouth daily. Women's Gummies    . Probiotic Product (PROBIOTIC PO) Take 1 tablet by mouth daily.    . rivaroxaban (XARELTO) 20 MG TABS tablet Take 1  tablet (20 mg total) by mouth daily with supper. 30 tablet 11  . spironolactone (ALDACTONE) 25 MG tablet Take 0.5 tablets (12.5 mg total) by mouth 2 (two) times daily. 90 tablet 3  . tolterodine (DETROL) 2 MG tablet Take 2 mg by mouth daily.    . triazolam (HALCION) 0.25 MG tablet Take 0.25 mg by mouth as directed. Take before Dental appts.  0  . valACYclovir (VALTREX) 1000 MG tablet Take 1,000 mg by mouth 2 (two) times daily as needed. Cold sore     No current facility-administered medications for this encounter.     Allergies  Allergen Reactions  . Lisinopril Swelling  . Penicillins Hives    Has patient had a PCN reaction causing immediate rash, facial/tongue/throat swelling, SOB or lightheadedness with hypotension: Yes Has patient had a PCN reaction causing severe rash involving mucus membranes or skin necrosis: No Has patient had a PCN reaction that required hospitalization No Has patient had a PCN reaction occurring within the last 10 years: No If all of the above answers are "NO", then may proceed with Cephalosporin use.  . Diltiazem Rash  . Norvasc [Amlodipine] Rash    Social History   Social History  . Marital status: Married    Spouse name: N/A  . Number of children: N/A  . Years of education: N/A   Occupational History  . Not on file.   Social History Main Topics  . Smoking status: Former Smoker    Packs/day: 1.00    Years: 3.00    Types: Cigarettes  . Smokeless tobacco: Never Used     Comment: "quit smoking ~ 1976"  . Alcohol use 2.4 oz/week    4 Glasses of wine per week  . Drug use: No  . Sexual activity: Yes   Other Topics Concern  . Not on file   Social History Narrative  . No narrative on file    Family History  Problem Relation Age of Onset  . COPD Father   . Heart disease Father   . Cancer Father     skin  . Breast cancer Mother   . Cancer Mother     skin  . Hypertension Mother   . Heart attack Neg Hx   . Stroke Neg Hx     ROS- All  systems are reviewed and negative except as per the HPI above  Physical Exam: Vitals:   10/15/16 0908  BP: (!) 156/82  Pulse: (!) 44  Weight: 218 lb 6.4 oz (99.1 kg)  Height: 5\' 4"  (1.626 m)    GEN- The patient is well appearing, alert and oriented x 3 today.   Head- normocephalic, atraumatic Eyes-  Sclera clear, conjunctiva pink Ears- hearing intact Oropharynx- clear Neck- supple, no JVP Lymph- no cervical lymphadenopathy Lungs- Clear to ausculation bilaterally, normal work of breathing Heart-Slow, reg rate and rhythm, no murmurs, rubs or gallops, PMI not laterally displaced GI- soft, NT, ND, + BS Extremities- no  clubbing, cyanosis, or edema MS- no significant deformity or atrophy Skin- no rash or lesion Psych- euthymic mood, full affect Neuro- strength and sensation are intact  EKG- S Brady at 44 bpm, pr int 178 ms, qrs int 74 ms, qtc 451 ms( qtc stable) Epic records reviewed  Assessment and Plan:  1. Persistent afib s/p ablation Maintaining SR on tikosyn, slow ventricular response but chronic and asymtpomatic Continue tikosyn 375 mg bid Continue xarelto Bmet/mag today Congratulated on weight loss and encouraged to continue with exercise program.  2. HTN Not as optimally controlled since fall with injury and weight gain Try to return to walking routine Continue spironolactone   F/u with Dr. Rayann Heman as scheduled   Spring 2018  Butch Penny C. Kashonda Sarkisyan, [facility] [address] [place], [place] [address] [phone]

## 2016-11-27 ENCOUNTER — Other Ambulatory Visit (HOSPITAL_COMMUNITY): Payer: Self-pay | Admitting: *Deleted

## 2016-11-27 MED ORDER — SPIRONOLACTONE 25 MG PO TABS: 12.5000 mg | ORAL_TABLET | Freq: Two times a day (BID) | ORAL | 3 refills | Status: DC

## 2016-12-14 ENCOUNTER — Other Ambulatory Visit: Payer: Self-pay | Admitting: Family Medicine

## 2016-12-14 DIAGNOSIS — Z1231 Encounter for screening mammogram for malignant neoplasm of breast: Secondary | ICD-10-CM

## 2016-12-15 ENCOUNTER — Other Ambulatory Visit: Payer: Self-pay | Admitting: Nurse Practitioner

## 2016-12-30 ENCOUNTER — Ambulatory Visit: Payer: Self-pay

## 2016-12-31 ENCOUNTER — Ambulatory Visit: Payer: Self-pay

## 2016-12-31 ENCOUNTER — Ambulatory Visit
Admission: RE | Admit: 2016-12-31 | Discharge: 2016-12-31 | Disposition: A | Discharge status: Home / Self Care | Payer: BC Managed Care – PPO | Source: Ambulatory Visit | Attending: Family Medicine | Admitting: Family Medicine

## 2016-12-31 DIAGNOSIS — Z1231 Encounter for screening mammogram for malignant neoplasm of breast: Secondary | ICD-10-CM

## 2017-01-04 ENCOUNTER — Telehealth (HOSPITAL_COMMUNITY): Payer: Self-pay | Admitting: *Deleted

## 2017-01-04 NOTE — Telephone Encounter (Signed)
Patient called in stating she has been in afib since Friday. She is under a lot of stress and wondering if this was her trigger. Shes missed no doses of her Tikosyn. HR between 70-90 some around 110 with exertion. Wants to be seen on 4/24 if still in afib for management.

## 2017-01-05 ENCOUNTER — Ambulatory Visit (HOSPITAL_COMMUNITY): Payer: Self-pay | Admitting: Nurse Practitioner

## 2017-01-20 ENCOUNTER — Encounter: Payer: Self-pay | Admitting: Internal Medicine

## 2017-02-10 ENCOUNTER — Encounter: Payer: Self-pay | Admitting: Internal Medicine

## 2017-02-10 ENCOUNTER — Ambulatory Visit (INDEPENDENT_AMBULATORY_CARE_PROVIDER_SITE_OTHER): Payer: BC Managed Care – PPO | Admitting: Internal Medicine

## 2017-02-10 VITALS — BP 168/76 | HR 42 | Ht 64.0 in | Wt 225.0 lb

## 2017-02-10 DIAGNOSIS — I10 Essential (primary) hypertension: Secondary | ICD-10-CM | POA: Diagnosis not present

## 2017-02-10 DIAGNOSIS — R001 Bradycardia, unspecified: Secondary | ICD-10-CM | POA: Diagnosis not present

## 2017-02-10 DIAGNOSIS — I480 Paroxysmal atrial fibrillation: Secondary | ICD-10-CM | POA: Diagnosis not present

## 2017-02-10 MED ORDER — SPIRONOLACTONE 25 MG PO TABS: 25.0000 mg | ORAL_TABLET | Freq: Two times a day (BID) | ORAL | 3 refills | Status: DC

## 2017-02-10 NOTE — Progress Notes (Signed)
PCP: Aura Dials, MD Primary Cardiologist: Dr Hilary Hertz is a 66 y.o. female who presents today for routine electrophysiology followup.  Since last being seen in our clinic, the patient reports doing very well. Unfortunately, she has gained over 20 lbs.  With her weight gain, she has had more afib.  She has had 6 episodes since January.  Today, she denies symptoms of palpitations, chest pain, shortness of breath,  lower extremity edema, dizziness, presyncope, or syncope.  The patient is otherwise without complaint today.   Past Medical History:  Diagnosis Date  . Asthma   . Atrial fibrillation ([place])   . Dysrhythmia    atrial fibrillation  . HTN (hypertension)   . Hyperlipidemia   . Hypothyroidism   . Obesity   . Palpitation   . Walking pneumonia ~ 10/2014   Past Surgical History:  Procedure Laterality Date  . ABDOMINAL HYSTERECTOMY    . ANKLE FRACTURE SURGERY Right ?1999  . CARDIOVERSION  03/08/2012   Procedure: CARDIOVERSION;  Surgeon: Candee Furbish, MD;  Location: [place];  Service: Cardiovascular;  Laterality: N/A;  . CARDIOVERSION N/A 05/02/2015   Procedure: CARDIOVERSION;  Surgeon: Jerline Pain, MD;  Location: [place];  Service: Cardiovascular;  Laterality: N/A;  . CARDIOVERSION N/A 05/16/2015   Procedure: CARDIOVERSION;  Surgeon: Sueanne Margarita, MD;  Location: [facility] ENDOSCOPY;  Service: Cardiovascular;  Laterality: N/A;  . CARDIOVERSION N/A 08/14/2015   Procedure: CARDIOVERSION;  Surgeon: Josue Hector, MD;  Location: [facility] ENDOSCOPY;  Service: Cardiovascular;  Laterality: N/A;  . CARDIOVERSION N/A 09/13/2015   Procedure: CARDIOVERSION;  Surgeon: Dorothy Spark, MD;  Location: [place];  Service: Cardiovascular;  Laterality: N/A;  . DILATION AND CURETTAGE OF UTERUS    . ELECTROPHYSIOLOGIC STUDY N/A 07/12/2015   Procedure: Atrial Fibrillation Ablation;  Surgeon: Thompson Grayer, MD;  Location: [place] CV LAB;  Service: Cardiovascular;  Laterality: N/A;  .  FRACTURE SURGERY    . TEE WITHOUT CARDIOVERSION N/A 07/11/2015   Procedure: TRANSESOPHAGEAL ECHOCARDIOGRAM (TEE);  Surgeon: Skeet Latch, MD;  Location: [facility] ENDOSCOPY;  Service: Cardiovascular;  Laterality: N/A;  . TONSILLECTOMY    . TUBAL LIGATION      ROS- all systems are reviewed and negatives except as per HPI above  Current Outpatient Prescriptions  Medication Sig Dispense Refill  . acetaminophen (TYLENOL) 500 MG tablet Take 500-1,000 mg by mouth every 6 (six) hours as needed for mild pain or moderate pain.     Marland Kitchen albuterol (PROVENTIL HFA;VENTOLIN HFA) 108 (90 BASE) MCG/ACT inhaler Inhale 2 puffs into the lungs every 4 (four) hours as needed for shortness of breath.     . dofetilide (TIKOSYN) 125 MCG capsule TAKE 3 CAPSULES (375 MCG TOTAL) BY MOUTH 2 (TWO) TIMES DAILY. 180 capsule 3  . levothyroxine (SYNTHROID, LEVOTHROID) 125 MCG tablet Take 125 mcg by mouth daily.    . magnesium oxide (MAG-OX) 400 (241.3 Mg) MG tablet TAKE 1 TABLET BY MOUTH EVERY DAY 30 tablet 11  . Multiple Vitamin (MULTIVITAMIN) tablet Take 1 tablet by mouth daily. Women's Gummies     . Probiotic Product (PROBIOTIC PO) Take 1 tablet by mouth daily.    . rivaroxaban (XARELTO) 20 MG TABS tablet Take 1 tablet (20 mg total) by mouth daily with supper. 30 tablet 11  . spironolactone (ALDACTONE) 25 MG tablet Take 0.5 tablets (12.5 mg total) by mouth 2 (two) times daily. 90 tablet 3  . tolterodine (DETROL) 2 MG tablet Take 2 mg by  mouth daily.    . triazolam (HALCION) 0.25 MG tablet Take 0.25 mg by mouth as directed. Take before Dental appts.  0  . valACYclovir (VALTREX) 1000 MG tablet Take 1,000 mg by mouth 2 (two) times daily as needed. Cold sore     No current facility-administered medications for this visit.     Physical Exam: Vitals:   02/10/17 1605  BP: (!) 168/76  Pulse: (!) 42  SpO2: 98%  Weight: 225 lb (102.1 kg)  Height: 5\' 4"  (1.626 m)    GEN- The patient is well appearing, alert and oriented x 3  today.   Head- normocephalic, atraumatic Eyes-  Sclera clear, conjunctiva pink Ears- hearing intact Oropharynx- clear Lungs- Clear to ausculation bilaterally, normal work of breathing Heart- bradycardic regular rhythm, no murmurs, rubs or gallops, PMI not laterally displaced GI- soft, NT, ND, + BS Extremities- no clubbing, cyanosis, or edema  EKG tracing ordered today is personally reviewed and shows sinus bradycardia 42 bpm, Qtc 455 msec  Assessment and Plan:  1. Persistent afib Doing reasonably well post ablation Lifestyle modification discussed at length again today Return in 3 months If AF does not improve, she will likely favor repeat ablation at that time Bmet, mg today on tikosyn No changes today  2. Sinus bradycardia Asymptomatic Limits medical options She is clear that she wishes to avoid pacing  3. HTN Elevated BP today Lifestyle modification Will increase aldactone to 25mg  BID bmet today Repeat bmet in 2 weeks Follow-up with PCP  4. Obesity Body mass index is 38.62 kg/m.  Return to see me in 3 months  Thompson Grayer MD, [facility] 02/10/2017 4:35 PM

## 2017-02-10 NOTE — Patient Instructions (Addendum)
Medication Instructions:  Your physician has recommended you make the following change in your medication:  1) Increase Aldactone to 25 mg twice daily   Labwork: Your physician recommends that you return for lab work today and repeat in 2 weeks.  Today BMP/Mag, in 2 weeks repeat BMP   Testing/Procedures: None ordered  Follow-Up: Your physician recommends that you schedule a follow-up appointment in: 3 months with Dr Rayann Heman   Any Other Special Instructions Will Be Listed Below (If Applicable).     If you need a refill on your cardiac medications before your next appointment, please call your pharmacy.

## 2017-02-11 LAB — BASIC METABOLIC PANEL
BUN / CREAT RATIO: 24 (ref 12–28)
BUN: 15 mg/dL (ref 8–27)
CO2: 24 mmol/L (ref 18–29)
Calcium: 9.5 mg/dL (ref 8.7–10.3)
Chloride: 97 mmol/L (ref 96–106)
Creatinine, Ser: 0.63 mg/dL (ref 0.57–1.00)
GFR, EST AFRICAN AMERICAN: 110 mL/min/{1.73_m2} (ref >59)
GFR, EST NON AFRICAN AMERICAN: 95 mL/min/{1.73_m2} (ref >59)
Glucose: 76 mg/dL (ref 65–99)
POTASSIUM: 4.9 mmol/L (ref 3.5–5.2)
SODIUM: 139 mmol/L (ref 134–144)

## 2017-02-11 LAB — MAGNESIUM: Magnesium: 2.1 mg/dL (ref 1.6–2.3)

## 2017-02-25 ENCOUNTER — Other Ambulatory Visit: Payer: BC Managed Care – PPO | Admitting: *Deleted

## 2017-02-25 DIAGNOSIS — I480 Paroxysmal atrial fibrillation: Secondary | ICD-10-CM

## 2017-02-25 LAB — BASIC METABOLIC PANEL
BUN/Creatinine Ratio: 26 (ref 12–28)
BUN: 22 mg/dL (ref 8–27)
CALCIUM: 9.7 mg/dL (ref 8.7–10.3)
CHLORIDE: 99 mmol/L (ref 96–106)
CO2: 23 mmol/L (ref 20–29)
Creatinine, Ser: 0.86 mg/dL (ref 0.57–1.00)
GFR calc non Af Amer: 72 mL/min/{1.73_m2} (ref >59)
GFR, EST AFRICAN AMERICAN: 83 mL/min/{1.73_m2} (ref >59)
Glucose: 129 mg/dL — ABNORMAL HIGH (ref 65–99)
Potassium: 5.2 mmol/L (ref 3.5–5.2)
Sodium: 140 mmol/L (ref 134–144)

## 2017-04-01 ENCOUNTER — Other Ambulatory Visit: Payer: Self-pay | Admitting: Internal Medicine

## 2017-04-15 ENCOUNTER — Other Ambulatory Visit: Payer: Self-pay | Admitting: Nurse Practitioner

## 2017-05-10 ENCOUNTER — Encounter: Payer: Self-pay | Admitting: Internal Medicine

## 2017-05-10 ENCOUNTER — Ambulatory Visit (INDEPENDENT_AMBULATORY_CARE_PROVIDER_SITE_OTHER): Payer: BC Managed Care – PPO | Admitting: Internal Medicine

## 2017-05-10 VITALS — BP 130/82 | HR 53 | Ht 64.0 in | Wt 226.8 lb

## 2017-05-10 DIAGNOSIS — R001 Bradycardia, unspecified: Secondary | ICD-10-CM | POA: Diagnosis not present

## 2017-05-10 DIAGNOSIS — I480 Paroxysmal atrial fibrillation: Secondary | ICD-10-CM | POA: Diagnosis not present

## 2017-05-10 NOTE — Patient Instructions (Addendum)
   Medication Instructions:  Your physician recommends that you continue on your current medications as directed. Please refer to the Current Medication list given to you today.   Labwork: Your physician recommends that you return for lab work today: BMP/CBC   Testing/Procedures: Your physician has requested that you have cardiac CT. Cardiac computed tomography (CT) is a painless test that uses an x-ray machine to take clear, detailed pictures of your heart. For further information please visit [email or web address]. Please follow instruction sheet as given.----week of 05/25/17 prior to afib ablation scheduled for 05/25/17.  Office will call you with date/time and instructions once insurance approves  Your physician has recommended that you have an ablation. Catheter ablation is a medical procedure used to treat some cardiac arrhythmias (irregular heartbeats). During catheter ablation, a long, thin, flexible tube is put into a blood vessel in your groin (upper thigh), or neck. This tube is called an ablation catheter. It is then guided to your heart through the blood vessel. Radio frequency waves destroy small areas of heart tissue where abnormal heartbeats may cause an arrhythmia to start. Please see the instruction sheet given to you today.---06/01/17  Please arrive at [facility] at 8:30am Do not eat or drink after midnight the night prior to the procedure Do not take any medications the morning of the test Plan for one night stay Will need someone to drive you home at discharge    Follow-Up: Your physician recommends that you schedule a follow-up appointment in: 4 weeks from 06/01/17 in afib clinic and 3 months from 06/01/17 with Dr Rayann Heman    Thank you for choosing [facility]!!     Janan Halter, RN [phone]

## 2017-05-10 NOTE — Progress Notes (Signed)
PCP: Aura Dials, MD  Primary EP: Dr Etheleen Sia is a 66 y.o. female who presents today for routine electrophysiology followup.  Since last being seen in our clinic, the patient reports doing reasoanbly well.  She feels that she is having afib more frequently.  Unfortunately, she has made no progress with lifestyle modification.  Today, she denies symptoms of chest pain, shortness of breath,  lower extremity edema, dizziness, presyncope, or syncope.  The patient is otherwise without complaint today.   Past Medical History:  Diagnosis Date  . Asthma   . Atrial fibrillation ([place])   . Dysrhythmia    atrial fibrillation  . HTN (hypertension)   . Hyperlipidemia   . Hypothyroidism   . Obesity   . Palpitation   . Walking pneumonia ~ 10/2014   Past Surgical History:  Procedure Laterality Date  . ABDOMINAL HYSTERECTOMY    . ANKLE FRACTURE SURGERY Right ?1999  . CARDIOVERSION  03/08/2012   Procedure: CARDIOVERSION;  Surgeon: Candee Furbish, MD;  Location: [place];  Service: Cardiovascular;  Laterality: N/A;  . CARDIOVERSION N/A 05/02/2015   Procedure: CARDIOVERSION;  Surgeon: Jerline Pain, MD;  Location: [place];  Service: Cardiovascular;  Laterality: N/A;  . CARDIOVERSION N/A 05/16/2015   Procedure: CARDIOVERSION;  Surgeon: Sueanne Margarita, MD;  Location: [facility] ENDOSCOPY;  Service: Cardiovascular;  Laterality: N/A;  . CARDIOVERSION N/A 08/14/2015   Procedure: CARDIOVERSION;  Surgeon: Josue Hector, MD;  Location: [facility] ENDOSCOPY;  Service: Cardiovascular;  Laterality: N/A;  . CARDIOVERSION N/A 09/13/2015   Procedure: CARDIOVERSION;  Surgeon: Dorothy Spark, MD;  Location: [place];  Service: Cardiovascular;  Laterality: N/A;  . DILATION AND CURETTAGE OF UTERUS    . ELECTROPHYSIOLOGIC STUDY N/A 07/12/2015   Procedure: Atrial Fibrillation Ablation;  Surgeon: Thompson Grayer, MD;  Location: [place] CV LAB;  Service: Cardiovascular;  Laterality: N/A;  . FRACTURE SURGERY      . TEE WITHOUT CARDIOVERSION N/A 07/11/2015   Procedure: TRANSESOPHAGEAL ECHOCARDIOGRAM (TEE);  Surgeon: Skeet Latch, MD;  Location: [facility] ENDOSCOPY;  Service: Cardiovascular;  Laterality: N/A;  . TONSILLECTOMY    . TUBAL LIGATION      ROS- all systems are reviewed and negatives except as per HPI above  Current Outpatient Prescriptions  Medication Sig Dispense Refill  . acetaminophen (TYLENOL) 500 MG tablet Take 500-1,000 mg by mouth every 6 (six) hours as needed for mild pain or moderate pain.     Marland Kitchen albuterol (PROVENTIL HFA;VENTOLIN HFA) 108 (90 BASE) MCG/ACT inhaler Inhale 2 puffs into the lungs every 4 (four) hours as needed for shortness of breath.     . dofetilide (TIKOSYN) 125 MCG capsule TAKE 3 CAPSULES (375 MCG TOTAL) BY MOUTH 2 (TWO) TIMES DAILY. 180 capsule 3  . levothyroxine (SYNTHROID, LEVOTHROID) 125 MCG tablet Take 125 mcg by mouth daily.    . magnesium oxide (MAG-OX) 400 MG tablet TAKE 1 TABLET BY MOUTH EVERY DAY 30 tablet 9  . Multiple Vitamin (MULTIVITAMIN) tablet Take 1 tablet by mouth daily. Women's Gummies     . Probiotic Product (PROBIOTIC PO) Take 1 tablet by mouth daily.    . rivaroxaban (XARELTO) 20 MG TABS tablet Take 1 tablet (20 mg total) by mouth daily with supper. 30 tablet 11  . spironolactone (ALDACTONE) 25 MG tablet Take 1 tablet (25 mg total) by mouth 2 (two) times daily. 180 tablet 3  . tolterodine (DETROL) 2 MG tablet Take 2 mg by mouth daily.    Marland Kitchen  triazolam (HALCION) 0.25 MG tablet Take 0.25 mg by mouth as directed. Take before Dental appts.  0  . valACYclovir (VALTREX) 1000 MG tablet Take 1,000 mg by mouth 2 (two) times daily as needed. Cold sore     No current facility-administered medications for this visit.     Physical Exam: Vitals:   05/10/17 1612  BP: 130/82  Pulse: (!) 53  SpO2: 96%  Weight: 226 lb 12.8 oz (102.9 kg)  Height: 5\' 4"  (1.626 m)   GEN- overweight alert and oriented x 3 today.   Head- normocephalic, atraumatic Eyes-   Sclera clear, conjunctiva pink Ears- hearing intact Oropharynx- clear Lungs- Clear to ausculation bilaterally, normal work of breathing Heart- Regular rate and rhythm, no murmurs, rubs or gallops, PMI not laterally displaced GI- soft, NT, ND, + BS Extremities- no clubbing, cyanosis, or edema  EKG tracing ordered today is personally reviewed and shows sinus rhythm 53 bpm, PR 182 msec, Qtc 459 msec  Assessment and Plan:  1. Persistent afib Continues to have episodes of afib despite tikosyn Therapeutic strategies for afib including medicine and repeat ablation were discussed in detail with the patient today. Risk, benefits, and alternatives to EP study and radiofrequency ablation for afib were also discussed in detail today. These risks include but are not limited to stroke, bleeding, vascular damage, tamponade, perforation, damage to the esophagus, lungs, and other structures, pulmonary vein stenosis, worsening renal function, and death. The patient understands these risk and wishes to proceed.  We will therefore proceed with catheter ablation at the next available time.  Will require Cardiac CT prior to ablation to evaluate for LAA thombus.  2. Sinus bradycardia Stable No change required today  3. HTN Stable No change required today  4. Obesity Body mass index is 38.93 kg/m. Lifestyle modification again discussed today  Thompson Grayer MD, [facility] 05/10/2017 4:20 PM

## 2017-05-11 ENCOUNTER — Other Ambulatory Visit: Payer: Self-pay | Admitting: Internal Medicine

## 2017-05-11 LAB — CBC WITH DIFFERENTIAL/PLATELET
BASOS ABS: 0 10*3/uL (ref 0.0–0.2)
Basos: 0 %
EOS (ABSOLUTE): 0.3 10*3/uL (ref 0.0–0.4)
Eos: 2 %
HEMOGLOBIN: 14.7 g/dL (ref 11.1–15.9)
Hematocrit: 43.9 % (ref 34.0–46.6)
Immature Grans (Abs): 0 10*3/uL (ref 0.0–0.1)
Immature Granulocytes: 0 %
LYMPHS ABS: 2.6 10*3/uL (ref 0.7–3.1)
LYMPHS: 21 %
MCH: 35.6 pg — ABNORMAL HIGH (ref 26.6–33.0)
MCHC: 33.5 g/dL (ref 31.5–35.7)
MCV: 106 fL — ABNORMAL HIGH (ref 79–97)
MONOCYTES: 9 %
Monocytes Absolute: 1.1 10*3/uL — ABNORMAL HIGH (ref 0.1–0.9)
Neutrophils Absolute: 8.4 10*3/uL — ABNORMAL HIGH (ref 1.4–7.0)
Neutrophils: 68 %
PLATELETS: 252 10*3/uL (ref 150–379)
RBC: 4.13 x10E6/uL (ref 3.77–5.28)
RDW: 14.7 % (ref 12.3–15.4)
WBC: 12.4 10*3/uL — AB (ref 3.4–10.8)

## 2017-05-11 LAB — BASIC METABOLIC PANEL
BUN/Creatinine Ratio: 23 (ref 12–28)
BUN: 19 mg/dL (ref 8–27)
CALCIUM: 9.6 mg/dL (ref 8.7–10.3)
CHLORIDE: 100 mmol/L (ref 96–106)
CO2: 24 mmol/L (ref 20–29)
Creatinine, Ser: 0.84 mg/dL (ref 0.57–1.00)
GFR calc Af Amer: 84 mL/min/{1.73_m2} (ref >59)
GFR calc non Af Amer: 73 mL/min/{1.73_m2} (ref >59)
GLUCOSE: 89 mg/dL (ref 65–99)
POTASSIUM: 4.8 mmol/L (ref 3.5–5.2)
Sodium: 139 mmol/L (ref 134–144)

## 2017-05-11 NOTE — Telephone Encounter (Signed)
Medication Detail    Disp Refills Start End   spironolactone (ALDACTONE) 25 MG tablet 180 tablet 3 02/10/2017    Sig - Route: Take 1 tablet (25 mg total) by mouth 2 (two) times daily. - Oral   Sent to pharmacy as: spironolactone (ALDACTONE) 25 MG tablet   E-Prescribing Status: Receipt confirmed by pharmacy (02/10/2017 4:48 PM EDT)   Pharmacy   [id number] - [place], [place] - [address] AT [address]

## 2017-05-12 ENCOUNTER — Encounter: Payer: Self-pay | Admitting: Internal Medicine

## 2017-05-14 ENCOUNTER — Ambulatory Visit (INDEPENDENT_AMBULATORY_CARE_PROVIDER_SITE_OTHER): Payer: BC Managed Care – PPO | Admitting: Podiatry

## 2017-05-14 ENCOUNTER — Ambulatory Visit (INDEPENDENT_AMBULATORY_CARE_PROVIDER_SITE_OTHER): Payer: BC Managed Care – PPO

## 2017-05-14 ENCOUNTER — Encounter: Payer: Self-pay | Admitting: Podiatry

## 2017-05-14 VITALS — BP 198/89 | HR 40 | Resp 16

## 2017-05-14 DIAGNOSIS — R269 Unspecified abnormalities of gait and mobility: Secondary | ICD-10-CM

## 2017-05-14 DIAGNOSIS — S93621A Sprain of tarsometatarsal ligament of right foot, initial encounter: Secondary | ICD-10-CM

## 2017-05-14 DIAGNOSIS — M79671 Pain in right foot: Secondary | ICD-10-CM | POA: Diagnosis not present

## 2017-05-14 DIAGNOSIS — S9031XA Contusion of right foot, initial encounter: Secondary | ICD-10-CM
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
## 2017-05-25 ENCOUNTER — Ambulatory Visit (HOSPITAL_COMMUNITY)
Admission: RE | Admit: 2017-05-25 | Discharge: 2017-05-25 | Disposition: A | Discharge status: Home / Self Care | Payer: BC Managed Care – PPO | Source: Ambulatory Visit | Attending: Internal Medicine | Admitting: Internal Medicine

## 2017-05-25 ENCOUNTER — Ambulatory Visit (HOSPITAL_COMMUNITY): Payer: BC Managed Care – PPO

## 2017-05-25 ENCOUNTER — Other Ambulatory Visit: Payer: Self-pay | Admitting: Cardiology

## 2017-05-25 DIAGNOSIS — I4891 Unspecified atrial fibrillation: Secondary | ICD-10-CM

## 2017-05-25 DIAGNOSIS — I480 Paroxysmal atrial fibrillation: Secondary | ICD-10-CM | POA: Insufficient documentation

## 2017-05-26 ENCOUNTER — Ambulatory Visit (HOSPITAL_COMMUNITY)
Admission: RE | Admit: 2017-05-26 | Discharge: 2017-05-26 | Disposition: A | Discharge status: Home / Self Care | Payer: BC Managed Care – PPO | Source: Ambulatory Visit | Attending: Cardiology | Admitting: Cardiology

## 2017-05-26 ENCOUNTER — Encounter (HOSPITAL_COMMUNITY): Payer: Self-pay

## 2017-05-26 ENCOUNTER — Other Ambulatory Visit: Payer: Self-pay | Admitting: Cardiology

## 2017-05-26 ENCOUNTER — Ambulatory Visit (HOSPITAL_COMMUNITY)
Admission: RE | Admit: 2017-05-26 | Discharge: 2017-05-26 | Disposition: A | Discharge status: Home / Self Care | Payer: BC Managed Care – PPO | Source: Ambulatory Visit | Attending: Internal Medicine | Admitting: Internal Medicine

## 2017-05-26 DIAGNOSIS — Z87891 Personal history of nicotine dependence: Secondary | ICD-10-CM | POA: Diagnosis not present

## 2017-05-26 DIAGNOSIS — E785 Hyperlipidemia, unspecified: Secondary | ICD-10-CM | POA: Diagnosis not present

## 2017-05-26 DIAGNOSIS — Z888 Allergy status to other drugs, medicaments and biological substances status: Secondary | ICD-10-CM | POA: Diagnosis not present

## 2017-05-26 DIAGNOSIS — Z6838 Body mass index (BMI) 38.0-38.9, adult: Secondary | ICD-10-CM | POA: Diagnosis not present

## 2017-05-26 DIAGNOSIS — I878 Other specified disorders of veins: Secondary | ICD-10-CM | POA: Insufficient documentation

## 2017-05-26 DIAGNOSIS — Z7901 Long term (current) use of anticoagulants: Secondary | ICD-10-CM | POA: Diagnosis not present

## 2017-05-26 DIAGNOSIS — I10 Essential (primary) hypertension: Secondary | ICD-10-CM | POA: Diagnosis not present

## 2017-05-26 DIAGNOSIS — I4891 Unspecified atrial fibrillation: Secondary | ICD-10-CM

## 2017-05-26 DIAGNOSIS — I480 Paroxysmal atrial fibrillation: Secondary | ICD-10-CM

## 2017-05-26 DIAGNOSIS — Z880 Allergy status to penicillin: Secondary | ICD-10-CM | POA: Diagnosis not present

## 2017-05-26 DIAGNOSIS — E669 Obesity, unspecified: Secondary | ICD-10-CM | POA: Diagnosis not present

## 2017-05-26 DIAGNOSIS — I481 Persistent atrial fibrillation: Secondary | ICD-10-CM | POA: Diagnosis not present

## 2017-05-26 DIAGNOSIS — I495 Sick sinus syndrome: Secondary | ICD-10-CM | POA: Diagnosis not present

## 2017-05-26 DIAGNOSIS — E039 Hypothyroidism, unspecified: Secondary | ICD-10-CM | POA: Diagnosis not present

## 2017-05-26 HISTORY — PX: IR RADIOLOGY PERIPHERAL GUIDED IV START: IMG5598

## 2017-05-26 HISTORY — PX: IR US GUIDE VASC ACCESS RIGHT: IMG2390

## 2017-05-26 MED ORDER — IOPAMIDOL (ISOVUE-370) INJECTION 76%
80.0000 mL | Freq: Once | INTRAVENOUS | Status: AC | PRN
  Administered 2017-05-26: 80 mL via INTRAVENOUS

## 2017-05-26 MED ORDER — IOPAMIDOL (ISOVUE-370) INJECTION 76%
INTRAVENOUS | Status: AC
  Filled 2017-05-26: qty 100

## 2017-05-26 MED ORDER — NITROGLYCERIN 0.4 MG SL SUBL
0.4000 mg | SUBLINGUAL_TABLET | Freq: Once | SUBLINGUAL | Status: AC
  Administered 2017-05-26: 0.4 mg via SUBLINGUAL
  Filled 2017-05-26: qty 25

## 2017-05-26 MED ORDER — NITROGLYCERIN 0.4 MG SL SUBL
SUBLINGUAL_TABLET | SUBLINGUAL | Status: AC
  Filled 2017-05-26: qty 1

## 2017-05-26 MED ORDER — LIDOCAINE HCL (PF) 1 % IJ SOLN
INTRAMUSCULAR | Status: AC
  Filled 2017-05-26: qty 30

## 2017-05-26 NOTE — Progress Notes (Signed)
Micropuncture that was started in IR was d/c. Pressure held for 5 minutes. Tip of catheter present

## 2017-05-27 ENCOUNTER — Ambulatory Visit: Payer: BC Managed Care – PPO | Admitting: Podiatry

## 2017-06-01 ENCOUNTER — Ambulatory Visit (HOSPITAL_COMMUNITY): Payer: BC Managed Care – PPO | Admitting: Certified Registered Nurse Anesthetist

## 2017-06-01 ENCOUNTER — Ambulatory Visit (HOSPITAL_COMMUNITY)
Admission: RE | Admit: 2017-06-01 | Discharge: 2017-06-02 | Disposition: A | Discharge status: Home / Self Care | Payer: BC Managed Care – PPO | Source: Ambulatory Visit | Attending: Internal Medicine | Admitting: Internal Medicine

## 2017-06-01 ENCOUNTER — Encounter (HOSPITAL_COMMUNITY)
Admission: RE | Disposition: A | Discharge status: Home / Self Care | Payer: Self-pay | Source: Ambulatory Visit | Attending: Internal Medicine

## 2017-06-01 ENCOUNTER — Encounter (HOSPITAL_COMMUNITY): Payer: Self-pay | Admitting: Certified Registered Nurse Anesthetist

## 2017-06-01 DIAGNOSIS — E785 Hyperlipidemia, unspecified: Secondary | ICD-10-CM | POA: Insufficient documentation

## 2017-06-01 DIAGNOSIS — I4819 Other persistent atrial fibrillation: Secondary | ICD-10-CM | POA: Diagnosis present

## 2017-06-01 DIAGNOSIS — Z7901 Long term (current) use of anticoagulants: Secondary | ICD-10-CM | POA: Insufficient documentation

## 2017-06-01 DIAGNOSIS — Z888 Allergy status to other drugs, medicaments and biological substances status: Secondary | ICD-10-CM | POA: Insufficient documentation

## 2017-06-01 DIAGNOSIS — I495 Sick sinus syndrome: Secondary | ICD-10-CM | POA: Insufficient documentation

## 2017-06-01 DIAGNOSIS — I481 Persistent atrial fibrillation: Secondary | ICD-10-CM | POA: Diagnosis not present

## 2017-06-01 DIAGNOSIS — Z6838 Body mass index (BMI) 38.0-38.9, adult: Secondary | ICD-10-CM | POA: Insufficient documentation

## 2017-06-01 DIAGNOSIS — E669 Obesity, unspecified: Secondary | ICD-10-CM | POA: Insufficient documentation

## 2017-06-01 DIAGNOSIS — Z880 Allergy status to penicillin: Secondary | ICD-10-CM | POA: Insufficient documentation

## 2017-06-01 DIAGNOSIS — Z87891 Personal history of nicotine dependence: Secondary | ICD-10-CM | POA: Insufficient documentation

## 2017-06-01 DIAGNOSIS — E039 Hypothyroidism, unspecified: Secondary | ICD-10-CM | POA: Insufficient documentation

## 2017-06-01 DIAGNOSIS — I10 Essential (primary) hypertension: Secondary | ICD-10-CM | POA: Insufficient documentation

## 2017-06-01 HISTORY — PX: ATRIAL FIBRILLATION ABLATION: EP1191

## 2017-06-01 LAB — BASIC METABOLIC PANEL
Anion gap: 6 (ref 5–15)
BUN: 18 mg/dL (ref 6–20)
CALCIUM: 9.7 mg/dL (ref 8.9–10.3)
CO2: 26 mmol/L (ref 22–32)
Chloride: 105 mmol/L (ref 101–111)
Creatinine, Ser: 0.86 mg/dL (ref 0.44–1.00)
GFR calc Af Amer: >60 mL/min (ref >60)
GLUCOSE: 111 mg/dL — AB (ref 65–99)
Potassium: 4.7 mmol/L (ref 3.5–5.1)
SODIUM: 137 mmol/L (ref 135–145)

## 2017-06-01 LAB — CBC
HCT: 47.7 % — ABNORMAL HIGH (ref 36.0–46.0)
Hemoglobin: 16 g/dL — ABNORMAL HIGH (ref 12.0–15.0)
MCH: 35.7 pg — ABNORMAL HIGH (ref 26.0–34.0)
MCHC: 33.5 g/dL (ref 30.0–36.0)
MCV: 106.5 fL — AB (ref 78.0–100.0)
PLATELETS: 239 10*3/uL (ref 150–400)
RBC: 4.48 MIL/uL (ref 3.87–5.11)
RDW: 13.4 % (ref 11.5–15.5)
WBC: 9.8 10*3/uL (ref 4.0–10.5)

## 2017-06-01 LAB — POCT ACTIVATED CLOTTING TIME: Activated Clotting Time: 169 seconds

## 2017-06-01 SURGERY — ATRIAL FIBRILLATION ABLATION
Anesthesia: General

## 2017-06-01 MED ORDER — IOPAMIDOL (ISOVUE-370) INJECTION 76%
INTRAVENOUS | Status: DC | PRN
  Administered 2017-06-01: 3 mL via INTRAVENOUS

## 2017-06-01 MED ORDER — LIDOCAINE 2% (20 MG/ML) 5 ML SYRINGE
INTRAMUSCULAR | Status: DC | PRN
  Administered 2017-06-01: 80 mg via INTRAVENOUS

## 2017-06-01 MED ORDER — ONDANSETRON HCL 4 MG/2ML IJ SOLN: 4.0000 mg | Freq: Four times a day (QID) | INTRAMUSCULAR | Status: DC | PRN

## 2017-06-01 MED ORDER — LIDOCAINE HCL (PF) 1 % IJ SOLN
INTRAMUSCULAR | Status: DC | PRN
  Administered 2017-06-01: 20 mL

## 2017-06-01 MED ORDER — ADENOSINE 6 MG/2ML IV SOLN
INTRAVENOUS | Status: AC
  Filled 2017-06-01: qty 2

## 2017-06-01 MED ORDER — HEPARIN SODIUM (PORCINE) 1000 UNIT/ML IJ SOLN
INTRAMUSCULAR | Status: DC | PRN
  Administered 2017-06-01: 3000 [IU] via INTRAVENOUS
  Administered 2017-06-01: 2000 [IU] via INTRAVENOUS

## 2017-06-01 MED ORDER — SODIUM CHLORIDE 0.9 % IV SOLN
INTRAVENOUS | Status: DC
  Administered 2017-06-01: 09:00:00 via INTRAVENOUS

## 2017-06-01 MED ORDER — PHENYLEPHRINE HCL 10 MG/ML IJ SOLN
INTRAVENOUS | Status: DC | PRN
  Administered 2017-06-01: 20 ug/min via INTRAVENOUS

## 2017-06-01 MED ORDER — PHENYLEPHRINE 40 MCG/ML (10ML) SYRINGE FOR IV PUSH (FOR BLOOD PRESSURE SUPPORT): PREFILLED_SYRINGE | INTRAVENOUS | Status: DC | PRN

## 2017-06-01 MED ORDER — IOPAMIDOL (ISOVUE-370) INJECTION 76%
INTRAVENOUS | Status: AC
  Filled 2017-06-01: qty 50

## 2017-06-01 MED ORDER — DOFETILIDE 250 MCG PO CAPS
375.0000 ug | ORAL_CAPSULE | Freq: Two times a day (BID) | ORAL | Status: DC
  Administered 2017-06-01 – 2017-06-02 (×2): 375 ug via ORAL
  Filled 2017-06-01 (×2): qty 1

## 2017-06-01 MED ORDER — PROPOFOL 10 MG/ML IV BOLUS
INTRAVENOUS | Status: DC | PRN
  Administered 2017-06-01: 50 mg via INTRAVENOUS
  Administered 2017-06-01: 20 mg via INTRAVENOUS
  Administered 2017-06-01: 150 mg via INTRAVENOUS
  Administered 2017-06-01: 20 mg via INTRAVENOUS

## 2017-06-01 MED ORDER — ADENOSINE 6 MG/2ML IV SOLN
INTRAVENOUS | Status: AC
Start: 2017-06-01 — End: ?
  Filled 2017-06-01: qty 2

## 2017-06-01 MED ORDER — SPIRONOLACTONE 25 MG PO TABS: 25.0000 mg | ORAL_TABLET | Freq: Two times a day (BID) | ORAL | Status: DC

## 2017-06-01 MED ORDER — SUGAMMADEX SODIUM 200 MG/2ML IV SOLN
INTRAVENOUS | Status: DC | PRN
  Administered 2017-06-01: 200 mg via INTRAVENOUS

## 2017-06-01 MED ORDER — FENTANYL CITRATE (PF) 100 MCG/2ML IJ SOLN
INTRAMUSCULAR | Status: DC | PRN
  Administered 2017-06-01 (×4): 50 ug via INTRAVENOUS

## 2017-06-01 MED ORDER — HEPARIN SODIUM (PORCINE) 1000 UNIT/ML IJ SOLN
INTRAMUSCULAR | Status: AC
Start: 2017-06-01 — End: ?
  Filled 2017-06-01: qty 1

## 2017-06-01 MED ORDER — ACETAMINOPHEN 325 MG PO TABS
650.0000 mg | ORAL_TABLET | ORAL | Status: DC | PRN
  Administered 2017-06-02: 650 mg via ORAL
  Filled 2017-06-01: qty 2

## 2017-06-01 MED ORDER — SUCCINYLCHOLINE CHLORIDE 20 MG/ML IJ SOLN: INTRAMUSCULAR | Status: DC | PRN

## 2017-06-01 MED ORDER — ISOPROTERENOL HCL 0.2 MG/ML IJ SOLN
INTRAVENOUS | Status: DC | PRN
  Administered 2017-06-01: 20 ug/min via INTRAVENOUS

## 2017-06-01 MED ORDER — PROTAMINE SULFATE 10 MG/ML IV SOLN
INTRAVENOUS | Status: DC | PRN
  Administered 2017-06-01 (×3): 10 mg via INTRAVENOUS

## 2017-06-01 MED ORDER — ROCURONIUM BROMIDE 10 MG/ML (PF) SYRINGE
PREFILLED_SYRINGE | INTRAVENOUS | Status: DC | PRN
  Administered 2017-06-01: 20 mg via INTRAVENOUS
  Administered 2017-06-01: 10 mg via INTRAVENOUS
  Administered 2017-06-01: 30 mg via INTRAVENOUS
  Administered 2017-06-01: 10 mg via INTRAVENOUS

## 2017-06-01 MED ORDER — SUCCINYLCHOLINE CHLORIDE 200 MG/10ML IV SOSY
PREFILLED_SYRINGE | INTRAVENOUS | Status: DC | PRN
  Administered 2017-06-01: 140 mg via INTRAVENOUS

## 2017-06-01 MED ORDER — MAGNESIUM OXIDE 400 (241.3 MG) MG PO TABS: 400.0000 mg | ORAL_TABLET | Freq: Every day | ORAL | Status: DC

## 2017-06-01 MED ORDER — HYDROCODONE-ACETAMINOPHEN 5-325 MG PO TABS
1.0000 | ORAL_TABLET | ORAL | Status: DC | PRN
  Administered 2017-06-01: 2 via ORAL
  Filled 2017-06-01: qty 2

## 2017-06-01 MED ORDER — DOFETILIDE 250 MCG PO CAPS: 375.0000 ug | ORAL_CAPSULE | Freq: Two times a day (BID) | ORAL | Status: DC

## 2017-06-01 MED ORDER — SODIUM CHLORIDE 0.9 % IV SOLN
INTRAVENOUS | Status: DC | PRN
  Administered 2017-06-01 (×2): via INTRAVENOUS

## 2017-06-01 MED ORDER — SODIUM CHLORIDE 0.9% FLUSH
3.0000 mL | Freq: Two times a day (BID) | INTRAVENOUS | Status: DC
  Administered 2017-06-01: 3 mL via INTRAVENOUS

## 2017-06-01 MED ORDER — RIVAROXABAN 20 MG PO TABS
20.0000 mg | ORAL_TABLET | Freq: Every day | ORAL | Status: DC
  Administered 2017-06-01: 20 mg via ORAL
  Filled 2017-06-01: qty 1

## 2017-06-01 MED ORDER — ISOPROTERENOL HCL 0.2 MG/ML IJ SOLN
INTRAMUSCULAR | Status: AC
  Filled 2017-06-01: qty 5

## 2017-06-01 MED ORDER — SODIUM CHLORIDE 0.9% FLUSH: 3.0000 mL | INTRAVENOUS | Status: DC | PRN

## 2017-06-01 MED ORDER — LIDOCAINE HCL (PF) 1 % IJ SOLN
INTRAMUSCULAR | Status: AC
  Filled 2017-06-01: qty 30

## 2017-06-01 MED ORDER — ONDANSETRON HCL 4 MG/2ML IJ SOLN
INTRAMUSCULAR | Status: DC | PRN
Start: 2017-06-01 — End: 2017-06-01
  Administered 2017-06-01: 4 mg via INTRAVENOUS

## 2017-06-01 MED ORDER — LEVOTHYROXINE SODIUM 25 MCG PO TABS
125.0000 ug | ORAL_TABLET | Freq: Every day | ORAL | Status: DC
  Administered 2017-06-02: 125 ug via ORAL
  Filled 2017-06-01: qty 1

## 2017-06-01 MED ORDER — HEPARIN SODIUM (PORCINE) 1000 UNIT/ML IJ SOLN
INTRAMUSCULAR | Status: DC | PRN
  Administered 2017-06-01: 1000 [IU] via INTRAVENOUS

## 2017-06-01 MED ORDER — SODIUM CHLORIDE 0.9 % IV SOLN: 250.0000 mL | INTRAVENOUS | Status: DC | PRN

## 2017-06-01 MED ORDER — MIDAZOLAM HCL 5 MG/5ML IJ SOLN
INTRAMUSCULAR | Status: DC | PRN
  Administered 2017-06-01: 2 mg via INTRAVENOUS

## 2017-06-01 SURGICAL SUPPLY — 18 items
BAG SNAP BAND KOVER 36X36 (MISCELLANEOUS) ×3 IMPLANT
BLANKET WARM UNDERBOD FULL ACC (MISCELLANEOUS) ×3 IMPLANT
CATH MAPPNG PENTARAY F 2-6-2MM (CATHETERS) ×1 IMPLANT
CATH SMTCH THERMOCOOL SF DF (CATHETERS) ×3 IMPLANT
CATH SOUNDSTAR ECO REPROCESSED (CATHETERS) ×3 IMPLANT
CATH WEBSTER BI DIR CS D-F CRV (CATHETERS) ×3 IMPLANT
COVER SWIFTLINK CONNECTOR (BAG) ×3 IMPLANT
NEEDLE TRANSEP BRK 71CM 407200 (NEEDLE) ×3 IMPLANT
PACK EP LATEX FREE (CUSTOM PROCEDURE TRAY) ×2
PACK EP LF (CUSTOM PROCEDURE TRAY) ×1 IMPLANT
PAD DEFIB LIFELINK (PAD) ×3 IMPLANT
PATCH CARTO3 (PAD) ×3 IMPLANT
PENTARAY F 2-6-2MM (CATHETERS) ×3
SHEATH AVANTI 11F 11CM (SHEATH) ×3 IMPLANT
SHEATH PINNACLE 7F 10CM (SHEATH) ×6 IMPLANT
SHEATH PINNACLE 9F 10CM (SHEATH) ×3 IMPLANT
SHEATH SWARTZ TS SL2 63CM 8.5F (SHEATH) ×3 IMPLANT
TUBING SMART ABLATE COOLFLOW (TUBING) ×3 IMPLANT

## 2017-06-01 NOTE — Interval H&P Note (Signed)
History and Physical Interval Note:  06/01/2017 10:34 AM  Carrie Potter  has presented today for surgery, with the diagnosis of afib  The various methods of treatment have been discussed with the patient and family. After consideration of risks, benefits and other options for treatment, the patient has consented to  Procedure(s): Atrial Fibrillation Ablation (N/A) as a surgical intervention .  The patient's history has been reviewed, patient examined, no change in status, stable for surgery.  I have reviewed the patient's chart and labs.  Questions were answered to the patient's satisfaction.     Thompson Grayer

## 2017-06-01 NOTE — Transfer of Care (Signed)
Immediate Anesthesia Transfer of Care Note  Patient: Carrie Potter  Procedure(s) Performed: Procedure(s): Atrial Fibrillation Ablation (N/A)  Patient Location: Cath Lab  Anesthesia Type:General  Level of Consciousness: awake, alert  and oriented  Airway & Oxygen Therapy: Patient Spontanous Breathing and Patient connected to nasal cannula oxygen  Post-op Assessment: Report given to RN, Post -op Vital signs reviewed and stable and Patient moving all extremities X 4  Post vital signs: Reviewed and stable  Last Vitals:  Vitals:   06/01/17 0837 06/01/17 1034  Pulse: (!) 45   Resp: 18   Temp: 36.9 C (!) 36.3 C  SpO2: 92%     Last Pain:  Vitals:   06/01/17 1034  TempSrc: Temporal         Complications: No apparent anesthesia complications
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
## 2017-06-01 NOTE — Discharge Summary (Signed)
ELECTROPHYSIOLOGY PROCEDURE DISCHARGE SUMMARY    Patient ID: Carrie Potter,  MRN: [id number], DOB/AGE: 65/21/1952 65 y.o.  Admit date: 06/01/2017 Discharge date: 06/02/2017  Primary Care Physician: Aura Dials, MD Electrophysiologist: Thompson Grayer, MD  Primary Discharge Diagnosis:  Persistent atrial fibrillation status post ablation this admission  Secondary Discharge Diagnosis:  1.  Sinus bradycardia/sick sinus syndrome 2.  HTN 3.  Obesity  Procedures This Admission:  1.  Electrophysiology study and radiofrequency catheter ablation on 06/01/17 by Dr Thompson Grayer.  This study demonstrated sinus rhythm upon presentation; the left superior and left inferior pulmonary veins were quiescent from a prior ablation procedure.  Return of electrical activity within the right inferior and superior pulmonary veins at baseline. The patient underwent successful electrical re-isolation of the right pulmonary veins.  Minor ablation was performed along the anterior carina of the left PVs; additional left atrial ablation was performed with a standard box lesion created along the posterior wall of the left atrium; atrial fibrillation successfully cardioverted to sinus rhythm; no early apparent complications..    Brief HPI: Carrie Potter is a 66 y.o. female with a history of persistent atrial fibrillation.  They have failed medical therapy with Tikosyn. Risks, benefits, and alternatives to catheter ablation of atrial fibrillation were reviewed with the patient who wished to proceed.  The patient underwent cardiac CT prior to the procedure which demonstrated no LAA thrombus and no PV stenosis from prior ablation procedure.    Hospital Course:  The patient was admitted and underwent EPS/RFCA of atrial fibrillation with details as outlined above.  They were monitored on telemetry overnight which demonstrated sinus rhythm, sinus bradycardia.  Groin was without complication on the day of discharge.  The  patient was examined and considered to be stable for discharge.  Wound care and restrictions were reviewed with the patient.  The patient will be seen back by Roderic Palau, NP in 4 weeks and Dr Rayann Heman in 12 weeks for post ablation follow up.   This patients CHA2DS2-VASc Score and unadjusted Ischemic Stroke Rate (% per year) is equal to 3.2 % stroke rate/year from a score of 3 Above score calculated as 1 point each if present [CHF, HTN, DM, Vascular=MI/PAD/Aortic Plaque, Age if 65-74, or Female] Above score calculated as 2 points each if present [Age > 75, or Stroke/TIA/TE]   Physical Exam: Vitals:   06/01/17 1943 06/02/17 0003 06/02/17 0519 06/02/17 0736  BP: (!) 139/50 (!) 136/43 (!) 116/45 (!) 95/46  Pulse: (!) 51 (!) 43 (!) 43 (!) 40  Resp: 17 18 18 20   Temp: 98.5 F (36.9 C) 98.4 F (36.9 C) 98.5 F (36.9 C) 98.4 F (36.9 C)  TempSrc: Oral Oral Oral Oral  SpO2: 98% 98% 98% 99%  Weight:   224 lb 6.4 oz (101.8 kg)   Height:        GEN- The patient is well appearing, alert and oriented x 3 today.   HEENT: normocephalic, atraumatic; sclera clear, conjunctiva pink; hearing intact; oropharynx clear; neck supple  Lungs- Clear to ausculation bilaterally, normal work of breathing.  No wheezes, rales, rhonchi Heart- Regular rate and rhythm, no murmurs, rubs or gallops  GI- soft, non-tender, non-distended, bowel sounds present  Extremities- no clubbing, cyanosis, or edema; DP/PT/radial pulses 2+ bilaterally, groin without hematoma/bruit MS- no significant deformity or atrophy Skin- warm and dry, no rash or lesion Psych- euthymic mood, full affect Neuro- strength and sensation are intact   Labs:   Lab Results  Component Value Date   WBC 9.8 06/01/2017   HGB 16.0 (H) 06/01/2017   HCT 47.7 (H) 06/01/2017   MCV 106.5 (H) 06/01/2017   PLT 239 06/01/2017     Recent Labs Lab 06/02/17 0406  NA 137  K 4.7  CL 105  CO2 27  BUN 12  CREATININE 0.97  CALCIUM 8.8*  GLUCOSE 140*       Discharge Medications:  Allergies as of 06/02/2017      Reactions   Lisinopril Swelling   Penicillins Hives   Has patient had a PCN reaction causing immediate rash, facial/tongue/throat swelling, SOB or lightheadedness with hypotension: Yes Has patient had a PCN reaction causing severe rash involving mucus membranes or skin necrosis: No Has patient had a PCN reaction that required hospitalization No Has patient had a PCN reaction occurring within the last 10 years: No If all of the above answers are "NO", then may proceed with Cephalosporin use.   Diltiazem Rash   Norvasc [amlodipine] Rash      Medication List    TAKE these medications   acetaminophen 500 MG tablet Commonly known as:  TYLENOL Take 500-1,000 mg by mouth every 6 (six) hours as needed for mild pain or moderate pain.   albuterol 108 (90 Base) MCG/ACT inhaler Commonly known as:  PROVENTIL HFA;VENTOLIN HFA Inhale 2 puffs into the lungs every 4 (four) hours as needed for shortness of breath.   dofetilide 125 MCG capsule Commonly known as:  TIKOSYN TAKE 3 CAPSULES (375 MCG TOTAL) BY MOUTH 2 (TWO) TIMES DAILY.   levothyroxine 125 MCG tablet Commonly known as:  SYNTHROID, LEVOTHROID Take 125 mcg by mouth daily.   magnesium oxide 400 MG tablet Commonly known as:  MAG-OX TAKE 1 TABLET BY MOUTH EVERY DAY   multivitamin tablet Take 1 tablet by mouth daily. Women's Gummies   PROBIOTIC PO Take 1 tablet by mouth daily.   rivaroxaban 20 MG Tabs tablet Commonly known as:  XARELTO Take 1 tablet (20 mg total) by mouth daily with supper.   spironolactone 25 MG tablet Commonly known as:  ALDACTONE Take 1 tablet (25 mg total) by mouth 2 (two) times daily.   tolterodine 2 MG tablet Commonly known as:  DETROL Take 2 mg by mouth daily.   triazolam 0.25 MG tablet Commonly known as:  HALCION Take 0.25 mg by mouth as directed. Take before Dental appts.   valACYclovir 1000 MG tablet Commonly known as:   VALTREX Take 1,000 mg by mouth 2 (two) times daily as needed. Cold sore            Discharge Care Instructions        Start     Ordered   06/02/17 0000  Increase activity slowly     06/02/17 0750   06/02/17 0000  Diet - low sodium heart healthy     06/02/17 0750      Disposition:  Discharge Instructions    Diet - low sodium heart healthy    Complete by:  As directed    Increase activity slowly    Complete by:  As directed      Follow-up Information    [facility] Follow up on 06/29/2017.   Specialty:  Cardiology Why:  at 11:30AM Contact information: [address] [id number] [place] [address] [phone]       Thompson Grayer, MD Follow up on 09/01/2017.   Specialty:  Cardiology Why:  at 12noon  Contact information: [address] [place] [address] [phone]           Duration of Discharge Encounter: Greater than 30 minutes including physician time.  Signed, Chanetta Marshall, NP 06/02/2017 7:50 AM   I have seen, examined the patient, and reviewed the above assessment and plan.  On exam bradycardic regular rhythm.  Changes to above are made where necessary.   DC to home.   Continue current medicines.  Co Sign: Thompson Grayer, MD 06/02/2017 10:15 AM

## 2017-06-01 NOTE — H&P (View-Only) (Signed)
PCP: Aura Dials, MD  Primary EP: Dr Etheleen Sia is a 66 y.o. female who presents today for routine electrophysiology followup.  Since last being seen in our clinic, the patient reports doing reasoanbly well.  She feels that she is having afib more frequently.  Unfortunately, she has made no progress with lifestyle modification.  Today, she denies symptoms of chest pain, shortness of breath,  lower extremity edema, dizziness, presyncope, or syncope.  The patient is otherwise without complaint today.   Past Medical History:  Diagnosis Date  . Asthma   . Atrial fibrillation ([place])   . Dysrhythmia    atrial fibrillation  . HTN (hypertension)   . Hyperlipidemia   . Hypothyroidism   . Obesity   . Palpitation   . Walking pneumonia ~ 10/2014   Past Surgical History:  Procedure Laterality Date  . ABDOMINAL HYSTERECTOMY    . ANKLE FRACTURE SURGERY Right ?1999  . CARDIOVERSION  03/08/2012   Procedure: CARDIOVERSION;  Surgeon: Candee Furbish, MD;  Location: [place];  Service: Cardiovascular;  Laterality: N/A;  . CARDIOVERSION N/A 05/02/2015   Procedure: CARDIOVERSION;  Surgeon: Jerline Pain, MD;  Location: [place];  Service: Cardiovascular;  Laterality: N/A;  . CARDIOVERSION N/A 05/16/2015   Procedure: CARDIOVERSION;  Surgeon: Sueanne Margarita, MD;  Location: [facility] ENDOSCOPY;  Service: Cardiovascular;  Laterality: N/A;  . CARDIOVERSION N/A 08/14/2015   Procedure: CARDIOVERSION;  Surgeon: Josue Hector, MD;  Location: [facility] ENDOSCOPY;  Service: Cardiovascular;  Laterality: N/A;  . CARDIOVERSION N/A 09/13/2015   Procedure: CARDIOVERSION;  Surgeon: Dorothy Spark, MD;  Location: [place];  Service: Cardiovascular;  Laterality: N/A;  . DILATION AND CURETTAGE OF UTERUS    . ELECTROPHYSIOLOGIC STUDY N/A 07/12/2015   Procedure: Atrial Fibrillation Ablation;  Surgeon: Thompson Grayer, MD;  Location: [place] CV LAB;  Service: Cardiovascular;  Laterality: N/A;  . FRACTURE SURGERY      . TEE WITHOUT CARDIOVERSION N/A 07/11/2015   Procedure: TRANSESOPHAGEAL ECHOCARDIOGRAM (TEE);  Surgeon: Skeet Latch, MD;  Location: [facility] ENDOSCOPY;  Service: Cardiovascular;  Laterality: N/A;  . TONSILLECTOMY    . TUBAL LIGATION      ROS- all systems are reviewed and negatives except as per HPI above  Current Outpatient Prescriptions  Medication Sig Dispense Refill  . acetaminophen (TYLENOL) 500 MG tablet Take 500-1,000 mg by mouth every 6 (six) hours as needed for mild pain or moderate pain.     Marland Kitchen albuterol (PROVENTIL HFA;VENTOLIN HFA) 108 (90 BASE) MCG/ACT inhaler Inhale 2 puffs into the lungs every 4 (four) hours as needed for shortness of breath.     . dofetilide (TIKOSYN) 125 MCG capsule TAKE 3 CAPSULES (375 MCG TOTAL) BY MOUTH 2 (TWO) TIMES DAILY. 180 capsule 3  . levothyroxine (SYNTHROID, LEVOTHROID) 125 MCG tablet Take 125 mcg by mouth daily.    . magnesium oxide (MAG-OX) 400 MG tablet TAKE 1 TABLET BY MOUTH EVERY DAY 30 tablet 9  . Multiple Vitamin (MULTIVITAMIN) tablet Take 1 tablet by mouth daily. Women's Gummies     . Probiotic Product (PROBIOTIC PO) Take 1 tablet by mouth daily.    . rivaroxaban (XARELTO) 20 MG TABS tablet Take 1 tablet (20 mg total) by mouth daily with supper. 30 tablet 11  . spironolactone (ALDACTONE) 25 MG tablet Take 1 tablet (25 mg total) by mouth 2 (two) times daily. 180 tablet 3  . tolterodine (DETROL) 2 MG tablet Take 2 mg by mouth daily.    Marland Kitchen  triazolam (HALCION) 0.25 MG tablet Take 0.25 mg by mouth as directed. Take before Dental appts.  0  . valACYclovir (VALTREX) 1000 MG tablet Take 1,000 mg by mouth 2 (two) times daily as needed. Cold sore     No current facility-administered medications for this visit.     Physical Exam: Vitals:   05/10/17 1612  BP: 130/82  Pulse: (!) 53  SpO2: 96%  Weight: 226 lb 12.8 oz (102.9 kg)  Height: 5\' 4"  (1.626 m)   GEN- overweight alert and oriented x 3 today.   Head- normocephalic, atraumatic Eyes-   Sclera clear, conjunctiva pink Ears- hearing intact Oropharynx- clear Lungs- Clear to ausculation bilaterally, normal work of breathing Heart- Regular rate and rhythm, no murmurs, rubs or gallops, PMI not laterally displaced GI- soft, NT, ND, + BS Extremities- no clubbing, cyanosis, or edema  EKG tracing ordered today is personally reviewed and shows sinus rhythm 53 bpm, PR 182 msec, Qtc 459 msec  Assessment and Plan:  1. Persistent afib Continues to have episodes of afib despite tikosyn Therapeutic strategies for afib including medicine and repeat ablation were discussed in detail with the patient today. Risk, benefits, and alternatives to EP study and radiofrequency ablation for afib were also discussed in detail today. These risks include but are not limited to stroke, bleeding, vascular damage, tamponade, perforation, damage to the esophagus, lungs, and other structures, pulmonary vein stenosis, worsening renal function, and death. The patient understands these risk and wishes to proceed.  We will therefore proceed with catheter ablation at the next available time.  Will require Cardiac CT prior to ablation to evaluate for LAA thombus.  2. Sinus bradycardia Stable No change required today  3. HTN Stable No change required today  4. Obesity Body mass index is 38.93 kg/m. Lifestyle modification again discussed today  Thompson Grayer MD, [facility] 05/10/2017 4:20 PM

## 2017-06-01 NOTE — Progress Notes (Addendum)
Site area: RFV x 3 Site Prior to Removal:  Level 0 Pressure Applied For:20 min Manual: yes   Patient Status During Pull:  stable Post Pull Site:  Level 0 Post Pull Instructions Given:  yes Post Pull Pulses Present: palpable Dressing Applied:  tegaderm Bedrest begins @ 2706 till 2110 Comments:

## 2017-06-01 NOTE — Anesthesia Procedure Notes (Signed)
Procedure Name: Intubation Date/Time: 06/01/2017 11:18 AM Performed by: Garrison Columbus T Pre-anesthesia Checklist: Patient identified, Emergency Drugs available, Suction available and Patient being monitored Patient Re-evaluated:Patient Re-evaluated prior to induction Oxygen Delivery Method: Circle System Utilized Preoxygenation: Pre-oxygenation with 100% oxygen Induction Type: IV induction Ventilation: Mask ventilation without difficulty and Oral airway inserted - appropriate to patient size Laryngoscope Size: Sabra Heck and 2 Grade View: Grade II Tube type: Oral Tube size: 7.5 mm Number of attempts: 1 Airway Equipment and Method: Stylet and Oral airway Placement Confirmation: ETT inserted through vocal cords under direct vision,  positive ETCO2 and breath sounds checked- equal and bilateral Secured at: 22 cm Tube secured with: Tape Dental Injury: Teeth and Oropharynx as per pre-operative assessment

## 2017-06-01 NOTE — Anesthesia Postprocedure Evaluation (Signed)
Anesthesia Post Note  Patient: Carrie Potter  Procedure(s) Performed: Procedure(s) (LRB): Atrial Fibrillation Ablation (N/A)     Patient location during evaluation: PACU Anesthesia Type: General Level of consciousness: awake and alert Pain management: pain level controlled Vital Signs Assessment: post-procedure vital signs reviewed and stable Respiratory status: spontaneous breathing, nonlabored ventilation, respiratory function stable and patient connected to nasal cannula oxygen Cardiovascular status: blood pressure returned to baseline and stable Postop Assessment: no apparent nausea or vomiting Anesthetic complications: no    Last Vitals:  Vitals:   06/01/17 1425 06/01/17 1430  BP: (!) 176/55 (!) 179/60  Pulse: (!) 55 (!) 52  Resp: 18 14  Temp:    SpO2: 100% 100%    Last Pain:  Vitals:   06/01/17 1409  TempSrc: Tympanic                 Tiajuana Amass

## 2017-06-02 ENCOUNTER — Encounter (HOSPITAL_COMMUNITY): Payer: Self-pay | Admitting: Internal Medicine

## 2017-06-02 DIAGNOSIS — I481 Persistent atrial fibrillation: Secondary | ICD-10-CM

## 2017-06-02 DIAGNOSIS — E039 Hypothyroidism, unspecified: Secondary | ICD-10-CM | POA: Diagnosis not present

## 2017-06-02 DIAGNOSIS — I10 Essential (primary) hypertension: Secondary | ICD-10-CM | POA: Diagnosis not present

## 2017-06-02 DIAGNOSIS — I495 Sick sinus syndrome: Secondary | ICD-10-CM | POA: Diagnosis not present

## 2017-06-02 LAB — BASIC METABOLIC PANEL
Anion gap: 5 (ref 5–15)
BUN: 12 mg/dL (ref 6–20)
CALCIUM: 8.8 mg/dL — AB (ref 8.9–10.3)
CO2: 27 mmol/L (ref 22–32)
CREATININE: 0.97 mg/dL (ref 0.44–1.00)
Chloride: 105 mmol/L (ref 101–111)
GFR calc Af Amer: >60 mL/min (ref >60)
GFR calc non Af Amer: 60 mL/min — ABNORMAL LOW (ref >60)
GLUCOSE: 140 mg/dL — AB (ref 65–99)
Potassium: 4.7 mmol/L (ref 3.5–5.1)
Sodium: 137 mmol/L (ref 135–145)

## 2017-06-02 LAB — POCT ACTIVATED CLOTTING TIME
Activated Clotting Time: 301 seconds
Activated Clotting Time: 290 seconds
Activated Clotting Time: 335 seconds

## 2017-06-02 LAB — MAGNESIUM: Magnesium: 2.2 mg/dL (ref 1.7–2.4)

## 2017-06-02 NOTE — Progress Notes (Signed)
Pt discharged to home, follow up appointments and discharge instructions explained printed education for sight care given, verbalized understanding of instructions given, left facility via w/c accompanied by husband, condition stable.  Edward Qualia RN

## 2017-06-29 ENCOUNTER — Ambulatory Visit (HOSPITAL_COMMUNITY)
Admission: RE | Admit: 2017-06-29 | Discharge: 2017-06-29 | Disposition: A | Discharge status: Home / Self Care | Payer: BC Managed Care – PPO | Source: Ambulatory Visit | Attending: Nurse Practitioner | Admitting: Nurse Practitioner

## 2017-06-29 ENCOUNTER — Encounter (HOSPITAL_COMMUNITY): Payer: Self-pay | Admitting: Nurse Practitioner

## 2017-06-29 VITALS — BP 134/64 | HR 39 | Ht 64.0 in | Wt 219.0 lb

## 2017-06-29 DIAGNOSIS — I10 Essential (primary) hypertension: Secondary | ICD-10-CM | POA: Insufficient documentation

## 2017-06-29 DIAGNOSIS — E039 Hypothyroidism, unspecified: Secondary | ICD-10-CM | POA: Insufficient documentation

## 2017-06-29 DIAGNOSIS — Z87891 Personal history of nicotine dependence: Secondary | ICD-10-CM | POA: Diagnosis not present

## 2017-06-29 DIAGNOSIS — Z9889 Other specified postprocedural states: Secondary | ICD-10-CM | POA: Diagnosis not present

## 2017-06-29 DIAGNOSIS — J45909 Unspecified asthma, uncomplicated: Secondary | ICD-10-CM | POA: Insufficient documentation

## 2017-06-29 DIAGNOSIS — Z7901 Long term (current) use of anticoagulants: Secondary | ICD-10-CM | POA: Diagnosis not present

## 2017-06-29 DIAGNOSIS — I4891 Unspecified atrial fibrillation: Secondary | ICD-10-CM | POA: Diagnosis present

## 2017-06-29 DIAGNOSIS — Z79899 Other long term (current) drug therapy: Secondary | ICD-10-CM | POA: Insufficient documentation

## 2017-06-29 DIAGNOSIS — I4819 Other persistent atrial fibrillation: Secondary | ICD-10-CM

## 2017-06-29 DIAGNOSIS — Z880 Allergy status to penicillin: Secondary | ICD-10-CM | POA: Diagnosis not present

## 2017-06-29 DIAGNOSIS — E785 Hyperlipidemia, unspecified: Secondary | ICD-10-CM | POA: Insufficient documentation

## 2017-06-29 DIAGNOSIS — I481 Persistent atrial fibrillation: Secondary | ICD-10-CM

## 2017-06-29 NOTE — Progress Notes (Addendum)
Primary Care Physician: Aura Dials, MD Referring Physician: Dr. Albin Felling Carrie Potter is a 66 y.o. female with a h/o persistent afib that underwent her se ond ablation one month ago after her afib burden increased. Today, she denies any swallowing difficulties, no groin issues.Has not noted any afib since procedure. Continues on dofetilide 125 mcg bid. No missed doses of xarelto. Has chronic brady at 39 bpm, is not symptomatic with this.Overall she feels good since the procedure.  Today, she denies symptoms of palpitations, chest pain, shortness of breath, orthopnea, PND, lower extremity edema, dizziness, presyncope, syncope, or neurologic sequela. The patient is tolerating medications without difficulties and is otherwise without complaint today.   Past Medical History:  Diagnosis Date  . Asthma   . Atrial fibrillation ([place])   . Dysrhythmia    atrial fibrillation  . HTN (hypertension)   . Hyperlipidemia   . Hypothyroidism   . Obesity   . Palpitation   . Walking pneumonia ~ 10/2014   Past Surgical History:  Procedure Laterality Date  . ABDOMINAL HYSTERECTOMY    . ANKLE FRACTURE SURGERY Right ?1999  . ATRIAL FIBRILLATION ABLATION N/A 06/01/2017   Procedure: Atrial Fibrillation Ablation;  Surgeon: Thompson Grayer, MD;  Location: [place] [facility];  Service: Cardiovascular;  Laterality: N/A;  . CARDIOVERSION  03/08/2012   Procedure: CARDIOVERSION;  Surgeon: Candee Furbish, MD;  Location: [place];  Service: Cardiovascular;  Laterality: N/A;  . CARDIOVERSION N/A 05/02/2015   Procedure: CARDIOVERSION;  Surgeon: Jerline Pain, MD;  Location: [place];  Service: Cardiovascular;  Laterality: N/A;  . CARDIOVERSION N/A 05/16/2015   Procedure: CARDIOVERSION;  Surgeon: Sueanne Margarita, MD;  Location: [facility] END[facility]COPY;  Service: Cardiovascular;  Laterality: N/A;  . CARDIOVERSION N/A 08/14/2015   Procedure: CARDIOVERSION;  Surgeon: Josue Hector, MD;  Location: [facility] ENDOSCOPY;  Service:  Cardiovascular;  Laterality: N/A;  . CARDIOVERSION N/A 09/13/2015   Procedure: CARDIOVERSION;  Surgeon: Dorothy Spark, MD;  Location: [place];  Service: Cardiovascular;  Laterality: N/A;  . DILATION AND CURETTAGE OF UTERUS    . ELECTROPHYSIOLOGIC STUDY N/A 07/12/2015   Procedure: Atrial Fibrillation Ablation;  Surgeon: Thompson Grayer, MD;  Location: [place] [facility];  Service: Cardiovascular;  Laterality: N/A;  . FRACTURE SURGERY    . IR RADIOLOGY PERIPHERAL GUIDED IV START  05/26/2017  . IR US GUIDE VASC ACCESS RIGHT  05/26/2017  . TEE WITHOUT CARDIOVERSION N/A 07/11/2015   Procedure: TRANSESOPHAGEAL ECHOCARDIOGRAM (TEE);  Surgeon: Skeet Latch, MD;  Location: [facility] ENDOSCOPY;  Service: Cardiovascular;  Laterality: N/A;  . TONSILLECTOMY    . TUBAL LIGATION      Current Outpatient Prescriptions  Medication Sig Dispense Refill  . acetaminophen (TYLENOL) 500 MG tablet Take 500-1,000 mg by mouth every 6 (six) hours as needed for mild pain or moderate pain.     Marland Kitchen albuterol (PROVENTIL HFA;VENTOLIN HFA) 108 (90 BASE) MCG/ACT inhaler Inhale 2 puffs into the lungs every 4 (four) hours as needed for shortness of breath.     . dofetilide (TIKOSYN) 125 MCG capsule TAKE 3 CAPSULES (375 MCG TOTAL) BY MOUTH 2 (TWO) TIMES DAILY. 180 capsule 3  . levothyroxine (SYNTHROID, LEVOTHROID) 125 MCG tablet Take 125 mcg by mouth daily.    . magnesium oxide (MAG-OX) 400 MG tablet TAKE 1 TABLET BY MOUTH EVERY DAY 30 tablet 9  . Multiple Vitamin (MULTIVITAMIN) tablet Take 1 tablet by mouth daily. Women's Gummies     . Probiotic Product (PROBIOTIC PO) Take  1 tablet by mouth daily.    . rivaroxaban (XARELTO) 20 MG TABS tablet Take 1 tablet (20 mg total) by mouth daily with supper. 30 tablet 11  . spironolactone (ALDACTONE) 25 MG tablet Take 1 tablet (25 mg total) by mouth 2 (two) times daily. 180 tablet 3  . tolterodine (DETROL) 2 MG tablet Take 2 mg by mouth daily.    . triazolam (HALCION) 0.25 MG tablet Take  0.25 mg by mouth as directed. Take before Dental appts.  0  . valACYclovir (VALTREX) 1000 MG tablet Take 1,000 mg by mouth 2 (two) times daily as needed. Cold sore     No current facility-administered medications for this encounter.     Allergies  Allergen Reactions  . Lisinopril Swelling  . Penicillins Hives    Has patient had a PCN reaction causing immediate rash, facial/tongue/throat swelling, SOB or lightheadedness with hypotension: Yes Has patient had a PCN reaction causing severe rash involving mucus membranes or skin necrosis: No Has patient had a PCN reaction that required hospitalization No Has patient had a PCN reaction occurring within the last 10 years: No If all of the above answers are "NO", then may proceed with Cephalosporin use.  . Diltiazem Rash  . Norvasc [Amlodipine] Rash    Social History   Social History  . Marital status: Married    Spouse name: N/A  . Number of children: N/A  . Years of education: N/A   Occupational History  . Not on file.   Social History Main Topics  . Smoking status: Former Smoker    Packs/day: 1.00    Years: 3.00    Types: Cigarettes  . Smokeless tobacco: Never Used     Comment: "quit smoking ~ 1976"  . Alcohol use 2.4 oz/week    4 Glasses of wine per week  . Drug use: No  . Sexual activity: Yes   Other Topics Concern  . Not on file   Social History Narrative  . No narrative on file    Family History  Problem Relation Age of Onset  . COPD Father   . Heart disease Father   . Cancer Father        skin  . Breast cancer Mother   . Cancer Mother        skin  . Hypertension Mother   . Heart attack Neg Hx   . Stroke Neg Hx     ROS- All systems are reviewed and negative except as per the HPI above  Physical Exam: Vitals:   06/29/17 1123  BP: 134/64  Pulse: (!) 39  Weight: 219 lb (99.3 kg)  Height: 5\' 4"  (1.626 m)   Wt Readings from Last 3 Encounters:  06/29/17 219 lb (99.3 kg)  06/02/17 224 lb 6.4 oz  (101.8 kg)  05/10/17 226 lb 12.8 oz (102.9 kg)    Labs: Lab Results  Component Value Date   NA 137 06/02/2017   K 4.7 06/02/2017   CL 105 06/02/2017   CO2 27 06/02/2017   GLUCOSE 140 (H) 06/02/2017   BUN 12 06/02/2017   CREATININE 0.97 06/02/2017   CALCIUM 8.8 (L) 06/02/2017   MG 2.2 06/02/2017   No results found for: INR No results found for: CHOL, HDL, LDLCALC, TRIG   GEN- The patient is well appearing, alert and oriented x 3 today.   Head- normocephalic, atraumatic Eyes-  Sclera clear, conjunctiva pink Ears- hearing intact Oropharynx- clear Neck- supple, no JVP Lymph- no cervical lymphadenopathy Lungs-  Clear to ausculation bilaterally, normal work of breathing Heart- Slow regular rate and rhythm, no murmurs, rubs or gallops, PMI not laterally displaced GI- soft, NT, ND, + BS Extremities- no clubbing, cyanosis, or edema MS- no significant deformity or atrophy Skin- no rash or lesion Psych- euthymic mood, full affect Neuro- strength and sensation are intact  EKG- Marked sinus brady at 39 bpm, pr int 200 ms, qrs int 76 ms, qtc 415 ms Epic records reviewed    Assessment and Plan: 1. afib Second ablation performed one month ago with no appreciable afib Continue xarelto without missed doses for chadsvasc score of at lease 3 Continue dofetilide 125 mcg bid No AV nodal blocking agents due to bradycardia, from which is chronic and pt is asymptomatic Pt has lost 7 lbs and is going to restart walking routine   2. HTN Stable Continue spironolactone  F/u with Dr. Rayann Heman as scheduled 12/19 afib clinic as needed  Butch Penny C. Sayla Golonka, [facility] [address] [place], [place] [address] [phone]

## 2017-07-28 ENCOUNTER — Telehealth: Payer: Self-pay

## 2017-07-28 NOTE — Telephone Encounter (Signed)
**Note De-Identified Mick Tanguma Obfuscation** We received a PA request form for Carrie Potter fax. I completed the PA form and Dr Rayann Heman has signed it. I have faxed it back to [employer] at [phone].

## 2017-07-29 NOTE — Telephone Encounter (Signed)
Follow up    [facility] calling to confirm if Tikosyn prescription is generic or brand name. If Brand name , documentation is needed.  [facility] [phone] EXT [id number] Ask for Debbie.

## 2017-07-29 NOTE — Telephone Encounter (Signed)
Spoke with patient regarding the fact that she is taking the generic form of Tikosyn (dofetilide).  Patient verified medication form.

## 2017-07-29 NOTE — Telephone Encounter (Signed)
Spoke with pharmacy informing them that the patient uses generic form of Tikosyn.

## 2017-08-01 IMAGING — CR DG HAND COMPLETE 3+V*L*
3 series · 3 of 3 positions shown · non-contrast
Comparison: None.

CLINICAL DATA: Patient fell today and has pain in left hand and
anterior upper right chest and lateral right chest with BB markers
showing both areas of pain.

EXAM:
LEFT HAND - COMPLETE 3+ VIEW

[hand ap]
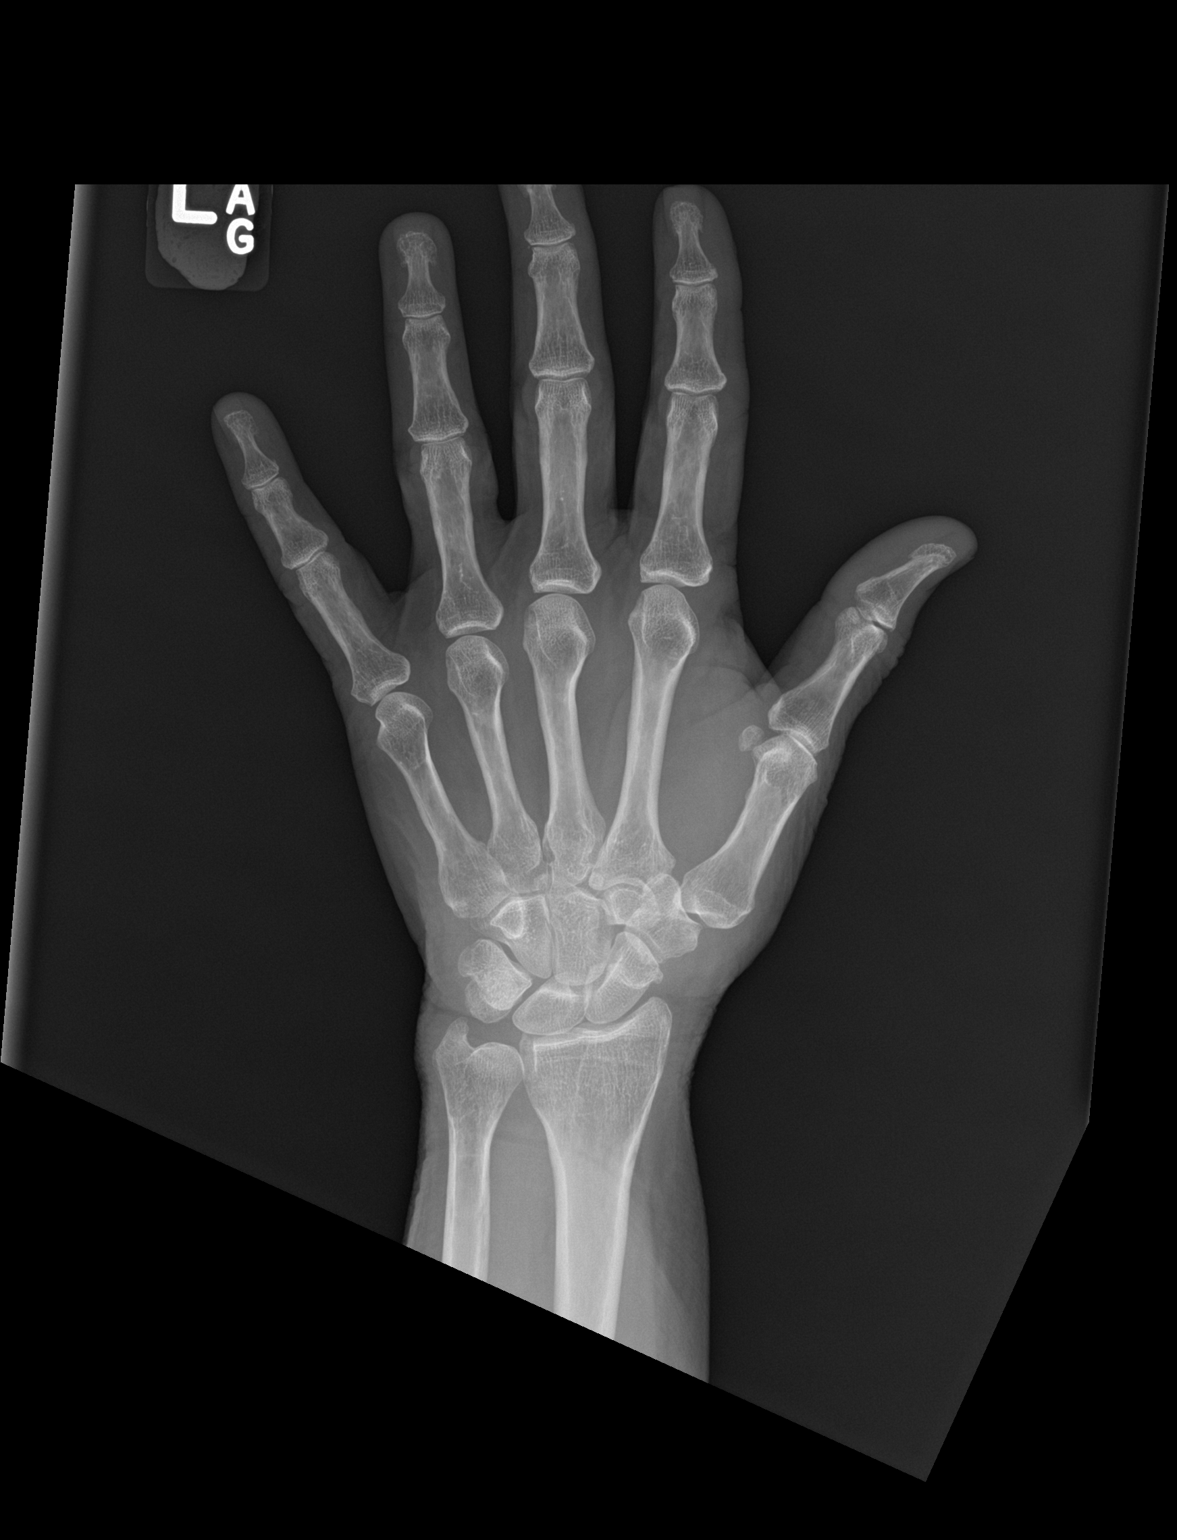

[hand obl]
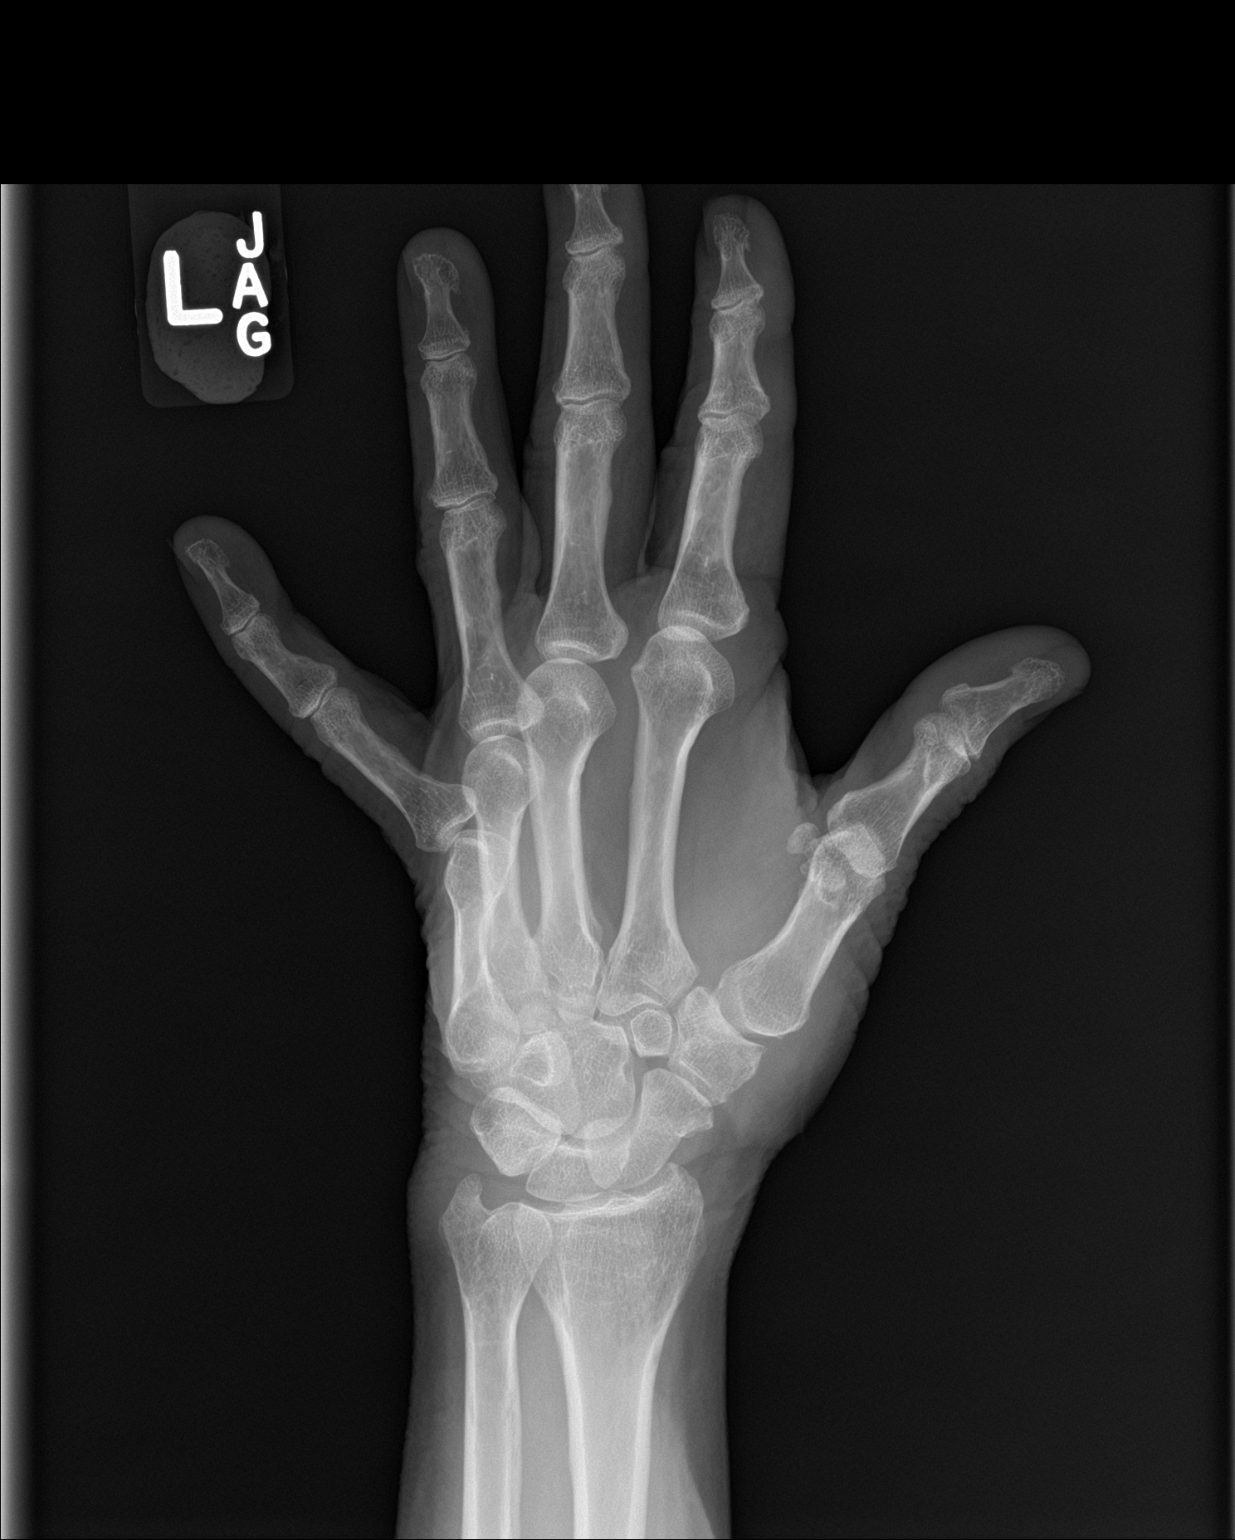

[hand lat]
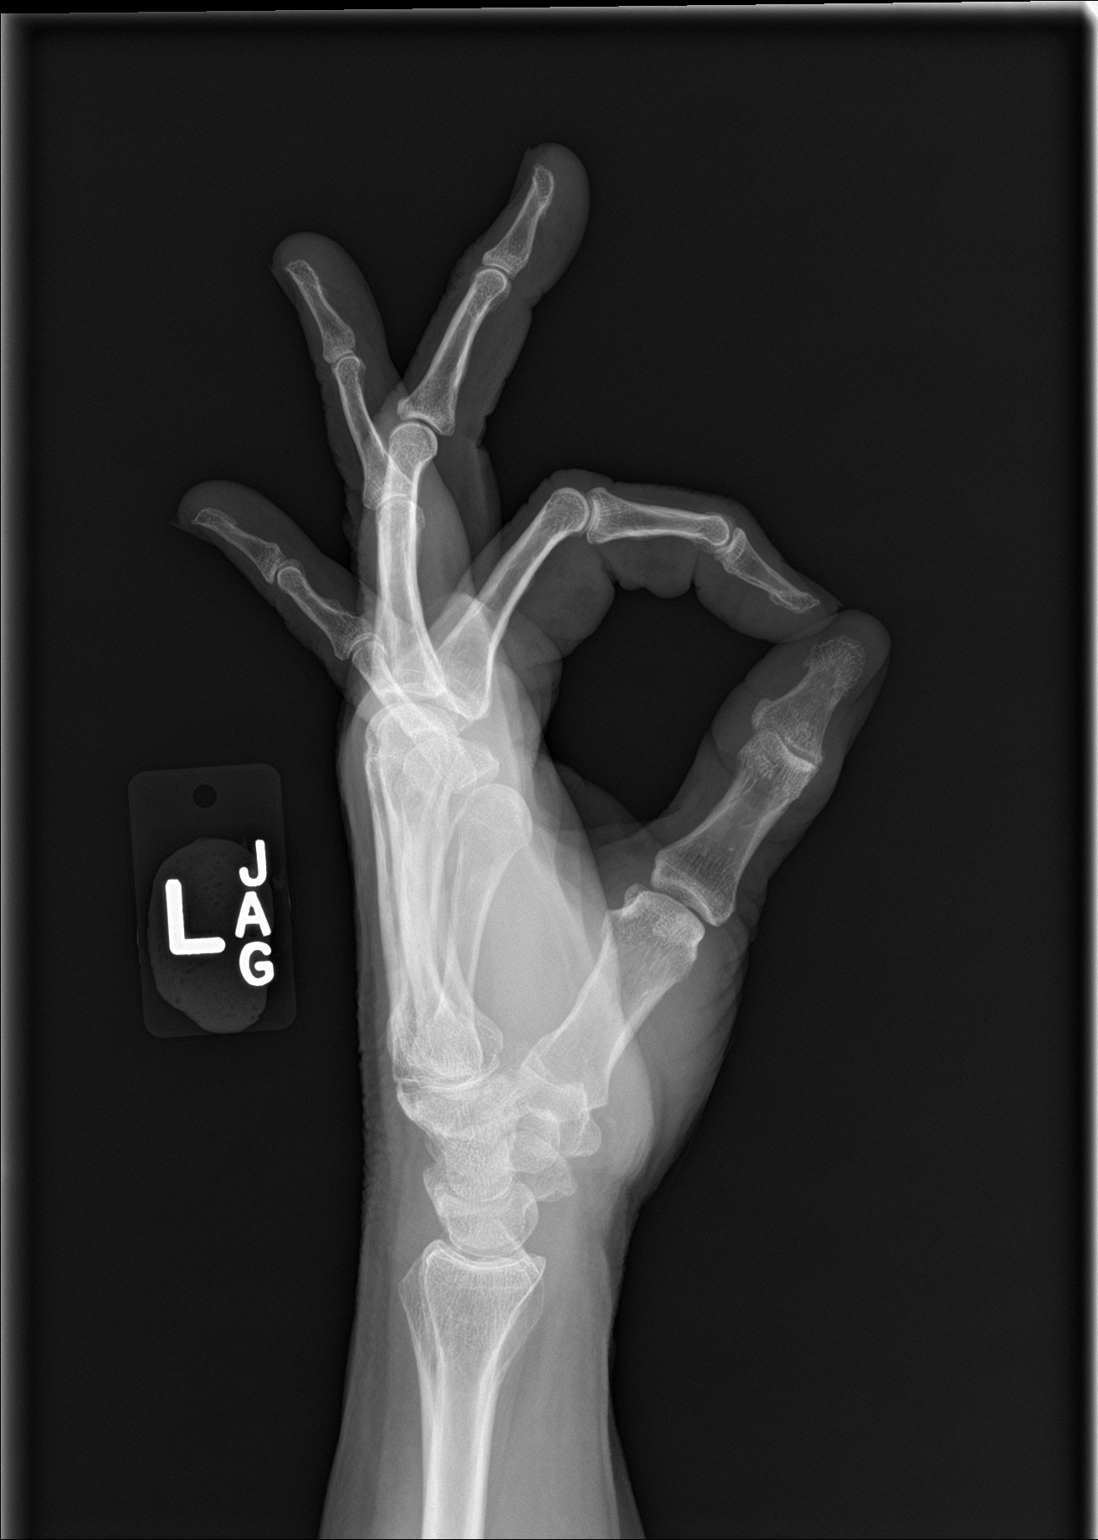

[3 of 3 positions shown; findings below may reference images not displayed]

FINDINGS: No fracture or dislocation.  No bone lesion.

No significant arthropathic changes.

Soft tissues are unremarkable.
IMPRESSION: No fracture, dislocation or acute abnormality.

## 2017-08-10 NOTE — Telephone Encounter (Signed)
**Note De-Identified Saleena Tamas Obfuscation** Approval on Dofetilide PA received from [facility] Aldona Bryner fax. Approval good from 07/29/2017 until 07/30/2019.

## 2017-08-18 ENCOUNTER — Other Ambulatory Visit: Payer: Self-pay | Admitting: Nurse Practitioner

## 2017-08-19 ENCOUNTER — Other Ambulatory Visit: Payer: Self-pay | Admitting: Cardiology

## 2017-08-19 NOTE — Telephone Encounter (Signed)
Pt last saw Dr Rayann Heman on 05/10/17, last labs 06/02/17 Creat 0.97, age 66, weight 99.3kg, CrCl 90.64, based on CrCl pt is on appropriate dosage of Xarelto 20mg  QD, will refill rx.

## 2017-09-01 ENCOUNTER — Encounter: Payer: Self-pay | Admitting: Internal Medicine

## 2017-09-01 ENCOUNTER — Ambulatory Visit: Payer: BC Managed Care – PPO | Admitting: Internal Medicine

## 2017-09-01 VITALS — BP 176/80 | HR 46 | Ht 64.0 in | Wt 226.6 lb

## 2017-09-01 DIAGNOSIS — I481 Persistent atrial fibrillation: Secondary | ICD-10-CM

## 2017-09-01 DIAGNOSIS — R001 Bradycardia, unspecified: Secondary | ICD-10-CM

## 2017-09-01 DIAGNOSIS — I10 Essential (primary) hypertension: Secondary | ICD-10-CM

## 2017-09-01 DIAGNOSIS — I4819 Other persistent atrial fibrillation: Secondary | ICD-10-CM

## 2017-09-01 NOTE — Progress Notes (Signed)
PCP: Aura Dials, MD    Carrie Potter is a 66 y.o. female who presents today for routine electrophysiology followup.  Since his recent afib ablation, the patient reports doing very well.  she denies procedure related complications and is pleased with the results of the procedure.  Today, she denies symptoms of palpitations, chest pain, shortness of breath,  lower extremity edema, dizziness, presyncope, or syncope.  The patient is otherwise without complaint today.   Past Medical History:  Diagnosis Date  . Asthma   . Atrial fibrillation ([place])   . Dysrhythmia    atrial fibrillation  . HTN (hypertension)   . Hyperlipidemia   . Hypothyroidism   . Obesity   . Palpitation   . Walking pneumonia ~ 10/2014   Past Surgical History:  Procedure Laterality Date  . ABDOMINAL HYSTERECTOMY    . ANKLE FRACTURE SURGERY Right ?1999  . ATRIAL FIBRILLATION ABLATION N/A 06/01/2017   Procedure: Atrial Fibrillation Ablation;  Surgeon: Thompson Grayer, MD;  Location: [place] [facility];  Service: Cardiovascular;  Laterality: N/A;  . CARDIOVERSION  03/08/2012   Procedure: CARDIOVERSION;  Surgeon: Candee Furbish, MD;  Location: [place];  Service: Cardiovascular;  Laterality: N/A;  . CARDIOVERSION N/A 05/02/2015   Procedure: CARDIOVERSION;  Surgeon: Jerline Pain, MD;  Location: [place];  Service: Cardiovascular;  Laterality: N/A;  . CARDIOVERSION N/A 05/16/2015   Procedure: CARDIOVERSION;  Surgeon: Sueanne Margarita, MD;  Location: [facility] ENDOSCOPY;  Service: Cardiovascular;  Laterality: N/A;  . CARDIOVERSION N/A 08/14/2015   Procedure: CARDIOVERSION;  Surgeon: Josue Hector, MD;  Location: [facility] ENDOSCOPY;  Service: Cardiovascular;  Laterality: N/A;  . CARDIOVERSION N/A 09/13/2015   Procedure: CARDIOVERSION;  Surgeon: Dorothy Spark, MD;  Location: [place];  Service: Cardiovascular;  Laterality: N/A;  . DILATION AND CURETTAGE OF UTERUS    . ELECTROPHYSIOLOGIC STUDY N/A 07/12/2015   Procedure: Atrial  Fibrillation Ablation;  Surgeon: Thompson Grayer, MD;  Location: [place] [facility];  Service: Cardiovascular;  Laterality: N/A;  . FRACTURE SURGERY    . IR RADIOLOGY PERIPHERAL GUIDED IV START  05/26/2017  . IR US GUIDE VASC ACCESS RIGHT  05/26/2017  . TEE WITHOUT CARDIOVERSION N/A 07/11/2015   Procedure: TRANSESOPHAGEAL ECHOCARDIOGRAM (TEE);  Surgeon: Skeet Latch, MD;  Location: [facility] ENDOSCOPY;  Service: Cardiovascular;  Laterality: N/A;  . TONSILLECTOMY    . TUBAL LIGATION      ROS- all systems are personally reviewed and negatives except as per HPI above  Current Outpatient Medications  Medication Sig Dispense Refill  . acetaminophen (TYLENOL) 500 MG tablet Take 500-1,000 mg by mouth every 6 (six) hours as needed for mild pain or moderate pain.     Marland Kitchen albuterol (PROVENTIL HFA;VENTOLIN HFA) 108 (90 BASE) MCG/ACT inhaler Inhale 2 puffs into the lungs every 4 (four) hours as needed for shortness of breath.     . dofetilide (TIKOSYN) 125 MCG capsule TAKE 3 CAPSULES (375 MCG TOTAL) BY MOUTH 2 (TWO) TIMES DAILY. 180 capsule 3  . levothyroxine (SYNTHROID, LEVOTHROID) 125 MCG tablet Take 125 mcg by mouth daily.    . magnesium oxide (MAG-OX) 400 MG tablet TAKE 1 TABLET BY MOUTH EVERY DAY 30 tablet 9  . Multiple Vitamin (MULTIVITAMIN) tablet Take 1 tablet by mouth daily. Women's Gummies     . Probiotic Product (PROBIOTIC PO) Take 1 tablet by mouth daily.    Marland Kitchen spironolactone (ALDACTONE) 25 MG tablet Take 1 tablet (25 mg total) by mouth 2 (two) times daily. [place]  tablet 3  . tolterodine (DETROL) 2 MG tablet Take 2 mg by mouth daily.    . triazolam (HALCION) 0.25 MG tablet Take 0.25 mg by mouth as directed. Take before Dental appts.  0  . valACYclovir (VALTREX) 1000 MG tablet Take 1,000 mg by mouth 2 (two) times daily as needed. Cold sore    . XARELTO 20 MG TABS tablet TAKE 1 TABLET (20 MG TOTAL) BY MOUTH DAILY WITH SUPPER. 30 tablet 6   No current facility-administered medications for this visit.      Physical Exam: Vitals:   09/01/17 1155  BP: (!) 176/80  Pulse: (!) 46  SpO2: 97%  Weight: 226 lb 9.6 oz (102.8 kg)  Height: 5\' 4"  (1.626 m)    GEN- The patient is well appearing, alert and oriented x 3 today.   Head- normocephalic, atraumatic Eyes-  Sclera clear, conjunctiva pink Ears- hearing intact Oropharynx- clear Lungs- Clear to ausculation bilaterally, normal work of breathing Heart- Regular rate and rhythm, no murmurs, rubs or gallops, PMI not laterally displaced GI- soft, NT, ND, + BS Extremities- no clubbing, cyanosis, or edema  EKG tracing ordered today is personally reviewed and shows sinus bradycardia 46 bpm, PR 192 msec, QRS 80 msec, Qtc 451 msec  Assessment and Plan:  1. Persistent atrial fibrillation Doing well s/p ablation chads2vasc score is 3.  Continue anticoagulation Consider stopping tikosyn on return  2. HTN Elevated today She is not yet ready to make medicine change I have advised that she check her BP closely at home and send me a MyChart message with results.  We will likely No change required today  3. Sinus bradycardia Asymptomatic currently No change required today  4. Obesity Body mass index is 38.9 kg/m. Lifestyle modification discussed today  Return to see me in 3 months  Thompson Grayer MD, [facility] 09/01/2017 12:22 PM

## 2017-09-01 NOTE — Patient Instructions (Signed)
Medication Instructions:  Your physician recommends that you continue on your current medications as directed. Please refer to the Current Medication list given to you today.   Labwork: None ordered   Testing/Procedures: None ordered   Follow-Up: Your physician recommends that you schedule a follow-up appointment in: 3 months with Dr Rayann Heman   Any Other Special Instructions Will Be Listed Below (If Applicable).  Please send some BP readings to Dr Rayann Heman through [facility]     If you need a refill on your cardiac medications before your next appointment, please call your pharmacy.

## 2017-10-22 ENCOUNTER — Encounter: Payer: Self-pay | Admitting: Internal Medicine

## 2017-11-05 ENCOUNTER — Ambulatory Visit (INDEPENDENT_AMBULATORY_CARE_PROVIDER_SITE_OTHER): Payer: BC Managed Care – PPO

## 2017-11-05 ENCOUNTER — Ambulatory Visit: Payer: BC Managed Care – PPO

## 2017-11-05 ENCOUNTER — Other Ambulatory Visit: Payer: Self-pay | Admitting: Podiatry

## 2017-11-05 ENCOUNTER — Ambulatory Visit: Payer: BC Managed Care – PPO | Admitting: Podiatry

## 2017-11-05 ENCOUNTER — Encounter: Payer: Self-pay | Admitting: Podiatry

## 2017-11-05 DIAGNOSIS — M779 Enthesopathy, unspecified: Secondary | ICD-10-CM

## 2017-11-05 DIAGNOSIS — R52 Pain, unspecified: Secondary | ICD-10-CM

## 2017-11-05 DIAGNOSIS — M7751 Other enthesopathy of right foot: Secondary | ICD-10-CM | POA: Diagnosis not present

## 2017-11-05 MED ORDER — TRAMADOL HCL 50 MG PO TABS: 50.0000 mg | ORAL_TABLET | Freq: Four times a day (QID) | ORAL | 0 refills | Status: AC | PRN

## 2017-11-08 NOTE — Progress Notes (Signed)
   HPI: 67 year old female presenting today with a chief complaint of pain to the right great toe that began one week ago. Applying pressure to the area increases the pain. She has not done anything to treat the symptoms. Patient is here for further evaluation and treatment.   Past Medical History:  Diagnosis Date  . Asthma   . Atrial fibrillation ([place])   . Dysrhythmia    atrial fibrillation  . HTN (hypertension)   . Hyperlipidemia   . Hypothyroidism   . Obesity   . Palpitation   . Walking pneumonia ~ 10/2014     Physical Exam: General: The patient is alert and oriented x3 in no acute distress.  Dermatology: Skin is warm, dry and supple bilateral lower extremities. Negative for open lesions or macerations.  Vascular: Palpable pedal pulses bilaterally. No edema or erythema noted. Capillary refill within normal limits.  Neurological: Epicritic and protective threshold grossly intact bilaterally.   Musculoskeletal Exam: Pain with palpation of the right great toe. Range of motion within normal limits to all pedal and ankle joints bilateral. Muscle strength 5/5 in all groups bilateral.   Radiographic Exam:  Normal osseous mineralization. Joint spaces preserved. No fracture/dislocation/boney destruction.    Assessment: - Right great toe capsulitis   Plan of Care:  - Patient evaluated. X-Rays reviewed.  - Offloading pads dispensed.  - Post op shoe dispensed.  - Prescription for tramadol provided to patient.  - Patient on Xarelto and cannot take oral NSAIDs.  - Return to clinic as needed.    Edrick Kins, DPM [facility]  Dr. Edrick Kins, DPM    [address], [place] [address]                Office [phone]  Fax [phone]

## 2017-12-01 ENCOUNTER — Ambulatory Visit: Payer: Self-pay | Admitting: Internal Medicine

## 2017-12-12 ENCOUNTER — Other Ambulatory Visit: Payer: Self-pay | Admitting: Nurse Practitioner

## 2017-12-22 ENCOUNTER — Encounter: Payer: Self-pay | Admitting: Internal Medicine

## 2017-12-22 ENCOUNTER — Encounter: Payer: Self-pay | Admitting: *Deleted

## 2017-12-22 ENCOUNTER — Ambulatory Visit: Payer: BC Managed Care – PPO | Admitting: Internal Medicine

## 2017-12-22 VITALS — BP 122/64 | HR 51 | Ht 64.0 in | Wt 225.0 lb

## 2017-12-22 DIAGNOSIS — I481 Persistent atrial fibrillation: Secondary | ICD-10-CM | POA: Diagnosis not present

## 2017-12-22 DIAGNOSIS — R001 Bradycardia, unspecified: Secondary | ICD-10-CM

## 2017-12-22 DIAGNOSIS — I4819 Other persistent atrial fibrillation: Secondary | ICD-10-CM

## 2017-12-22 DIAGNOSIS — I10 Essential (primary) hypertension: Secondary | ICD-10-CM | POA: Diagnosis not present

## 2017-12-22 NOTE — Patient Instructions (Addendum)
Medication Instructions:  Your physician recommends that you continue on your current medications as directed. Please refer to the Current Medication list given to you today.  Labwork: None ordered.  Testing/Procedures: None ordered.  Follow-Up: Your physician wants you to follow-up in: 4 months with Dr. Rayann Heman on 05/02/2018 @ 2:00 pm.   Any Other Special Instructions Will Be Listed Below (If Applicable).  If you need a refill on your cardiac medications before your next appointment, please call your pharmacy.

## 2017-12-22 NOTE — Progress Notes (Signed)
PCP: Aura Dials, MD   Primary EP: Dr Etheleen Sia is a 67 y.o. female who presents today for routine electrophysiology followup.  Since last being seen in our clinic, the patient reports doing very well.  Today, she denies symptoms of palpitations, chest pain, shortness of breath,  lower extremity edema, dizziness, presyncope, or syncope.  The patient is otherwise without complaint today.   Past Medical History:  Diagnosis Date  . Asthma   . Atrial fibrillation ([place])   . Dysrhythmia    atrial fibrillation  . HTN (hypertension)   . Hyperlipidemia   . Hypothyroidism   . Obesity   . Palpitation   . Walking pneumonia ~ 10/2014   Past Surgical History:  Procedure Laterality Date  . ABDOMINAL HYSTERECTOMY    . ANKLE FRACTURE SURGERY Right ?1999  . ATRIAL FIBRILLATION ABLATION N/A 06/01/2017   Procedure: Atrial Fibrillation Ablation;  Surgeon: Thompson Grayer, MD;  Location: [place] [facility];  Service: Cardiovascular;  Laterality: N/A;  . CARDIOVERSION  03/08/2012   Procedure: CARDIOVERSION;  Surgeon: Candee Furbish, MD;  Location: [place];  Service: Cardiovascular;  Laterality: N/A;  . CARDIOVERSION N/A 05/02/2015   Procedure: CARDIOVERSION;  Surgeon: Jerline Pain, MD;  Location: [place];  Service: Cardiovascular;  Laterality: N/A;  . CARDIOVERSION N/A 05/16/2015   Procedure: CARDIOVERSION;  Surgeon: Sueanne Margarita, MD;  Location: [facility] ENDOSCOPY;  Service: Cardiovascular;  Laterality: N/A;  . CARDIOVERSION N/A 08/14/2015   Procedure: CARDIOVERSION;  Surgeon: Josue Hector, MD;  Location: [facility] ENDOSCOPY;  Service: Cardiovascular;  Laterality: N/A;  . CARDIOVERSION N/A 09/13/2015   Procedure: CARDIOVERSION;  Surgeon: Dorothy Spark, MD;  Location: [place];  Service: Cardiovascular;  Laterality: N/A;  . DILATION AND CURETTAGE OF UTERUS    . ELECTROPHYSIOLOGIC STUDY N/A 07/12/2015   Procedure: Atrial Fibrillation Ablation;  Surgeon: Thompson Grayer, MD;  Location: [place] [facility] LAB;  Service: Cardiovascular;  Laterality: N/A;  . FRACTURE SURGERY    . IR RADIOLOGY PERIPHERAL GUIDED IV START  05/26/2017  . IR US GUIDE VASC ACCESS RIGHT  05/26/2017  . TEE WITHOUT CARDIOVERSION N/A 07/11/2015   Procedure: TRANSESOPHAGEAL ECHOCARDIOGRAM (TEE);  Surgeon: Skeet Latch, MD;  Location: [facility] ENDOSCOPY;  Service: Cardiovascular;  Laterality: N/A;  . TONSILLECTOMY    . TUBAL LIGATION      ROS- all systems are reviewed and negatives except as per HPI above  Current Outpatient Medications  Medication Sig Dispense Refill  . acetaminophen (TYLENOL) 500 MG tablet Take 500-1,000 mg by mouth every 6 (six) hours as needed for mild pain or moderate pain.     Marland Kitchen albuterol (PROVENTIL HFA;VENTOLIN HFA) 108 (90 BASE) MCG/ACT inhaler Inhale 2 puffs into the lungs every 4 (four) hours as needed for shortness of breath.     . clindamycin (CLEOCIN) 300 MG capsule Take 300 mg by mouth every 6 (six) hours. For 10 days    . dofetilide (TIKOSYN) 125 MCG capsule TAKE 3 CAPSULES (375 MCG TOTAL) BY MOUTH 2 (TWO) TIMES DAILY. 180 capsule 3  . levothyroxine (SYNTHROID, LEVOTHROID) 125 MCG tablet Take 125 mcg by mouth daily.    . magnesium oxide (MAG-OX) 400 MG tablet TAKE 1 TABLET BY MOUTH EVERY DAY 30 tablet 9  . Multiple Vitamin (MULTIVITAMIN) tablet Take 1 tablet by mouth daily. Women's Gummies     . Probiotic Product (PROBIOTIC PO) Take 1 tablet by mouth daily.    Marland Kitchen spironolactone (ALDACTONE) 25 MG tablet Take 1  tablet (25 mg total) by mouth 2 (two) times daily. 180 tablet 3  . tolterodine (DETROL) 2 MG tablet Take 2 mg by mouth daily.    . traMADol (ULTRAM) 50 MG tablet Take 1 tablet (50 mg total) by mouth every 6 (six) hours as needed. 30 tablet 0  . triazolam (HALCION) 0.25 MG tablet Take 0.25 mg by mouth as directed. Take before Dental appts.  0  . valACYclovir (VALTREX) 1000 MG tablet Take 1,000 mg by mouth 2 (two) times daily as needed. Cold sore    . XARELTO 20 MG TABS tablet  TAKE 1 TABLET (20 MG TOTAL) BY MOUTH DAILY WITH SUPPER. 30 tablet 6   No current facility-administered medications for this visit.     Physical Exam: Vitals:   12/22/17 1508  BP: 122/64  Pulse: (!) 51  Weight: 225 lb (102.1 kg)  Height: 5\' 4"  (1.626 m)    GEN- The patient is well appearing, alert and oriented x 3 today.   Head- normocephalic, atraumatic Eyes-  Sclera clear, conjunctiva pink Ears- hearing intact Oropharynx- clear Lungs- Clear to ausculation bilaterally, normal work of breathing Heart- Regular rate and rhythm, no murmurs, rubs or gallops, PMI not laterally displaced GI- soft, NT, ND, + BS Extremities- no clubbing, cyanosis, or edema  EKG tracing ordered today is personally reviewed and shows sinus rhythm with marked sinus arrhythmia  Assessment and Plan:  1. Persistent afib Doing well s/p ablation chads2vasc score is 3.  On anticoagulation. She wishes to stop anticoagulation.  She discussed long term monitoring as an option to reduce anticoagulation burden.  She will think about this but is not ready to proceed at this time. She is retiring in June and wishes to continue [place] until return  2. HTN Stable No change required today  3. Sinus bradycardia Asymptomatic  4. Obesity Body mass index is 38.62 kg/m. Lifestyle modification encouraged  Return to see me in 4 months.  She is moving to near [place] but wishes to continue follow-up with me.  Thompson Grayer MD, [facility] 12/22/2017 3:33 PM

## 2017-12-30 ENCOUNTER — Other Ambulatory Visit: Payer: Self-pay | Admitting: Internal Medicine

## 2018-01-06 ENCOUNTER — Other Ambulatory Visit: Payer: Self-pay | Admitting: Family Medicine

## 2018-01-06 DIAGNOSIS — Z1231 Encounter for screening mammogram for malignant neoplasm of breast: Secondary | ICD-10-CM

## 2018-01-27 ENCOUNTER — Ambulatory Visit: Payer: Self-pay

## 2018-01-31 ENCOUNTER — Ambulatory Visit
Admission: RE | Admit: 2018-01-31 | Discharge: 2018-01-31 | Disposition: A | Discharge status: Home / Self Care | Payer: BC Managed Care – PPO | Source: Ambulatory Visit | Attending: Family Medicine | Admitting: Family Medicine

## 2018-01-31 DIAGNOSIS — Z1231 Encounter for screening mammogram for malignant neoplasm of breast: Secondary | ICD-10-CM

## 2018-02-17 ENCOUNTER — Other Ambulatory Visit: Payer: Self-pay | Admitting: Internal Medicine

## 2018-02-17 NOTE — Telephone Encounter (Signed)
Pt last saw Dr Rayann Heman 12/22/17, last labs 06/02/17 Creat 0.97, age 67, weight 102.1kg, CrCl 93.2, based on CrCl pt is on appropriate dosage of Xarelto 20mg  QD.  Will refill rx.

## 2018-02-24 ENCOUNTER — Other Ambulatory Visit: Payer: Self-pay

## 2018-02-24 MED ORDER — SPIRONOLACTONE 25 MG PO TABS: 25.0000 mg | ORAL_TABLET | Freq: Two times a day (BID) | ORAL | 3 refills | Status: DC

## 2018-03-09 ENCOUNTER — Encounter (HOSPITAL_COMMUNITY): Payer: Self-pay | Admitting: Nurse Practitioner

## 2018-03-09 ENCOUNTER — Ambulatory Visit (HOSPITAL_COMMUNITY)
Admission: RE | Admit: 2018-03-09 | Discharge: 2018-03-09 | Disposition: A | Discharge status: Home / Self Care | Payer: BC Managed Care – PPO | Source: Ambulatory Visit | Attending: Nurse Practitioner | Admitting: Nurse Practitioner

## 2018-03-09 VITALS — BP 160/84 | HR 44 | Ht 64.0 in | Wt 210.0 lb

## 2018-03-09 DIAGNOSIS — Z7902 Long term (current) use of antithrombotics/antiplatelets: Secondary | ICD-10-CM | POA: Insufficient documentation

## 2018-03-09 DIAGNOSIS — R42 Dizziness and giddiness: Secondary | ICD-10-CM | POA: Insufficient documentation

## 2018-03-09 DIAGNOSIS — E669 Obesity, unspecified: Secondary | ICD-10-CM | POA: Diagnosis not present

## 2018-03-09 DIAGNOSIS — Z79890 Hormone replacement therapy (postmenopausal): Secondary | ICD-10-CM | POA: Diagnosis not present

## 2018-03-09 DIAGNOSIS — J45909 Unspecified asthma, uncomplicated: Secondary | ICD-10-CM | POA: Diagnosis not present

## 2018-03-09 DIAGNOSIS — Z87891 Personal history of nicotine dependence: Secondary | ICD-10-CM | POA: Diagnosis not present

## 2018-03-09 DIAGNOSIS — Z79899 Other long term (current) drug therapy: Secondary | ICD-10-CM | POA: Insufficient documentation

## 2018-03-09 DIAGNOSIS — I4891 Unspecified atrial fibrillation: Secondary | ICD-10-CM | POA: Insufficient documentation

## 2018-03-09 DIAGNOSIS — I10 Essential (primary) hypertension: Secondary | ICD-10-CM | POA: Insufficient documentation

## 2018-03-09 DIAGNOSIS — I4819 Other persistent atrial fibrillation: Secondary | ICD-10-CM

## 2018-03-09 DIAGNOSIS — E785 Hyperlipidemia, unspecified: Secondary | ICD-10-CM | POA: Insufficient documentation

## 2018-03-09 DIAGNOSIS — I481 Persistent atrial fibrillation: Secondary | ICD-10-CM | POA: Diagnosis not present

## 2018-03-09 DIAGNOSIS — E039 Hypothyroidism, unspecified: Secondary | ICD-10-CM | POA: Insufficient documentation

## 2018-03-09 DIAGNOSIS — R001 Bradycardia, unspecified: Secondary | ICD-10-CM | POA: Insufficient documentation

## 2018-03-09 LAB — BASIC METABOLIC PANEL
ANION GAP: 7 (ref 5–15)
BUN: 20 mg/dL (ref 8–23)
CALCIUM: 9.5 mg/dL (ref 8.9–10.3)
CHLORIDE: 103 mmol/L (ref 98–111)
CO2: 26 mmol/L (ref 22–32)
CREATININE: 1.28 mg/dL — AB (ref 0.44–1.00)
GFR calc non Af Amer: 43 mL/min — ABNORMAL LOW (ref >60)
GFR, EST AFRICAN AMERICAN: 50 mL/min — AB (ref >60)
GLUCOSE: 138 mg/dL — AB (ref 70–99)
Potassium: 5.2 mmol/L — ABNORMAL HIGH (ref 3.5–5.1)
Sodium: 136 mmol/L (ref 135–145)

## 2018-03-09 LAB — MAGNESIUM: Magnesium: 2.2 mg/dL (ref 1.7–2.4)

## 2018-03-09 NOTE — Progress Notes (Signed)
Primary Care Physician: Aura Dials, MD EP: Dr. Albin Felling Carrie Potter is a 68 y.o. female with a h/o Afib, Tachy/brady syndrome, HTN, Persistent afib s/p 2 ablations. She asked to be seen for recent waves of lightheadedness while in SR with know brady usually in the low 40's and occassionally pt will see HR in the upper 30's. She has also seen higher readings of her BP at home. She has been under more stress. Her husband just had a stroke but is home recovering nicely, with minimal symptoms. She is retiring in the next 2 weeks and her husband went ahead and retired with the recent CVA. They have sold their home and are moving to [place] within the month. She has lost weight, down from 225 to 210 lbs in the last 2 months. She has had some breakthrough afib(1-2 hours)  but does not feel symptomatic with her afib. She is concerned that her bradycardia may be making her more symptomatic.  Today, she denies symptoms of palpitations, chest pain, shortness of breath, orthopnea, PND, lower extremity edema, dizziness, presyncope, syncope, or neurologic sequela. The patient is tolerating medications without difficulties and is otherwise without complaint today.   Past Medical History:  Diagnosis Date  . Asthma   . Atrial fibrillation ([place])   . Dysrhythmia    atrial fibrillation  . HTN (hypertension)   . Hyperlipidemia   . Hypothyroidism   . Obesity   . Palpitation   . Walking pneumonia ~ 10/2014   Past Surgical History:  Procedure Laterality Date  . ABDOMINAL HYSTERECTOMY    . ANKLE FRACTURE SURGERY Right ?1999  . ATRIAL FIBRILLATION ABLATION N/A 06/01/2017   Procedure: Atrial Fibrillation Ablation;  Surgeon: Thompson Grayer, MD;  Location: [place] [facility];  Service: Cardiovascular;  Laterality: N/A;  . CARDIOVERSION  03/08/2012   Procedure: CARDIOVERSION;  Surgeon: Candee Furbish, MD;  Location: [place];  Service: Cardiovascular;  Laterality: N/A;  . CARDIOVERSION N/A 05/02/2015   Procedure: CARDIOVERSION;  Surgeon: Jerline Pain, MD;  Location: [place];  Service: Cardiovascular;  Laterality: N/A;  . CARDIOVERSION N/A 05/16/2015   Procedure: CARDIOVERSION;  Surgeon: Sueanne Margarita, MD;  Location: [facility] ENDOSCOPY;  Service: Cardiovascular;  Laterality: N/A;  . CARDIOVERSION N/A 08/14/2015   Procedure: CARDIOVERSION;  Surgeon: Josue Hector, MD;  Location: [facility] ENDOSCOPY;  Service: Cardiovascular;  Laterality: N/A;  . CARDIOVERSION N/A 09/13/2015   Procedure: CARDIOVERSION;  Surgeon: Dorothy Spark, MD;  Location: [place];  Service: Cardiovascular;  Laterality: N/A;  . DILATION AND CURETTAGE OF UTERUS    . ELECTROPHYSIOLOGIC STUDY N/A 07/12/2015   Procedure: Atrial Fibrillation Ablation;  Surgeon: Thompson Grayer, MD;  Location: [place] [facility];  Service: Cardiovascular;  Laterality: N/A;  . FRACTURE SURGERY    . IR RADIOLOGY PERIPHERAL GUIDED IV START  05/26/2017  . IR US GUIDE VASC ACCESS RIGHT  05/26/2017  . TEE WITHOUT CARDIOVERSION N/A 07/11/2015   Procedure: TRANSESOPHAGEAL ECHOCARDIOGRAM (TEE);  Surgeon: Skeet Latch, MD;  Location: [facility] ENDOSCOPY;  Service: Cardiovascular;  Laterality: N/A;  . TONSILLECTOMY    . TUBAL LIGATION      Current Outpatient Medications  Medication Sig Dispense Refill  . acetaminophen (TYLENOL) 500 MG tablet Take 500-1,000 mg by mouth every 6 (six) hours as needed for mild pain or moderate pain.     Marland Kitchen dofetilide (TIKOSYN) 125 MCG capsule TAKE 3 CAPSULES (375 MCG TOTAL) BY MOUTH 2 (TWO) TIMES DAILY. 180 capsule 3  .  levothyroxine (SYNTHROID, LEVOTHROID) 112 MCG tablet Take 112 mcg by mouth daily before breakfast.    . Magnesium Oxide 400 (240 Mg) MG TABS TAKE 1 TABLET BY MOUTH EVERY DAY 30 tablet 11  . Multiple Vitamin (MULTIVITAMIN) tablet Take 1 tablet by mouth daily. Women's Gummies     . Probiotic Product (PROBIOTIC PO) Take 1 tablet by mouth daily.    Marland Kitchen spironolactone (ALDACTONE) 25 MG tablet Take 1 tablet (25 mg total) by  mouth 2 (two) times daily. 180 tablet 3  . tolterodine (DETROL) 2 MG tablet Take 2 mg by mouth daily.    . traMADol (ULTRAM) 50 MG tablet Take 1 tablet (50 mg total) by mouth every 6 (six) hours as needed. 30 tablet 0  . triazolam (HALCION) 0.25 MG tablet Take 0.25 mg by mouth as directed. Take before Dental appts.  0  . valACYclovir (VALTREX) 1000 MG tablet Take 1,000 mg by mouth 2 (two) times daily as needed. Cold sore    . XARELTO 20 MG TABS tablet TAKE 1 TABLET (20 MG TOTAL) BY MOUTH DAILY WITH SUPPER. 30 tablet 5   No current facility-administered medications for this encounter.     Allergies  Allergen Reactions  . Lisinopril Swelling  . Penicillins Hives    Has patient had a PCN reaction causing immediate rash, facial/tongue/throat swelling, SOB or lightheadedness with hypotension: Yes Has patient had a PCN reaction causing severe rash involving mucus membranes or skin necrosis: No Has patient had a PCN reaction that required hospitalization No Has patient had a PCN reaction occurring within the last 10 years: No If all of the above answers are "NO", then may proceed with Cephalosporin use.  . Diltiazem Rash  . Norvasc [Amlodipine] Rash    Social History   Socioeconomic History  . Marital status: Married    Spouse name: Not on file  . Number of children: Not on file  . Years of education: Not on file  . Highest education level: Not on file  Occupational History  . Not on file  Social Needs  . Financial resource strain: Not on file  . Food insecurity:    Worry: Not on file    Inability: Not on file  . Transportation needs:    Medical: Not on file    Non-medical: Not on file  Tobacco Use  . Smoking status: Former Smoker    Packs/day: 1.00    Years: 3.00    Pack years: 3.00    Types: Cigarettes  . Smokeless tobacco: Never Used  . Tobacco comment: "quit smoking ~ 1976"  Substance and Sexual Activity  . Alcohol use: Yes    Alcohol/week: 2.4 oz    Types: 4 Glasses  of wine per week  . Drug use: No  . Sexual activity: Yes  Lifestyle  . Physical activity:    Days per week: Not on file    Minutes per session: Not on file  . Stress: Not on file  Relationships  . Social connections:    Talks on phone: Not on file    Gets together: Not on file    Attends religious service: Not on file    Active member of club or organization: Not on file    Attends meetings of clubs or organizations: Not on file    Relationship status: Not on file  . Intimate partner violence:    Fear of current or ex partner: Not on file    Emotionally abused: Not on file  Physically abused: Not on file    Forced sexual activity: Not on file  Other Topics Concern  . Not on file  Social History Narrative  . Not on file    Family History  Problem Relation Age of Onset  . COPD Father   . Heart disease Father   . Cancer Father        skin  . Breast cancer Mother   . Cancer Mother        skin  . Hypertension Mother   . Heart attack Neg Hx   . Stroke Neg Hx     ROS- All systems are reviewed and negative except as per the HPI above  Physical Exam: Vitals:   03/09/18 1039  BP: (!) 160/84  Pulse: (!) 44  Weight: 210 lb (95.3 kg)  Height: 5\' 4"  (1.626 m)   Wt Readings from Last 3 Encounters:  03/09/18 210 lb (95.3 kg)  12/22/17 225 lb (102.1 kg)  09/01/17 226 lb 9.6 oz (102.8 kg)    Labs: Lab Results  Component Value Date   NA 136 03/09/2018   K 5.2 (H) 03/09/2018   CL 103 03/09/2018   CO2 26 03/09/2018   GLUCOSE 138 (H) 03/09/2018   BUN 20 03/09/2018   CREATININE 1.28 (H) 03/09/2018   CALCIUM 9.5 03/09/2018   MG 2.2 03/09/2018   No results found for: INR No results found for: CHOL, HDL, LDLCALC, TRIG   GEN- The patient is well appearing, alert and oriented x 3 today.   Head- normocephalic, atraumatic Eyes-  Sclera clear, conjunctiva pink Ears- hearing intact Oropharynx- clear Neck- supple, no JVP Lymph- no cervical lymphadenopathy Lungs-  Clear to ausculation bilaterally, normal work of breathing Heart- Slow regular rate and rhythm, no murmurs, rubs or gallops, PMI not laterally displaced GI- soft, NT, ND, + BS Extremities- no clubbing, cyanosis, or edema MS- no significant deformity or atrophy Skin- no rash or lesion Psych- euthymic mood, full affect Neuro- strength and sensation are intact  EKG- Marked sinus bradycardia at 44 bpm, pr int 196 ms, qrs int 76 ms, qtc 442 ms Epic records reviewed    Assessment and Plan: 1. Afib Appears stable Very infrequent breakthrough of afib and only last a few hours Continue Tikosyn 125 mcg bid, qtc stable Bmet/amg today Congratulated on weight loss  2. Spells of lightheadedness/symptomatic bradycardia Offered pt a Linq but she deferred but will wear a Holter monitor x 48 hours to look for indication for pacemaker  3. HTN Elevated over the last few days May be a compensatory  mechanism for brady May be related to increased stress Continue to watch for now If needs additional antihypertensive, best option may be hydralazine   Butch Penny C. Wava Kildow, [facility] [address] [place], [place] [address] [phone]

## 2018-03-10 ENCOUNTER — Other Ambulatory Visit (HOSPITAL_COMMUNITY): Payer: Self-pay | Admitting: *Deleted

## 2018-03-10 DIAGNOSIS — I4819 Other persistent atrial fibrillation: Secondary | ICD-10-CM

## 2018-03-15 ENCOUNTER — Ambulatory Visit (INDEPENDENT_AMBULATORY_CARE_PROVIDER_SITE_OTHER): Payer: BC Managed Care – PPO

## 2018-03-15 ENCOUNTER — Other Ambulatory Visit: Payer: BC Managed Care – PPO | Admitting: *Deleted

## 2018-03-15 DIAGNOSIS — I481 Persistent atrial fibrillation: Secondary | ICD-10-CM | POA: Diagnosis not present

## 2018-03-15 DIAGNOSIS — I4819 Other persistent atrial fibrillation: Secondary | ICD-10-CM

## 2018-03-16 LAB — BASIC METABOLIC PANEL
BUN/Creatinine Ratio: 16 (ref 12–28)
BUN: 16 mg/dL (ref 8–27)
CALCIUM: 9.7 mg/dL (ref 8.7–10.3)
CO2: 25 mmol/L (ref 20–29)
CREATININE: 0.99 mg/dL (ref 0.57–1.00)
Chloride: 102 mmol/L (ref 96–106)
GFR calc non Af Amer: 60 mL/min/{1.73_m2} (ref >59)
GFR, EST AFRICAN AMERICAN: 69 mL/min/{1.73_m2} (ref >59)
GLUCOSE: 102 mg/dL — AB (ref 65–99)
Potassium: 4.7 mmol/L (ref 3.5–5.2)
Sodium: 142 mmol/L (ref 134–144)

## 2018-04-13 ENCOUNTER — Other Ambulatory Visit: Payer: Self-pay | Admitting: Nurse Practitioner

## 2018-05-02 ENCOUNTER — Ambulatory Visit: Payer: Medicare Other | Admitting: Internal Medicine

## 2018-05-02 ENCOUNTER — Encounter: Payer: Self-pay | Admitting: Internal Medicine

## 2018-05-02 VITALS — BP 144/76 | HR 46 | Ht 64.0 in | Wt 213.4 lb

## 2018-05-02 DIAGNOSIS — I10 Essential (primary) hypertension: Secondary | ICD-10-CM

## 2018-05-02 DIAGNOSIS — I481 Persistent atrial fibrillation: Secondary | ICD-10-CM

## 2018-05-02 DIAGNOSIS — I4819 Other persistent atrial fibrillation: Secondary | ICD-10-CM

## 2018-05-02 DIAGNOSIS — R001 Bradycardia, unspecified: Secondary | ICD-10-CM | POA: Diagnosis not present

## 2018-05-02 NOTE — Progress Notes (Signed)
PCP: Aura Dials, MD   Primary EP: Dr Etheleen Sia is a 67 y.o. female who presents today for routine electrophysiology followup.  Since last being seen in our clinic, the patient reports doing reasonably well.  She recently retired, moved to [employer] and her husband had a stroke.  With this, she did have episodes of Afib.  Her afib has since improved.  Today, she denies symptoms of palpitations, chest pain, shortness of breath,  lower extremity edema, dizziness, presyncope, or syncope.  The patient is otherwise without complaint today.   Past Medical History:  Diagnosis Date  . Asthma   . Atrial fibrillation ([place])   . Dysrhythmia    atrial fibrillation  . HTN (hypertension)   . Hyperlipidemia   . Hypothyroidism   . Obesity   . Palpitation   . Walking pneumonia ~ 10/2014   Past Surgical History:  Procedure Laterality Date  . ABDOMINAL HYSTERECTOMY    . ANKLE FRACTURE SURGERY Right ?1999  . ATRIAL FIBRILLATION ABLATION N/A 06/01/2017   Procedure: Atrial Fibrillation Ablation;  Surgeon: Thompson Grayer, MD;  Location: [place] [facility];  Service: Cardiovascular;  Laterality: N/A;  . CARDIOVERSION  03/08/2012   Procedure: CARDIOVERSION;  Surgeon: Candee Furbish, MD;  Location: [place];  Service: Cardiovascular;  Laterality: N/A;  . CARDIOVERSION N/A 05/02/2015   Procedure: CARDIOVERSION;  Surgeon: Jerline Pain, MD;  Location: [place];  Service: Cardiovascular;  Laterality: N/A;  . CARDIOVERSION N/A 05/16/2015   Procedure: CARDIOVERSION;  Surgeon: Sueanne Margarita, MD;  Location: [facility] ENDOSCOPY;  Service: Cardiovascular;  Laterality: N/A;  . CARDIOVERSION N/A 08/14/2015   Procedure: CARDIOVERSION;  Surgeon: Josue Hector, MD;  Location: [facility] ENDOSCOPY;  Service: Cardiovascular;  Laterality: N/A;  . CARDIOVERSION N/A 09/13/2015   Procedure: CARDIOVERSION;  Surgeon: Dorothy Spark, MD;  Location: [place];  Service: Cardiovascular;  Laterality: N/A;  . DILATION AND  CURETTAGE OF UTERUS    . ELECTROPHYSIOLOGIC STUDY N/A 07/12/2015   Procedure: Atrial Fibrillation Ablation;  Surgeon: Thompson Grayer, MD;  Location: [place] [facility];  Service: Cardiovascular;  Laterality: N/A;  . FRACTURE SURGERY    . IR RADIOLOGY PERIPHERAL GUIDED IV START  05/26/2017  . IR US GUIDE VASC ACCESS RIGHT  05/26/2017  . TEE WITHOUT CARDIOVERSION N/A 07/11/2015   Procedure: TRANSESOPHAGEAL ECHOCARDIOGRAM (TEE);  Surgeon: Skeet Latch, MD;  Location: [facility] ENDOSCOPY;  Service: Cardiovascular;  Laterality: N/A;  . TONSILLECTOMY    . TUBAL LIGATION      ROS- all systems are reviewed and negatives except as per HPI above  Current Outpatient Medications  Medication Sig Dispense Refill  . acetaminophen (TYLENOL) 500 MG tablet Take 500-1,000 mg by mouth every 6 (six) hours as needed for mild pain or moderate pain.     Marland Kitchen dofetilide (TIKOSYN) 125 MCG capsule TAKE 3 CAPSULES (375 MCG TOTAL) BY MOUTH 2 (TWO) TIMES DAILY. 180 capsule 3  . Magnesium Oxide 400 (240 Mg) MG TABS TAKE 1 TABLET BY MOUTH EVERY DAY 30 tablet 11  . Multiple Vitamin (MULTIVITAMIN) tablet Take 1 tablet by mouth daily. Women's Gummies     . Probiotic Product (PROBIOTIC PO) Take 1 tablet by mouth daily.    Marland Kitchen spironolactone (ALDACTONE) 25 MG tablet Take 1 tablet (25 mg total) by mouth 2 (two) times daily. 180 tablet 3  . tolterodine (DETROL) 2 MG tablet Take 2 mg by mouth daily.    . traMADol (ULTRAM) 50 MG tablet Take 1 tablet (  50 mg total) by mouth every 6 (six) hours as needed. 30 tablet 0  . triazolam (HALCION) 0.25 MG tablet Take 0.25 mg by mouth as directed. Take before Dental appts.  0  . valACYclovir (VALTREX) 1000 MG tablet Take 1,000 mg by mouth 2 (two) times daily as needed. Cold sore    . XARELTO 20 MG TABS tablet TAKE 1 TABLET (20 MG TOTAL) BY MOUTH DAILY WITH SUPPER. 30 tablet 5  . levothyroxine (SYNTHROID, LEVOTHROID) 100 MCG tablet Take 1 tablet by mouth daily.  1   No current facility-administered  medications for this visit.     Physical Exam: Vitals:   05/02/18 1433  BP: (!) 144/76  Pulse: (!) 46  SpO2: 98%  Weight: 213 lb 6.4 oz (96.8 kg)  Height: 5\' 4"  (1.626 m)    GEN- The patient is well appearing, alert and oriented x 3 today.   Head- normocephalic, atraumatic Eyes-  Sclera clear, conjunctiva pink Ears- hearing intact Oropharynx- clear Lungs- Clear to ausculation bilaterally, normal work of breathing Heart- Regular rate and rhythm, no murmurs, rubs or gallops, PMI not laterally displaced GI- soft, NT, ND, + BS Extremities- no clubbing, cyanosis, or edema  Wt Readings from Last 3 Encounters:  05/02/18 213 lb 6.4 oz (96.8 kg)  03/09/18 210 lb (95.3 kg)  12/22/17 225 lb (102.1 kg)    EKG tracing ordered today is personally reviewed and shows sinus bradycardia 46 bpm, PR 196 msec, QRS 86 msec, Qtc 449 msec., septic infarct  Assessment and Plan:  1. Persistent afib chads2vasc score is 3.   continue tikosyn Hopefully as her life change improves, her AF will also abate.  If she continues to have afib, we would need to consider repeat ablation.  Amiodarone would be our only other real AAD option and she would like to avoid this.  She also wishes to avoid PPM which complicates management to some degree.  2. HTN Stable No change required today  3. Sinus bradycardia Asymptomatic Recent holter reviewed She declines PPM at this time.  4. Obesity Body mass index is 36.63 kg/m. She has done well with lifestyle change  Return in 3 months She is considering establishing with EP in [place] also.  Thompson Grayer MD, [facility] 05/02/2018 2:41 PM

## 2018-05-02 NOTE — Patient Instructions (Addendum)
Medication Instructions:  Your physician recommends that you continue on your current medications as directed. Please refer to the Current Medication list given to you today.  Labwork: None ordered.  Testing/Procedures: None ordered.  Follow-Up: Your physician wants you to follow-up in: 3 months with Dr. Allred.      Any Other Special Instructions Will Be Listed Below (If Applicable).  If you need a refill on your cardiac medications before your next appointment, please call your pharmacy.   

## 2018-05-31 ENCOUNTER — Other Ambulatory Visit: Payer: Self-pay | Admitting: Internal Medicine

## 2018-05-31 MED ORDER — RIVAROXABAN 20 MG PO TABS: 20.0000 mg | ORAL_TABLET | Freq: Every day | ORAL | 5 refills | Status: DC

## 2018-05-31 NOTE — Addendum Note (Signed)
Addended by: Lux Meaders E on: 05/31/2018 11:36 AM   Modules accepted: Orders

## 2018-07-27 ENCOUNTER — Other Ambulatory Visit: Payer: Self-pay | Admitting: Nurse Practitioner

## 2018-07-27 NOTE — Telephone Encounter (Signed)
This is a A-fib clinic pt.

## 2018-08-15 ENCOUNTER — Ambulatory Visit: Payer: Self-pay | Admitting: Internal Medicine

## 2018-08-17 ENCOUNTER — Encounter: Payer: Self-pay | Admitting: Internal Medicine

## 2018-08-17 ENCOUNTER — Ambulatory Visit: Payer: Medicare Other | Admitting: Internal Medicine

## 2018-08-17 VITALS — BP 146/70 | HR 49 | Ht 63.0 in | Wt 219.4 lb

## 2018-08-17 DIAGNOSIS — I10 Essential (primary) hypertension: Secondary | ICD-10-CM

## 2018-08-17 DIAGNOSIS — R001 Bradycardia, unspecified: Secondary | ICD-10-CM | POA: Diagnosis not present

## 2018-08-17 DIAGNOSIS — I4819 Other persistent atrial fibrillation: Secondary | ICD-10-CM

## 2018-08-17 NOTE — Progress Notes (Signed)
PCP: Aura Dials, MD   Primary EP: Dr Etheleen Sia is a 67 y.o. female who presents today for routine electrophysiology followup.  Since last being seen in our clinic, the patient reports doing very well.  She had a single episode of afib after international travel in October.  No other AF episodes.  Today, she denies symptoms of palpitations, chest pain, shortness of breath,  lower extremity edema, dizziness, presyncope, or syncope.  The patient is otherwise without complaint today.   Past Medical History:  Diagnosis Date  . Asthma   . Atrial fibrillation ([place])   . Dysrhythmia    atrial fibrillation  . HTN (hypertension)   . Hyperlipidemia   . Hypothyroidism   . Obesity   . Palpitation   . Walking pneumonia ~ 10/2014   Past Surgical History:  Procedure Laterality Date  . ABDOMINAL HYSTERECTOMY    . ANKLE FRACTURE SURGERY Right ?1999  . ATRIAL FIBRILLATION ABLATION N/A 06/01/2017   Procedure: Atrial Fibrillation Ablation;  Surgeon: Thompson Grayer, MD;  Location: [place] [facility];  Service: Cardiovascular;  Laterality: N/A;  . CARDIOVERSION  03/08/2012   Procedure: CARDIOVERSION;  Surgeon: Candee Furbish, MD;  Location: [place];  Service: Cardiovascular;  Laterality: N/A;  . CARDIOVERSION N/A 05/02/2015   Procedure: CARDIOVERSION;  Surgeon: Jerline Pain, MD;  Location: [place];  Service: Cardiovascular;  Laterality: N/A;  . CARDIOVERSION N/A 05/16/2015   Procedure: CARDIOVERSION;  Surgeon: Sueanne Margarita, MD;  Location: [facility] ENDOSCOPY;  Service: Cardiovascular;  Laterality: N/A;  . CARDIOVERSION N/A 08/14/2015   Procedure: CARDIOVERSION;  Surgeon: Josue Hector, MD;  Location: [facility] ENDOSCOPY;  Service: Cardiovascular;  Laterality: N/A;  . CARDIOVERSION N/A 09/13/2015   Procedure: CARDIOVERSION;  Surgeon: Dorothy Spark, MD;  Location: [place];  Service: Cardiovascular;  Laterality: N/A;  . DILATION AND CURETTAGE OF UTERUS    . ELECTROPHYSIOLOGIC STUDY N/A  07/12/2015   Procedure: Atrial Fibrillation Ablation;  Surgeon: Thompson Grayer, MD;  Location: [place] [facility];  Service: Cardiovascular;  Laterality: N/A;  . FRACTURE SURGERY    . IR RADIOLOGY PERIPHERAL GUIDED IV START  05/26/2017  . IR US GUIDE VASC ACCESS RIGHT  05/26/2017  . TEE WITHOUT CARDIOVERSION N/A 07/11/2015   Procedure: TRANSESOPHAGEAL ECHOCARDIOGRAM (TEE);  Surgeon: Skeet Latch, MD;  Location: [facility] ENDOSCOPY;  Service: Cardiovascular;  Laterality: N/A;  . TONSILLECTOMY    . TUBAL LIGATION      ROS- all systems are reviewed and negatives except as per HPI above  Current Outpatient Medications  Medication Sig Dispense Refill  . acetaminophen (TYLENOL) 500 MG tablet Take 500-1,000 mg by mouth every 6 (six) hours as needed for mild pain or moderate pain.     Marland Kitchen dofetilide (TIKOSYN) 125 MCG capsule TAKE 3 CAPSULES (375 MCG TOTAL) BY MOUTH 2 (TWO) TIMES DAILY. 180 capsule 3  . levothyroxine (SYNTHROID, LEVOTHROID) 100 MCG tablet Take 1 tablet by mouth daily.  1  . Magnesium Oxide 400 (240 Mg) MG TABS TAKE 1 TABLET BY MOUTH EVERY DAY 30 tablet 11  . Multiple Vitamin (MULTIVITAMIN) tablet Take 1 tablet by mouth daily. Women's Gummies     . Probiotic Product (PROBIOTIC PO) Take 1 tablet by mouth daily.    . rivaroxaban (XARELTO) 20 MG TABS tablet Take 1 tablet (20 mg total) by mouth daily with supper. 30 tablet 5  . spironolactone (ALDACTONE) 25 MG tablet Take 1 tablet (25 mg total) by mouth 2 (two) times daily.  180 tablet 3  . tolterodine (DETROL) 2 MG tablet Take 2 mg by mouth daily.    . traMADol (ULTRAM) 50 MG tablet Take 1 tablet (50 mg total) by mouth every 6 (six) hours as needed. 30 tablet 0  . triazolam (HALCION) 0.25 MG tablet Take 0.25 mg by mouth as directed. Take before Dental appts.  0  . valACYclovir (VALTREX) 1000 MG tablet Take 1,000 mg by mouth 2 (two) times daily as needed. Cold sore     No current facility-administered medications for this visit.     Physical  Exam: Vitals:   08/17/18 1425  BP: (!) 146/70  Pulse: (!) 49  SpO2: 99%  Weight: 219 lb 6.4 oz (99.5 kg)  Height: 5\' 3"  (1.6 m)    GEN- The patient is well appearing, alert and oriented x 3 today.   Head- normocephalic, atraumatic Eyes-  Sclera clear, conjunctiva pink Ears- hearing intact Oropharynx- clear Lungs- Clear to ausculation bilaterally, normal work of breathing Heart- Regular rate and rhythm, no murmurs, rubs or gallops, PMI not laterally displaced GI- soft, NT, ND, + BS Extremities- no clubbing, cyanosis, or edema  Wt Readings from Last 3 Encounters:  08/17/18 219 lb 6.4 oz (99.5 kg)  05/02/18 213 lb 6.4 oz (96.8 kg)  03/09/18 210 lb (95.3 kg)    EKG tracing ordered today is personally reviewed and shows sinus rhythm, 49 bpm QTc 446 msec  Assessment and Plan:  1. Persistent afib chads2vasc score is 3.  On tikosyn Doing well No changes  2. Sinus bradycardia Stable She has declined pacing previously.  Asymptomatic currently and feels that her V rates have improved  3. HTN Stable No change required today  4. Obesity Body mass index is 38.86 kg/m. Lifestyle modification encouraged  Follow-up in AF clinic in 3 months  Thompson Grayer MD, [facility] 08/17/2018 2:43 PM

## 2018-08-17 NOTE — Patient Instructions (Addendum)
  Medication Instructions:  Your physician recommends that you continue on your current medications as directed. Please refer to the Current Medication list given to you today.  Labwork: None ordered.  Testing/Procedures: None ordered.  Follow-Up: Your physician recommends that you schedule a follow-up appointment in:   3 months in the Afib Clinic with Malka So, PA  Any Other Special Instructions Will Be Listed Below (If Applicable).     If you need a refill on your cardiac medications before your next appointment, please call your pharmacy.

## 2018-09-29 SURGERY — Surgical Case
Anesthesia: *Unknown

## 2018-11-09 ENCOUNTER — Other Ambulatory Visit: Payer: Self-pay | Admitting: Internal Medicine

## 2018-11-16 ENCOUNTER — Ambulatory Visit (HOSPITAL_COMMUNITY)
Admission: RE | Admit: 2018-11-16 | Discharge: 2018-11-16 | Disposition: A | Discharge status: Home / Self Care | Payer: Medicare Other | Source: Ambulatory Visit | Attending: Nurse Practitioner | Admitting: Nurse Practitioner

## 2018-11-16 ENCOUNTER — Encounter (HOSPITAL_COMMUNITY): Payer: Self-pay | Admitting: Physician Assistant

## 2018-11-16 VITALS — BP 132/74 | HR 43 | Ht 63.0 in | Wt 234.0 lb

## 2018-11-16 DIAGNOSIS — Z888 Allergy status to other drugs, medicaments and biological substances status: Secondary | ICD-10-CM | POA: Insufficient documentation

## 2018-11-16 DIAGNOSIS — E039 Hypothyroidism, unspecified: Secondary | ICD-10-CM | POA: Diagnosis not present

## 2018-11-16 DIAGNOSIS — R42 Dizziness and giddiness: Secondary | ICD-10-CM | POA: Insufficient documentation

## 2018-11-16 DIAGNOSIS — I10 Essential (primary) hypertension: Secondary | ICD-10-CM | POA: Diagnosis not present

## 2018-11-16 DIAGNOSIS — I480 Paroxysmal atrial fibrillation: Secondary | ICD-10-CM

## 2018-11-16 DIAGNOSIS — R001 Bradycardia, unspecified: Secondary | ICD-10-CM | POA: Insufficient documentation

## 2018-11-16 DIAGNOSIS — Z87891 Personal history of nicotine dependence: Secondary | ICD-10-CM | POA: Insufficient documentation

## 2018-11-16 DIAGNOSIS — I4819 Other persistent atrial fibrillation: Secondary | ICD-10-CM | POA: Insufficient documentation

## 2018-11-16 DIAGNOSIS — Z836 Family history of other diseases of the respiratory system: Secondary | ICD-10-CM | POA: Diagnosis not present

## 2018-11-16 DIAGNOSIS — R9431 Abnormal electrocardiogram [ECG] [EKG]: Secondary | ICD-10-CM | POA: Diagnosis not present

## 2018-11-16 DIAGNOSIS — Z79890 Hormone replacement therapy (postmenopausal): Secondary | ICD-10-CM | POA: Insufficient documentation

## 2018-11-16 DIAGNOSIS — Z808 Family history of malignant neoplasm of other organs or systems: Secondary | ICD-10-CM | POA: Insufficient documentation

## 2018-11-16 DIAGNOSIS — E669 Obesity, unspecified: Secondary | ICD-10-CM | POA: Diagnosis not present

## 2018-11-16 DIAGNOSIS — J45909 Unspecified asthma, uncomplicated: Secondary | ICD-10-CM | POA: Insufficient documentation

## 2018-11-16 DIAGNOSIS — Z803 Family history of malignant neoplasm of breast: Secondary | ICD-10-CM | POA: Insufficient documentation

## 2018-11-16 DIAGNOSIS — Z880 Allergy status to penicillin: Secondary | ICD-10-CM | POA: Insufficient documentation

## 2018-11-16 DIAGNOSIS — Z882 Allergy status to sulfonamides status: Secondary | ICD-10-CM | POA: Insufficient documentation

## 2018-11-16 DIAGNOSIS — Z7901 Long term (current) use of anticoagulants: Secondary | ICD-10-CM | POA: Diagnosis not present

## 2018-11-16 DIAGNOSIS — Z6841 Body Mass Index (BMI) 40.0 and over, adult: Secondary | ICD-10-CM | POA: Diagnosis not present

## 2018-11-16 DIAGNOSIS — Z79899 Other long term (current) drug therapy: Secondary | ICD-10-CM | POA: Insufficient documentation

## 2018-11-16 LAB — BASIC METABOLIC PANEL
Anion gap: 9 (ref 5–15)
BUN: 22 mg/dL (ref 8–23)
CO2: 28 mmol/L (ref 22–32)
Calcium: 9.6 mg/dL (ref 8.9–10.3)
Chloride: 102 mmol/L (ref 98–111)
Creatinine, Ser: 0.94 mg/dL (ref 0.44–1.00)
GFR calc Af Amer: >60 mL/min (ref >60)
GFR calc non Af Amer: >60 mL/min (ref >60)
GLUCOSE: 100 mg/dL — AB (ref 70–99)
Potassium: 4.9 mmol/L (ref 3.5–5.1)
Sodium: 139 mmol/L (ref 135–145)

## 2018-11-16 LAB — MAGNESIUM: Magnesium: 2.3 mg/dL (ref 1.7–2.4)

## 2018-11-16 NOTE — Progress Notes (Signed)
Primary Care Physician: Aura Dials, MD EP: Dr. Albin Felling Carrie Potter is a 68 y.o. female with a h/o Afib, Tachy/brady syndrome, HTN, Persistent afib s/p 2 ablations. She asked to be seen for recent waves of lightheadedness while in SR with know brady usually in the low 40's and occassionally pt will see HR in the upper 30's. She has also seen higher readings of her BP at home. She has been under more stress. Her husband just had a stroke but is home recovering nicely, with minimal symptoms. She is retiring in the next 2 weeks and her husband went ahead and retired with the recent CVA. They have sold their home and are moving to [place] within the month. She has lost weight, down from 225 to 210 lbs in the last 2 months. She has had some breakthrough afib(1-2 hours)  but does not feel symptomatic with her afib. She is concerned that her bradycardia may be making her more symptomatic.  F/u in afib clinic 3/4. She and her husband are both retired and living at [facility]. She continues on low dose Tikosyn.She has had afib a couple of times a month from a few hours to half the day. Currently this is acceptable to pt. She has not has any significant presyncopal episodes recently. She does not use any prn rate control for afib episodes as she has brady at baseline. She is in S brady today at 43 bpm.  Today, she denies symptoms of palpitations, chest pain, shortness of breath, orthopnea, PND, lower extremity edema, dizziness, presyncope, syncope, or neurologic sequela. The patient is tolerating medications without difficulties and is otherwise without complaint today.   Past Medical History:  Diagnosis Date  . Asthma   . Atrial fibrillation ([place])   . Dysrhythmia    atrial fibrillation  . HTN (hypertension)   . Hyperlipidemia   . Hypothyroidism   . Obesity   . Palpitation   . Walking pneumonia ~ 10/2014   Past Surgical History:  Procedure Laterality Date  . ABDOMINAL HYSTERECTOMY      . ANKLE FRACTURE SURGERY Right ?1999  . ATRIAL FIBRILLATION ABLATION N/A 06/01/2017   Procedure: Atrial Fibrillation Ablation;  Surgeon: Thompson Grayer, MD;  Location: [facility];  Service: Cardiovascular;  Laterality: N/A;  . CARDIOVERSION  03/08/2012   Procedure: CARDIOVERSION;  Surgeon: Candee Furbish, MD;  Location: [facility];  Service: Cardiovascular;  Laterality: N/A;  . CARDIOVERSION N/A 05/02/2015   Procedure: CARDIOVERSION;  Surgeon: Jerline Pain, MD;  Location: [facility];  Service: Cardiovascular;  Laterality: N/A;  . CARDIOVERSION N/A 05/16/2015   Procedure: CARDIOVERSION;  Surgeon: Sueanne Margarita, MD;  Location: [facility];  Service: Cardiovascular;  Laterality: N/A;  . CARDIOVERSION N/A 08/14/2015   Procedure: CARDIOVERSION;  Surgeon: Josue Hector, MD;  Location: [facility] ENDOSCOPY;  Service: Cardiovascular;  Laterality: N/A;  . CARDIOVERSION N/A 09/13/2015   Procedure: CARDIOVERSION;  Surgeon: Dorothy Spark, MD;  Location: [facility];  Service: Cardiovascular;  Laterality: N/A;  . DILATION AND CURETTAGE OF UTERUS    . ELECTROPHYSIOLOGIC STUDY N/A 07/12/2015   Procedure: Atrial Fibrillation Ablation;  Surgeon: Thompson Grayer, MD;  Location: [facility];  Service: Cardiovascular;  Laterality: N/A;  . FRACTURE SURGERY    . IR RADIOLOGY PERIPHERAL GUIDED IV START  05/26/2017  . IR US GUIDE VASC ACCESS RIGHT  05/26/2017  . TEE WITHOUT CARDIOVERSION N/A 07/11/2015   Procedure: TRANSESOPHAGEAL ECHOCARDIOGRAM (TEE);  Surgeon: Skeet Latch,  MD;  Location: [facility] ENDOSCOPY;  Service: Cardiovascular;  Laterality: N/A;  . TONSILLECTOMY    . TUBAL LIGATION      Current Outpatient Medications  Medication Sig Dispense Refill  . acetaminophen (TYLENOL) 500 MG tablet Take 500-1,000 mg by mouth every 6 (six) hours as needed for mild pain or moderate pain.     Marland Kitchen dofetilide (TIKOSYN) 125 MCG capsule TAKE 3 CAPSULES (375 MCG TOTAL) BY MOUTH 2 (TWO) TIMES DAILY. 180 capsule 3  .  levothyroxine (SYNTHROID, LEVOTHROID) 100 MCG tablet Take 1 tablet by mouth daily.  1  . Magnesium Oxide 400 (240 Mg) MG TABS TAKE 1 TABLET BY MOUTH EVERY DAY 30 tablet 11  . Multiple Vitamin (MULTIVITAMIN) tablet Take 1 tablet by mouth daily. Women's Gummies     . Probiotic Product (PROBIOTIC PO) Take 1 tablet by mouth daily.    . rivaroxaban (XARELTO) 20 MG TABS tablet Take 1 tablet (20 mg total) by mouth daily with supper. 30 tablet 5  . spironolactone (ALDACTONE) 25 MG tablet Take 1 tablet (25 mg total) by mouth 2 (two) times daily. 180 tablet 3  . tolterodine (DETROL) 2 MG tablet Take 2 mg by mouth daily.    . traMADol (ULTRAM) 50 MG tablet Take 1 tablet (50 mg total) by mouth every 6 (six) hours as needed. 30 tablet 0  . valACYclovir (VALTREX) 1000 MG tablet Take 1,000 mg by mouth 2 (two) times daily as needed. Cold sore    . triazolam (HALCION) 0.25 MG tablet Take 0.25 mg by mouth as directed. Take before Dental appts.  0   No current facility-administered medications for this encounter.     Allergies  Allergen Reactions  . Lisinopril Swelling  . Penicillins Hives    Has patient had a PCN reaction causing immediate rash, facial/tongue/throat swelling, SOB or lightheadedness with hypotension: Yes Has patient had a PCN reaction causing severe rash involving mucus membranes or skin necrosis: No Has patient had a PCN reaction that required hospitalization No Has patient had a PCN reaction occurring within the last 10 years: No If all of the above answers are "NO", then may proceed with Cephalosporin use.  . Diltiazem Rash  . Norvasc [Amlodipine] Rash    Social History   Socioeconomic History  . Marital status: Married    Spouse name: Not on file  . Number of children: Not on file  . Years of education: Not on file  . Highest education level: Not on file  Occupational History  . Not on file  Social Needs  . Financial resource strain: Not on file  . Food insecurity:    Worry:  Not on file    Inability: Not on file  . Transportation needs:    Medical: Not on file    Non-medical: Not on file  Tobacco Use  . Smoking status: Former Smoker    Packs/day: 1.00    Years: 3.00    Pack years: 3.00    Types: Cigarettes  . Smokeless tobacco: Never Used  . Tobacco comment: "quit smoking ~ 1976"  Substance and Sexual Activity  . Alcohol use: Yes    Alcohol/week: 4.0 standard drinks    Types: 4 Glasses of wine per week  . Drug use: No  . Sexual activity: Yes  Lifestyle  . Physical activity:    Days per week: Not on file    Minutes per session: Not on file  . Stress: Not on file  Relationships  . Social connections:  Talks on phone: Not on file    Gets together: Not on file    Attends religious service: Not on file    Active member of club or organization: Not on file    Attends meetings of clubs or organizations: Not on file    Relationship status: Not on file  . Intimate partner violence:    Fear of current or ex partner: Not on file    Emotionally abused: Not on file    Physically abused: Not on file    Forced sexual activity: Not on file  Other Topics Concern  . Not on file  Social History Narrative  . Not on file    Family History  Problem Relation Age of Onset  . COPD Father   . Heart disease Father   . Cancer Father        skin  . Breast cancer Mother   . Cancer Mother        skin  . Hypertension Mother   . Heart attack Neg Hx   . Stroke Neg Hx     ROS- All systems are reviewed and negative except as per the HPI above  Physical Exam: Vitals:   11/16/18 1358  BP: 132/74  Pulse: (!) 43  Weight: 106.1 kg  Height: 5\' 3"  (1.6 m)   Wt Readings from Last 3 Encounters:  11/16/18 106.1 kg  08/17/18 99.5 kg  05/02/18 96.8 kg    Labs: Lab Results  Component Value Date   NA 139 11/16/2018   K 4.9 11/16/2018   CL 102 11/16/2018   CO2 28 11/16/2018   GLUCOSE 100 (H) 11/16/2018   BUN 22 11/16/2018   CREATININE 0.94 11/16/2018    CALCIUM 9.6 11/16/2018   MG 2.3 11/16/2018   No results found for: INR No results found for: CHOL, HDL, LDLCALC, TRIG   GEN- The patient is well appearing, alert and oriented x 3 today.   Head- normocephalic, atraumatic Eyes-  Sclera clear, conjunctiva pink Ears- hearing intact Oropharynx- clear Neck- supple, no JVP Lymph- no cervical lymphadenopathy Lungs- Clear to ausculation bilaterally, normal work of breathing Heart- Slow regular rate and rhythm, no murmurs, rubs or gallops, PMI not laterally displaced GI- soft, NT, ND, + BS Extremities- no clubbing, cyanosis, or edema MS- no significant deformity or atrophy Skin- no rash or lesion Psych- euthymic mood, full affect Neuro- strength and sensation are intact  EKG- Marked sinus bradycardia at 43 bpm, pr int 184 ms, qrs int 74 ms, qtc 452 ms Epic records reviewed    Assessment and Plan: 1. Afib Appears at her usual pattern of break through afib Continue Tikosyn 125 mcg bid, qtc stable Bmet/amg today  2. Spells of lightheadedness/symptomatic bradycardia Currently  quiet   3. HTN Stable   F/u in afib clinic in 6 months  Butch Penny C. Makira Holleman, [facility] [address] [place], [place] [address] [phone]

## 2018-11-22 ENCOUNTER — Other Ambulatory Visit: Payer: Self-pay | Admitting: Nurse Practitioner

## 2018-11-27 ENCOUNTER — Other Ambulatory Visit: Payer: Self-pay | Admitting: Internal Medicine

## 2018-12-01 ENCOUNTER — Other Ambulatory Visit (HOSPITAL_COMMUNITY): Payer: Self-pay | Admitting: *Deleted

## 2018-12-01 MED ORDER — RIVAROXABAN 20 MG PO TABS: 20.0000 mg | ORAL_TABLET | Freq: Every day | ORAL | 2 refills | Status: DC

## 2019-02-03 ENCOUNTER — Other Ambulatory Visit: Payer: Self-pay | Admitting: Internal Medicine

## 2019-02-17 ENCOUNTER — Other Ambulatory Visit: Payer: Self-pay | Admitting: Nurse Practitioner

## 2019-02-23 ENCOUNTER — Other Ambulatory Visit (HOSPITAL_COMMUNITY): Payer: Self-pay | Admitting: *Deleted

## 2019-02-23 MED ORDER — RIVAROXABAN 20 MG PO TABS: 20.0000 mg | ORAL_TABLET | Freq: Every day | ORAL | 2 refills | Status: DC

## 2019-02-23 MED ORDER — MAGNESIUM OXIDE -MG SUPPLEMENT 400 (240 MG) MG PO TABS: 1.0000 | ORAL_TABLET | Freq: Every day | ORAL | 2 refills | Status: DC

## 2019-03-13 ENCOUNTER — Telehealth: Payer: Self-pay | Admitting: Internal Medicine

## 2019-03-13 ENCOUNTER — Telehealth (HOSPITAL_COMMUNITY): Payer: Self-pay | Admitting: *Deleted

## 2019-03-13 MED ORDER — OXYCODONE HCL 5 MG PO TABS
5.00 | ORAL_TABLET | ORAL | Status: DC
Start: ? — End: 2019-03-13

## 2019-03-13 MED ORDER — GENERIC EXTERNAL MEDICATION
Status: DC
Start: ? — End: 2019-03-13

## 2019-03-13 MED ORDER — GENERIC EXTERNAL MEDICATION
10.00 | Status: DC
Start: 2019-03-17 — End: 2019-03-13

## 2019-03-13 MED ORDER — OXYBUTYNIN CHLORIDE 5 MG PO TABS
5.00 | ORAL_TABLET | ORAL | Status: DC
Start: 2019-03-17 — End: 2019-03-13

## 2019-03-13 MED ORDER — LEVOTHYROXINE SODIUM 100 MCG PO TABS
100.00 | ORAL_TABLET | ORAL | Status: DC
Start: 2019-03-18 — End: 2019-03-13

## 2019-03-13 MED ORDER — RIVAROXABAN 20 MG PO TABS
20.00 | ORAL_TABLET | ORAL | Status: DC
Start: 2019-03-13 — End: 2019-03-13

## 2019-03-13 MED ORDER — GENERIC EXTERNAL MEDICATION
375.00 | Status: DC
Start: 2019-03-13 — End: 2019-03-13

## 2019-03-13 MED ORDER — NITROGLYCERIN 0.4 MG SL SUBL
0.40 | SUBLINGUAL_TABLET | SUBLINGUAL | Status: DC
Start: ? — End: 2019-03-13

## 2019-03-13 MED ORDER — INSULIN LISPRO (1 UNIT DIAL) 100 UNIT/ML (KWIKPEN)
0.00 | PEN_INJECTOR | SUBCUTANEOUS | Status: DC
Start: 2019-03-13 — End: 2019-03-13

## 2019-03-13 MED ORDER — TOBRAMYCIN 0.3 % OP SOLN
1.00 | OPHTHALMIC | Status: DC
Start: 2019-03-17 — End: 2019-03-13

## 2019-03-13 MED ORDER — OFLOXACIN 0.3 % OP SOLN
5.00 | OPHTHALMIC | Status: DC
Start: 2019-03-14 — End: 2019-03-13

## 2019-03-13 MED ORDER — DOXYCYCLINE HYCLATE 100 MG PO TABS
100.00 | ORAL_TABLET | ORAL | Status: DC
Start: 2019-03-17 — End: 2019-03-13

## 2019-03-13 MED ORDER — SODIUM CHLORIDE 0.9 % IV SOLN
INTRAVENOUS | Status: DC
Start: ? — End: 2019-03-13

## 2019-03-13 MED ORDER — MORPHINE SULFATE 4 MG/ML IJ SOLN
2.00 | INTRAMUSCULAR | Status: DC
Start: ? — End: 2019-03-13

## 2019-03-13 NOTE — Telephone Encounter (Signed)
New message   Patient's husband states that [place] on the beach. Patient's son states that he needs records sent. \ Please call to discuss.

## 2019-03-13 NOTE — Telephone Encounter (Signed)
Follow up   Per the previous message, I meant to states that patient's husband would like the medical records sent.

## 2019-03-13 NOTE — Telephone Encounter (Signed)
See other phone note by this RN - records have been sent as of 1135AM.

## 2019-03-13 NOTE — Telephone Encounter (Signed)
Carrie Palau NP talked with patient's husband - [place] collapsed [place] this [place] - rescue breathing and cpr were started. Pt returned to normal breathing after 1 minute. Pt in route to hospital. Records faxed to ER were patient is going per husbands request. Will keep [place] updated as to what they find etc.

## 2019-03-16 MED ORDER — LIDOCAINE 5 % EX PTCH
1.00 | MEDICATED_PATCH | CUTANEOUS | Status: DC
Start: 2019-03-18 — End: 2019-03-16

## 2019-03-16 MED ORDER — OFLOXACIN 0.3 % OP SOLN
5.00 | OPHTHALMIC | Status: DC
Start: 2019-03-17 — End: 2019-03-16

## 2019-03-16 MED ORDER — PROMETHAZINE HCL 25 MG/ML IJ SOLN
6.25 | INTRAMUSCULAR | Status: DC
Start: ? — End: 2019-03-16

## 2019-03-16 MED ORDER — TRAMADOL HCL 50 MG PO TABS
50.00 | ORAL_TABLET | ORAL | Status: DC
Start: ? — End: 2019-03-16

## 2019-03-16 MED ORDER — PANTOPRAZOLE SODIUM 40 MG PO TBEC
40.00 | DELAYED_RELEASE_TABLET | ORAL | Status: DC
Start: 2019-03-17 — End: 2019-03-16

## 2019-03-16 MED ORDER — DOCUSATE SODIUM 100 MG PO CAPS
100.00 | ORAL_CAPSULE | ORAL | Status: DC
Start: 2019-03-18 — End: 2019-03-16

## 2019-03-16 MED ORDER — SPIRONOLACTONE 25 MG PO TABS
25.00 | ORAL_TABLET | ORAL | Status: DC
Start: 2019-03-18 — End: 2019-03-16

## 2019-03-16 MED ORDER — GENERIC EXTERNAL MEDICATION
Status: DC
Start: ? — End: 2019-03-16

## 2019-03-16 MED ORDER — SODIUM CHLORIDE 0.9 % IV SOLN
INTRAVENOUS | Status: DC
Start: ? — End: 2019-03-16

## 2019-03-16 MED ORDER — MAGNESIUM OXIDE 400 MG PO TABS
400.00 | ORAL_TABLET | ORAL | Status: DC
Start: 2019-03-17 — End: 2019-03-16

## 2019-03-16 MED ORDER — MAGNESIUM OXIDE 400 MG PO TABS
400.00 | ORAL_TABLET | ORAL | Status: DC
Start: 2019-03-18 — End: 2019-03-16

## 2019-03-16 MED ORDER — ASPIRIN EC 81 MG PO TBEC
81.00 | DELAYED_RELEASE_TABLET | ORAL | Status: DC
Start: 2019-03-18 — End: 2019-03-16

## 2019-03-17 MED ORDER — METOPROLOL SUCCINATE ER 50 MG PO TB24
50.00 | ORAL_TABLET | ORAL | Status: DC
Start: 2019-03-18 — End: 2019-03-17

## 2019-03-17 MED ORDER — GENERIC EXTERNAL MEDICATION
3.00 | Status: DC
Start: 2019-03-17 — End: 2019-03-17

## 2019-03-17 MED ORDER — GENERIC EXTERNAL MEDICATION
Status: DC
Start: ? — End: 2019-03-17

## 2019-03-29 ENCOUNTER — Ambulatory Visit (HOSPITAL_COMMUNITY): Payer: Self-pay | Admitting: Nurse Practitioner

## 2019-09-03 ENCOUNTER — Other Ambulatory Visit (HOSPITAL_COMMUNITY): Payer: Self-pay | Admitting: Nurse Practitioner

## 2019-09-04 MED ORDER — RIVAROXABAN 20 MG PO TABS
20.00 | ORAL_TABLET | ORAL | Status: DC
Start: 2019-09-04 — End: 2019-09-04

## 2019-09-04 MED ORDER — SENNOSIDES-DOCUSATE SODIUM 8.6-50 MG PO TABS
1.00 | ORAL_TABLET | ORAL | Status: DC
Start: ? — End: 2019-09-04

## 2019-09-04 MED ORDER — METOPROLOL SUCCINATE ER 50 MG PO TB24
50.00 | ORAL_TABLET | ORAL | Status: DC
Start: 2019-09-05 — End: 2019-09-04

## 2019-09-04 MED ORDER — POTASSIUM CHLORIDE 20 MEQ PO PACK
40.00 | PACK | ORAL | Status: DC
Start: ? — End: 2019-09-04

## 2019-09-04 MED ORDER — AMIODARONE HCL 200 MG PO TABS
100.00 | ORAL_TABLET | ORAL | Status: DC
Start: 2019-09-05 — End: 2019-09-04

## 2019-09-04 MED ORDER — MAGNESIUM OXIDE 400 MG PO TABS
400.00 | ORAL_TABLET | ORAL | Status: DC
Start: ? — End: 2019-09-04

## 2019-09-04 MED ORDER — ONDANSETRON HCL 4 MG/2ML IJ SOLN
8.00 | INTRAMUSCULAR | Status: DC
Start: ? — End: 2019-09-04

## 2019-09-04 MED ORDER — ACETAMINOPHEN 325 MG PO TABS
650.00 | ORAL_TABLET | ORAL | Status: DC
Start: ? — End: 2019-09-04

## 2019-09-04 MED ORDER — SODIUM CHLORIDE FLUSH 0.9 % IV SOLN
3.00 | INTRAVENOUS | Status: DC
Start: 2019-09-04 — End: 2019-09-04

## 2019-09-04 MED ORDER — OXYCODONE-ACETAMINOPHEN 5-325 MG PO TABS
2.00 | ORAL_TABLET | ORAL | Status: DC
Start: ? — End: 2019-09-04

## 2019-09-04 MED ORDER — OXYBUTYNIN CHLORIDE 5 MG PO TABS
5.00 | ORAL_TABLET | ORAL | Status: DC
Start: 2019-09-04 — End: 2019-09-04

## 2019-09-04 MED ORDER — MAGNESIUM HYDROXIDE 2400 MG/10ML PO SUSP
10.00 | ORAL | Status: DC
Start: ? — End: 2019-09-04

## 2019-09-04 MED ORDER — LEVOTHYROXINE SODIUM 100 MCG PO TABS
100.00 | ORAL_TABLET | ORAL | Status: DC
Start: 2019-09-05 — End: 2019-09-04

## 2019-09-04 MED ORDER — BISACODYL 10 MG RE SUPP
10.00 | RECTAL | Status: DC
Start: ? — End: 2019-09-04

## 2019-09-04 MED ORDER — SODIUM CHLORIDE FLUSH 0.9 % IV SOLN
10.00 | INTRAVENOUS | Status: DC
Start: 2019-09-04 — End: 2019-09-04

## 2019-09-04 MED ORDER — LACTULOSE 10 GM/15ML PO SOLN
20.00 | ORAL | Status: DC
Start: ? — End: 2019-09-04

## 2019-09-04 MED ORDER — VALACYCLOVIR HCL 500 MG PO TABS
500.00 | ORAL_TABLET | ORAL | Status: DC
Start: ? — End: 2019-09-04

## 2019-09-04 MED ORDER — POTASSIUM CHLORIDE CRYS ER 20 MEQ PO TBCR
20.00 | EXTENDED_RELEASE_TABLET | ORAL | Status: DC
Start: 2019-09-05 — End: 2019-09-04

## 2019-09-04 MED ORDER — TRAMADOL HCL 50 MG PO TABS
100.00 | ORAL_TABLET | ORAL | Status: DC
Start: ? — End: 2019-09-04

## 2019-09-04 MED ORDER — MAGNESIUM OXIDE 400 MG PO TABS
400.00 | ORAL_TABLET | ORAL | Status: DC
Start: 2019-09-04 — End: 2019-09-04

## 2019-09-04 MED ORDER — MORPHINE SULFATE (PF) 2 MG/ML IV SOLN
2.00 | INTRAVENOUS | Status: DC
Start: ? — End: 2019-09-04

## 2019-09-04 MED ORDER — POLYETHYLENE GLYCOL 3350 17 GM/SCOOP PO POWD
17.00 | ORAL | Status: DC
Start: ? — End: 2019-09-04

## 2019-11-24 ENCOUNTER — Other Ambulatory Visit (HOSPITAL_COMMUNITY): Payer: Self-pay | Admitting: Nurse Practitioner

## 2019-12-26 ENCOUNTER — Other Ambulatory Visit (HOSPITAL_COMMUNITY): Payer: Self-pay | Admitting: Nurse Practitioner
